# Patient Record
Sex: Female | Born: 1940
Health system: Southern US, Community
[De-identification: ages and names within clinical notes are randomized; demographics above are authoritative.]

## PROBLEM LIST (undated history)

## (undated) DIAGNOSIS — G20A1 Parkinson's disease without dyskinesia, without mention of fluctuations: Secondary | ICD-10-CM

## (undated) DIAGNOSIS — M159 Polyosteoarthritis, unspecified: Secondary | ICD-10-CM

## (undated) DIAGNOSIS — F0390 Unspecified dementia without behavioral disturbance: Secondary | ICD-10-CM

## (undated) DIAGNOSIS — I10 Essential (primary) hypertension: Secondary | ICD-10-CM

## (undated) DIAGNOSIS — K219 Gastro-esophageal reflux disease without esophagitis: Secondary | ICD-10-CM

## (undated) DIAGNOSIS — G20 Parkinson's disease: Secondary | ICD-10-CM

## (undated) DIAGNOSIS — H409 Unspecified glaucoma: Secondary | ICD-10-CM

## (undated) HISTORY — DX: Essential (primary) hypertension: I10

## (undated) HISTORY — DX: Unspecified glaucoma: H40.9

## (undated) HISTORY — DX: Unspecified dementia, unspecified severity, without behavioral disturbance, psychotic disturbance, mood disturbance, and anxiety: F03.90

## (undated) HISTORY — DX: Gastro-esophageal reflux disease without esophagitis: K21.9

## (undated) HISTORY — DX: Polyosteoarthritis, unspecified: M15.9

---

## 2011-03-13 ENCOUNTER — Ambulatory Visit: Payer: Self-pay | Admitting: Otolaryngology

## 2011-10-09 ENCOUNTER — Ambulatory Visit: Payer: Self-pay | Admitting: Otolaryngology

## 2011-10-11 ENCOUNTER — Ambulatory Visit: Payer: Self-pay | Admitting: Otolaryngology

## 2011-10-11 LAB — TSH: Thyroid Stimulating Horm: 0.368 u[IU]/mL — ABNORMAL LOW

## 2011-10-11 LAB — T4, FREE: Free Thyroxine: 0.98 ng/dL (ref 0.76–1.46)

## 2011-12-09 ENCOUNTER — Ambulatory Visit: Payer: Self-pay | Admitting: Family Medicine

## 2012-05-14 ENCOUNTER — Ambulatory Visit: Payer: Self-pay | Admitting: Otolaryngology

## 2012-05-14 LAB — TSH: Thyroid Stimulating Horm: 0.723 u[IU]/mL

## 2012-11-03 ENCOUNTER — Ambulatory Visit: Payer: Self-pay

## 2012-11-03 LAB — COMPREHENSIVE METABOLIC PANEL
Albumin: 4 g/dL (ref 3.4–5.0)
Alkaline Phosphatase: 71 U/L (ref 50–136)
Anion Gap: 7 (ref 7–16)
BUN: 16 mg/dL (ref 7–18)
Bilirubin,Total: 0.6 mg/dL (ref 0.2–1.0)
Calcium, Total: 9 mg/dL (ref 8.5–10.1)
Chloride: 103 mmol/L (ref 98–107)
Co2: 32 mmol/L (ref 21–32)
Creatinine: 0.87 mg/dL (ref 0.60–1.30)
EGFR (African American): >60
EGFR (Non-African Amer.): >60
Potassium: 3.9 mmol/L (ref 3.5–5.1)
Sodium: 142 mmol/L (ref 136–145)

## 2012-11-03 LAB — CBC WITH DIFFERENTIAL/PLATELET
Basophil #: 0 10*3/uL (ref 0.0–0.1)
Basophil %: 0.4 %
Eosinophil %: 0.3 %
Lymphocyte %: 8.8 %
MCH: 31.4 pg (ref 26.0–34.0)
MCHC: 34.2 g/dL (ref 32.0–36.0)
Monocyte %: 2.3 %
Neutrophil #: 9.2 10*3/uL — ABNORMAL HIGH (ref 1.4–6.5)
Platelet: 176 10*3/uL (ref 150–440)
RBC: 4.68 10*6/uL (ref 3.80–5.20)
RDW: 13.5 % (ref 11.5–14.5)
WBC: 10.5 10*3/uL (ref 3.6–11.0)

## 2012-11-03 LAB — URINALYSIS, COMPLETE
Bilirubin,UR: NEGATIVE
Blood: NEGATIVE
Glucose,UR: NEGATIVE mg/dL (ref 0–75)
Ketone: NEGATIVE
Nitrite: NEGATIVE
Ph: 8 (ref 4.5–8.0)
Protein: 30
Specific Gravity: 1.01 (ref 1.003–1.030)

## 2012-11-03 LAB — AMYLASE: Amylase: 90 U/L (ref 25–115)

## 2012-11-03 LAB — OCCULT BLOOD X 1 CARD TO LAB, STOOL: Occult Blood, Feces: NEGATIVE

## 2012-11-03 LAB — LIPASE, BLOOD: Lipase: 226 U/L (ref 73–393)

## 2013-11-01 ENCOUNTER — Ambulatory Visit: Payer: Self-pay | Admitting: Emergency Medicine

## 2013-11-01 LAB — CBC WITH DIFFERENTIAL/PLATELET
Basophil #: 0 10*3/uL (ref 0.0–0.1)
Basophil %: 0.4 %
Eosinophil #: 0 10*3/uL (ref 0.0–0.7)
Eosinophil %: 0.4 %
HCT: 42.6 % (ref 35.0–47.0)
HGB: 14.6 g/dL (ref 12.0–16.0)
Lymphocyte #: 1.3 10*3/uL (ref 1.0–3.6)
Lymphocyte %: 14.4 %
MCH: 32 pg (ref 26.0–34.0)
MCHC: 34.2 g/dL (ref 32.0–36.0)
MCV: 94 fL (ref 80–100)
Monocyte #: 0.4 x10 3/mm (ref 0.2–0.9)
Monocyte %: 4.8 %
Neutrophil #: 7.4 10*3/uL — ABNORMAL HIGH (ref 1.4–6.5)
Neutrophil %: 80 %
Platelet: 185 10*3/uL (ref 150–440)
RBC: 4.54 10*6/uL (ref 3.80–5.20)
RDW: 13.5 % (ref 11.5–14.5)
WBC: 9.3 10*3/uL (ref 3.6–11.0)

## 2013-11-01 LAB — URINALYSIS, COMPLETE
Bilirubin,UR: NEGATIVE
GLUCOSE, UR: NEGATIVE mg/dL (ref 0–75)
Ketone: NEGATIVE
Nitrite: NEGATIVE
PH: 6 (ref 4.5–8.0)
Protein: 300
SQUAMOUS EPITHELIAL: NONE SEEN
Specific Gravity: >=1.03 (ref 1.003–1.030)
WBC UR: >30 /HPF (ref 0–5)

## 2013-11-01 LAB — BASIC METABOLIC PANEL
Anion Gap: 7 (ref 7–16)
BUN: 18 mg/dL (ref 7–18)
Calcium, Total: 9.3 mg/dL (ref 8.5–10.1)
Chloride: 103 mmol/L (ref 98–107)
Co2: 32 mmol/L (ref 21–32)
Creatinine: 0.88 mg/dL (ref 0.60–1.30)
EGFR (African American): >60
EGFR (Non-African Amer.): >60
Glucose: 106 mg/dL — ABNORMAL HIGH (ref 65–99)
Osmolality: 285 (ref 275–301)
Potassium: 3.7 mmol/L (ref 3.5–5.1)
Sodium: 142 mmol/L (ref 136–145)

## 2013-11-03 LAB — URINE CULTURE

## 2014-03-12 ENCOUNTER — Ambulatory Visit: Payer: Self-pay | Admitting: Medical

## 2014-03-12 LAB — URINALYSIS, COMPLETE
Bilirubin,UR: NEGATIVE
GLUCOSE, UR: NEGATIVE
KETONE: NEGATIVE
Nitrite: NEGATIVE
Ph: 7 (ref 5.0–8.0)
Protein: 30
Specific Gravity: 1.02 (ref 1.000–1.030)

## 2014-03-13 LAB — URINE CULTURE

## 2015-08-10 DIAGNOSIS — H401121 Primary open-angle glaucoma, left eye, mild stage: Secondary | ICD-10-CM | POA: Diagnosis not present

## 2015-08-10 DIAGNOSIS — H401112 Primary open-angle glaucoma, right eye, moderate stage: Secondary | ICD-10-CM | POA: Diagnosis not present

## 2015-09-14 DIAGNOSIS — R634 Abnormal weight loss: Secondary | ICD-10-CM | POA: Diagnosis not present

## 2015-09-14 DIAGNOSIS — K219 Gastro-esophageal reflux disease without esophagitis: Secondary | ICD-10-CM | POA: Insufficient documentation

## 2015-11-30 DIAGNOSIS — H401112 Primary open-angle glaucoma, right eye, moderate stage: Secondary | ICD-10-CM | POA: Diagnosis not present

## 2015-11-30 DIAGNOSIS — H401121 Primary open-angle glaucoma, left eye, mild stage: Secondary | ICD-10-CM | POA: Diagnosis not present

## 2016-02-29 ENCOUNTER — Observation Stay
Admission: EM | Admit: 2016-02-29 | Discharge: 2016-03-02 | Disposition: A | Discharge status: Home / Self Care | Payer: Medicare Other | Attending: Internal Medicine | Admitting: Internal Medicine

## 2016-02-29 ENCOUNTER — Emergency Department: Payer: Medicare Other

## 2016-02-29 ENCOUNTER — Encounter: Payer: Self-pay | Admitting: Emergency Medicine

## 2016-02-29 DIAGNOSIS — Z818 Family history of other mental and behavioral disorders: Secondary | ICD-10-CM | POA: Insufficient documentation

## 2016-02-29 DIAGNOSIS — Z9102 Food additives allergy status: Secondary | ICD-10-CM | POA: Diagnosis not present

## 2016-02-29 DIAGNOSIS — I951 Orthostatic hypotension: Principal | ICD-10-CM | POA: Insufficient documentation

## 2016-02-29 DIAGNOSIS — R55 Syncope and collapse: Secondary | ICD-10-CM | POA: Diagnosis present

## 2016-02-29 DIAGNOSIS — I10 Essential (primary) hypertension: Secondary | ICD-10-CM | POA: Diagnosis not present

## 2016-02-29 DIAGNOSIS — Z886 Allergy status to analgesic agent status: Secondary | ICD-10-CM | POA: Insufficient documentation

## 2016-02-29 DIAGNOSIS — Z91013 Allergy to seafood: Secondary | ICD-10-CM | POA: Diagnosis not present

## 2016-02-29 DIAGNOSIS — Z825 Family history of asthma and other chronic lower respiratory diseases: Secondary | ICD-10-CM | POA: Diagnosis not present

## 2016-02-29 DIAGNOSIS — Z888 Allergy status to other drugs, medicaments and biological substances status: Secondary | ICD-10-CM | POA: Insufficient documentation

## 2016-02-29 DIAGNOSIS — K219 Gastro-esophageal reflux disease without esophagitis: Secondary | ICD-10-CM | POA: Diagnosis not present

## 2016-02-29 DIAGNOSIS — N39498 Other specified urinary incontinence: Secondary | ICD-10-CM | POA: Insufficient documentation

## 2016-02-29 DIAGNOSIS — R42 Dizziness and giddiness: Secondary | ICD-10-CM | POA: Diagnosis present

## 2016-02-29 DIAGNOSIS — Z8349 Family history of other endocrine, nutritional and metabolic diseases: Secondary | ICD-10-CM | POA: Diagnosis not present

## 2016-02-29 DIAGNOSIS — H409 Unspecified glaucoma: Secondary | ICD-10-CM | POA: Diagnosis not present

## 2016-02-29 DIAGNOSIS — E860 Dehydration: Secondary | ICD-10-CM | POA: Diagnosis not present

## 2016-02-29 LAB — CBC WITH DIFFERENTIAL/PLATELET
BASOS ABS: 0 10*3/uL (ref 0–0.1)
Basophils Relative: 0 %
Eosinophils Absolute: 0 10*3/uL (ref 0–0.7)
Eosinophils Relative: 0 %
HEMATOCRIT: 43.3 % (ref 35.0–47.0)
Hemoglobin: 15.1 g/dL (ref 12.0–16.0)
LYMPHS ABS: 1.8 10*3/uL (ref 1.0–3.6)
LYMPHS PCT: 19 %
MCH: 32.4 pg (ref 26.0–34.0)
MCHC: 34.8 g/dL (ref 32.0–36.0)
MCV: 93.1 fL (ref 80.0–100.0)
MONO ABS: 0.3 10*3/uL (ref 0.2–0.9)
Monocytes Relative: 3 %
NEUTROS ABS: 7.4 10*3/uL — AB (ref 1.4–6.5)
Neutrophils Relative %: 78 %
Platelets: 211 10*3/uL (ref 150–440)
RBC: 4.65 MIL/uL (ref 3.80–5.20)
RDW: 13.1 % (ref 11.5–14.5)
WBC: 9.6 10*3/uL (ref 3.6–11.0)

## 2016-02-29 LAB — BASIC METABOLIC PANEL
Anion gap: 9 (ref 5–15)
BUN: 19 mg/dL (ref 6–20)
CHLORIDE: 105 mmol/L (ref 101–111)
CO2: 27 mmol/L (ref 22–32)
CREATININE: 0.77 mg/dL (ref 0.44–1.00)
Calcium: 10.4 mg/dL — ABNORMAL HIGH (ref 8.9–10.3)
GFR calc Af Amer: >60 mL/min (ref >60)
GFR calc non Af Amer: >60 mL/min (ref >60)
GLUCOSE: 124 mg/dL — AB (ref 65–99)
POTASSIUM: 4.1 mmol/L (ref 3.5–5.1)
Sodium: 141 mmol/L (ref 135–145)

## 2016-02-29 LAB — URINALYSIS COMPLETE WITH MICROSCOPIC (ARMC ONLY)
BACTERIA UA: NONE SEEN
BILIRUBIN URINE: NEGATIVE
Glucose, UA: NEGATIVE mg/dL
Hgb urine dipstick: NEGATIVE
Ketones, ur: NEGATIVE mg/dL
LEUKOCYTES UA: NEGATIVE
Nitrite: NEGATIVE
PH: 9 — AB (ref 5.0–8.0)
PROTEIN: NEGATIVE mg/dL
Specific Gravity, Urine: 1.008 (ref 1.005–1.030)

## 2016-02-29 LAB — TROPONIN I: Troponin I: <0.03 ng/mL (ref <0.03)

## 2016-02-29 LAB — GLUCOSE, CAPILLARY: GLUCOSE-CAPILLARY: 168 mg/dL — AB (ref 65–99)

## 2016-02-29 LAB — ETHANOL

## 2016-02-29 MED ORDER — ONDANSETRON HCL 4 MG/2ML IJ SOLN
4.0000 mg | Freq: Four times a day (QID) | INTRAMUSCULAR | Status: DC | PRN
Start: 2016-02-29 — End: 2016-03-02

## 2016-02-29 MED ORDER — ONDANSETRON HCL 4 MG/2ML IJ SOLN
4.0000 mg | Freq: Once | INTRAMUSCULAR | Status: AC
  Administered 2016-02-29: 4 mg via INTRAVENOUS

## 2016-02-29 MED ORDER — HEPARIN SODIUM (PORCINE) 5000 UNIT/ML IJ SOLN
5000.0000 [IU] | Freq: Three times a day (TID) | INTRAMUSCULAR | Status: DC
  Administered 2016-02-29 – 2016-03-02 (×5): 5000 [IU] via SUBCUTANEOUS
  Filled 2016-02-29 (×5): qty 1

## 2016-02-29 MED ORDER — ONDANSETRON HCL 4 MG PO TABS: 4.0000 mg | ORAL_TABLET | Freq: Four times a day (QID) | ORAL | Status: DC | PRN

## 2016-02-29 MED ORDER — SODIUM CHLORIDE 0.9 % IV BOLUS (SEPSIS)
1000.0000 mL | Freq: Once | INTRAVENOUS | Status: AC
  Administered 2016-02-29: 1000 mL via INTRAVENOUS

## 2016-02-29 MED ORDER — ACETAMINOPHEN 650 MG RE SUPP: 650.0000 mg | Freq: Four times a day (QID) | RECTAL | Status: DC | PRN

## 2016-02-29 MED ORDER — LATANOPROST 0.005 % OP SOLN: 1.0000 [drp] | Freq: Every day | OPHTHALMIC | Status: DC

## 2016-02-29 MED ORDER — TRAZODONE HCL 50 MG PO TABS
25.0000 mg | ORAL_TABLET | Freq: Every evening | ORAL | Status: DC | PRN
  Filled 2016-02-29: qty 1

## 2016-02-29 MED ORDER — DORZOLAMIDE HCL-TIMOLOL MAL 2-0.5 % OP SOLN
1.0000 [drp] | Freq: Two times a day (BID) | OPHTHALMIC | Status: DC
  Administered 2016-03-02: 1 [drp] via OPHTHALMIC
  Filled 2016-02-29: qty 10

## 2016-02-29 MED ORDER — ONDANSETRON HCL 4 MG/2ML IJ SOLN
INTRAMUSCULAR | Status: AC
  Administered 2016-02-29: 4 mg via INTRAVENOUS
  Filled 2016-02-29: qty 2

## 2016-02-29 MED ORDER — ACETAMINOPHEN 325 MG PO TABS
650.0000 mg | ORAL_TABLET | Freq: Four times a day (QID) | ORAL | Status: DC | PRN
  Administered 2016-02-29 – 2016-03-01 (×2): 650 mg via ORAL
  Filled 2016-02-29 (×2): qty 2

## 2016-02-29 MED ORDER — DOCUSATE SODIUM 100 MG PO CAPS
100.0000 mg | ORAL_CAPSULE | Freq: Two times a day (BID) | ORAL | Status: DC
  Filled 2016-02-29 (×4): qty 1

## 2016-02-29 MED ORDER — BISACODYL 5 MG PO TBEC: 5.0000 mg | DELAYED_RELEASE_TABLET | Freq: Every day | ORAL | Status: DC | PRN

## 2016-02-29 MED ORDER — SODIUM CHLORIDE 0.9 % IV SOLN
INTRAVENOUS | Status: DC
  Administered 2016-02-29: 23:00:00 via INTRAVENOUS

## 2016-02-29 MED ORDER — HYDROCODONE-ACETAMINOPHEN 5-325 MG PO TABS: 1.0000 | ORAL_TABLET | ORAL | Status: DC | PRN

## 2016-02-29 NOTE — ED Provider Notes (Addendum)
[facility] Emergency Department Provider Note  ____________________________________________  Time seen: Approximately 3:21 PM  I have reviewed the triage vital signs and the nursing notes.   HISTORY  Chief Complaint Loss of Consciousness    HPI Cheryl Cummings is a 75 y.o. female comes to the ED due to syncope at home. She reports that she remembers she was seated on the floor brushing her cat, And then she woke up lying on her face on her glasses.She denies any preceding symptoms such as headache chest pain back pain abdominal pain or vomiting. She denies headache chest pain abdominal pain or shortness of breath now. She only reports some nausea and feeling very fatigued. She was incontinent of urine during the syncope at home. Here in the ED she reported that she felt like she needed to urinate again, and she had a syncopal episode on the toilet while nurses were assisting her. She did not fall or hit her head.     History reviewed. No pertinent past medical history. None  There are no active problems to display for this patient.    History reviewed. No pertinent surgical history. None  Prior to Admission medications   Medication Sig Start Date End Date Taking? Authorizing Provider  dorzolamide-timolol (COSOPT) 22.3-6.8 MG/ML ophthalmic solution Place 1 drop into both eyes 2 (two) times daily.   Yes Historical Provider, MD  TRAVATAN Z 0.004 % SOLN ophthalmic solution Place 1 drop into both eyes at bedtime.   Yes Historical Provider, MD  None Allergies Ibuprofen; Mushroom extract complex; Valium [diazepam]; and Shellfish allergy   History reviewed. No pertinent family history.  Social History Social History  Substance Use Topics  . Smoking status: Never Smoker  . Smokeless tobacco: Never Used  . Alcohol use No  No tobacco alcohol or drug use  Review of Systems  Constitutional:   No fever or chills.  ENT:   No sore throat. No  rhinorrhea. Cardiovascular:   No chest pain. Respiratory:   No dyspnea or cough. Gastrointestinal:   Negative for abdominal pain, vomiting and diarrhea.  Genitourinary:   Negative for dysuria or difficulty urinating. Musculoskeletal:   Negative for focal pain or swelling Neurological:   Negative for headaches 10-point ROS otherwise negative.  ____________________________________________   PHYSICAL EXAM:  VITAL SIGNS: ED Triage Vitals  Enc Vitals Group     BP      Pulse      Resp      Temp      Temp src      SpO2      Weight      Height      Head Circumference      Peak Flow      Pain Score      Pain Loc      Pain Edu?      Excl. in GC?   Blood pressure 215/110 Heart rate 60 Oxygen saturation 100% Respiratory rate 18  Vital signs reviewed, nursing assessments reviewed.   Constitutional:   Alert and oriented. Well appearing and in no distress. Eyes:   No scleral icterus. No conjunctival pallor. PERRL. EOMI.  No nystagmus. ENT   Head:   Normocephalic and atraumatic.   Nose:   No congestion/rhinnorhea. No septal hematoma   Mouth/Throat:   Dry mucous membranes, no pharyngeal erythema. No peritonsillar mass.    Neck:   No stridor. No SubQ emphysema. No meningismus. Hematological/Lymphatic/Immunilogical:   No cervical lymphadenopathy. Cardiovascular:   RRR.  Symmetric bilateral radial and DP pulses.  No murmurs.  Respiratory:   Normal respiratory effort without tachypnea nor retractions. Breath sounds are clear and equal bilaterally. No wheezes/rales/rhonchi. Gastrointestinal:   Soft and nontender. Non distended. There is no CVA tenderness.  No rebound, rigidity, or guarding. Genitourinary:   deferred Musculoskeletal:   Nontender with normal range of motion in all extremities. No joint effusions.  No lower extremity tenderness.  No edema. Neurologic:   Normal speech and language.  CN 2-10 normal. Motor grossly intact. No gross focal neurologic deficits are  appreciated.  Skin:    Skin is warm, dry and intact. No rash noted.  No petechiae, purpura, or bullae.  ____________________________________________    LABS (pertinent positives/negatives) (all labs ordered are listed, but only abnormal results are displayed) Labs Reviewed  BASIC METABOLIC PANEL - Abnormal; Notable for the following:       Result Value   Glucose, Bld 124 (*)    Calcium 10.4 (*)    All other components within normal limits  CBC WITH DIFFERENTIAL/PLATELET - Abnormal; Notable for the following:    Neutro Abs 7.4 (*)    All other components within normal limits  URINALYSIS COMPLETEWITH MICROSCOPIC ([facility] ONLY) - Abnormal; Notable for the following:    Color, Urine YELLOW (*)    APPearance CLOUDY (*)    pH 9.0 (*)    Squamous Epithelial / LPF 0-5 (*)    All other components within normal limits  ETHANOL   ____________________________________________   EKG  Interpreted by me Normal sinus rhythm rate of 66. Normal axis intervals QRS ST segments and T waves.  ____________________________________________    RADIOLOGY  CT head unremarkable  ____________________________________________   PROCEDURES Procedures  ____________________________________________   INITIAL IMPRESSION / ASSESSMENT AND PLAN / ED COURSE  Pertinent labs & imaging results that were available during my care of the patient were reviewed by me and considered in my medical decision making (see chart for details).  Syncope, possible new onset seizure. Severe hypertension and nausea are the only significant findings on exam and by history. We'll get CT head to evaluate for intracranial hemorrhage versus structural lesion, check labs EKG, give IV fluids and Zofran.   ----------------------------------------- 6:47 PM on 02/29/2016 -----------------------------------------  Patient not feeling better and in fact feeling worse. When attempting to ambulate the patient feels very dizzy and  becomes near syncopal again. She also is very low energy.  We'll discuss with hospitalist for further evaluation. Continue IV fluids    Clinical Course   ____________________________________________   FINAL CLINICAL IMPRESSION(S) / ED DIAGNOSES  Final diagnoses:  Syncope, unspecified syncope type  Dehydration       Portions of this note were generated with dragon dictation software. Dictation errors may occur despite best attempts at proofreading.    Cheryl CheekPhillip Jkayla Spiewak, MD 02/29/16 1847    Cheryl CheekPhillip Karinna Beadles, MD 02/29/16 [phone]

## 2016-02-29 NOTE — ED Notes (Signed)
Report given to floor RN. Pt taken to floor via stretcher. Vital signs stable prior to transport.  

## 2016-02-29 NOTE — ED Triage Notes (Signed)
Pt arrived via EMS from home. Pt sitting on the floor brushing her cat and the next thing she knew she was laying on the floor partially laying on her glasses. Pt unsure how long she was unconscious or what happened, pt did have loss of bladder control. Pt was able to get herself off the floor, changed and called 911. Upon arrival to ER pt has no complaints, alert and oriented. Just after to ER, pt needed to use bathroom, helped to in room toilet by two RNs. After using bathroom, pt stood up, stated that she felt dizzy, sat back down on toilet, then became unresponsive but was still breathing. With assistance of three RNs, pt moved onto stretcher and MD notified. After several minutes, pt came back around and stated that she felt like she was going to pass out. MD to bedside to assess pt, pt alert and oriented, denies any pain, just states, "I feel drained."

## 2016-02-29 NOTE — H&P (Signed)
[facility] [place] [facility]   PATIENT NAME: Cheryl Cummings    MR#:  [id number]  DATE OF BIRTH:  08-22-1940  DATE OF ADMISSION:  02/29/2016  PRIMARY CARE PHYSICIAN: [facility]   REQUESTING/REFERRING PHYSICIAN;Dr.Philip Scotty CourtStafford  CHIEF COMPLAINT;syncope   Chief Complaint  Patient presents with  . Loss of Consciousness    HISTORY OF PRESENT ILLNESS:  Cheryl Cummings  is a 75 y.o. female with no ST medical problems comes in because of syncope. Patient passed out at home while she was brushing the cat, she called her daughter and she called ambulance. In the emergency room she passed out again. No chest pain or dizziness. Patient has fatigue, nausea. Patient had episodes of syncope and  incontinence at home. No recent illnesses. No appetite problems. But the daughter mentioned that she is losing a lot of weight recently. Patient lives alone.   PAST MEDICAL HISTORY:  History reviewed. No pertinent past medical history.  PAST SURGICAL HISTOIRY:  History reviewed. No pertinent surgical history.  SOCIAL HISTORY:   Social History  Substance Use Topics  . Smoking status: Never Smoker  . Smokeless tobacco: Never Used  . Alcohol use No    FAMILY HISTORY:  History reviewed. No pertinent family history.  DRUG ALLERGIES:   Allergies  Allergen Reactions  . Ibuprofen Swelling  . Mushroom Extract Complex Nausea And Vomiting  . Valium [Diazepam] Other (See Comments)    Unknown   . Shellfish Allergy Rash    REVIEW OF SYSTEMS:  CONSTITUTIONAL: No fever, fatigue or weakness. appears malnourished. EYES: No blurred or double vision.  EARS, NOSE, AND THROAT: No tinnitus or ear pain.  RESPIRATORY: No cough, shortness of breath, wheezing or hemoptysis.  CARDIOVASCULAR: No chest pain, orthopnea, edema.  GASTROINTESTINAL: No nausea, vomiting, diarrhea or abdominal pain.  GENITOURINARY: No dysuria, hematuria.  ENDOCRINE: No polyuria,  nocturia,  HEMATOLOGY: No anemia, easy bruising or bleeding SKIN: No rash or lesion. MUSCULOSKELETAL: No joint pain or arthritis.   NEUROLOGIC: No tingling, numbness, weakness.  PSYCHIATRY: No anxiety or depression.   MEDICATIONS AT HOME:   Prior to Admission medications   Medication Sig Start Date End Date Taking? Authorizing Provider  dorzolamide-timolol (COSOPT) 22.3-6.8 MG/ML ophthalmic solution Place 1 drop into both eyes 2 (two) times daily.   Yes Historical Provider, MD  TRAVATAN Z 0.004 % SOLN ophthalmic solution Place 1 drop into both eyes at bedtime.   Yes Historical Provider, MD      VITAL SIGNS:  Blood pressure 136/75, pulse 92, temperature 98.1 F (36.7 C), temperature source Oral, resp. rate 13, height 5\' 4"  (1.626 m), weight 48.1 kg (106 lb), SpO2 100 %.  PHYSICAL EXAMINATION:  GENERAL:  75 y.o.-year-old patient lying in the bed with no acute distress.  EYES: Pupils equal, round, reactive to light and accommodation. No scleral icterus. Extraocular muscles intact.  HEENT: Head atraumatic, normocephalic. Oropharynx and nasopharynx clear.  NECK:  Supple, no jugular venous distention. No thyroid enlargement, no tenderness.  LUNGS: Normal breath sounds bilaterally, no wheezing, rales,rhonchi or crepitation. No use of accessory muscles of respiration.  CARDIOVASCULAR: S1, S2 normal. No murmurs, rubs, or gallops.  ABDOMEN: Soft, nontender, nondistended. Bowel sounds present. No organomegaly or mass.  EXTREMITIES: No pedal edema, cyanosis, or clubbing.  NEUROLOGIC: Cranial nerves II through XII are intact. Muscle strength 5/5 in all extremities. Sensation intact. Gait not checked.  PSYCHIATRIC: The patient is alert and oriented x 3.  SKIN: No obvious rash,  lesion, or ulcer.   LABORATORY PANEL:   CBC  Recent Labs Lab 02/29/16 1526  WBC 9.6  HGB 15.1  HCT 43.3  PLT 211    ------------------------------------------------------------------------------------------------------------------  Chemistries   Recent Labs Lab 02/29/16 1526  NA 141  K 4.1  CL 105  CO2 27  GLUCOSE 124*  BUN 19  CREATININE 0.77  CALCIUM 10.4*   ------------------------------------------------------------------------------------------------------------------  Cardiac Enzymes No results for input(s): TROPONINI in the last 168 hours. ------------------------------------------------------------------------------------------------------------------  RADIOLOGY:  Ct Head Wo Contrast  Result Date: 02/29/2016 CLINICAL DATA:  Syncopal episode. EXAM: CT HEAD WITHOUT CONTRAST TECHNIQUE: Contiguous axial images were obtained from the base of the skull through the vertex without intravenous contrast. COMPARISON:  None. FINDINGS: Brain: Moderate diffuse periventricular and subcortical white matter hypoattenuation is present bilaterally. Basal ganglia are intact. Insular ribbon is normal. No focal cortical lesions are present. No acute infarct, hemorrhage, or mass lesion is present. The ventricles are of normal size. No significant extraaxial fluid collection is present. Vascular: Atherosclerotic calcifications are noted within the precavernous and cavernous internal carotid arteries bilaterally. No hyperdense Skull: Calvarium is within normal limits. Skullbase is unremarkable. Sinuses/Orbits: The paranasal sinuses are clear. The mastoid air cells are clear. The globes and orbits are intact. Other: No significant extracranial soft tissue lesions are present. IMPRESSION: 1. Moderate diffuse periventricular and subcortical white matter hypoattenuation bilaterally likely reflects the sequela of chronic microvascular ischemia. 2. No acute intracranial abnormality. Electronically Signed   By: Marin Robertshristopher  Mattern M.D.   On: 02/29/2016 16:44    EKG:   Orders placed or performed during the hospital  encounter of 02/29/16  . ED EKG  . ED EKG  . EKG 12-Lead  . EKG 12-Lead    Sinus tachycardia with some PVCs 90 bpm    IMPRESSION AND PLAN:  Recurrent syncope UNCLEAR REASON: EVALUATE FOR CARDIAC ARRHYTHMIA,vs seizure vs orthostatic hypotension?  Admit, start  IV hydration, check echo, carotid  ultrasound, check orthostatic vitals. Cardiology consult requested because of recurrent syncope, neurology consult also requested because of recurrent syncope. Discussed this with patient's family.    Weight loss,possible malnutrition;consult nutritionist PT consult due to syncope    All the records are reviewed and case discussed with ED provider. Management plans discussed with the patient, family and they are in agreement.  CODE STATUS:full  TOTAL TIME TAKING CARE OF THIS PATIENT: 55minutes.    Katha HammingKONIDENA,Clariece Roesler M.D on 02/29/2016 at 8:36 PM  Between 7am to 6pm - Pager - [phone]  After 6pm go to [email or web address] - password [email or web address]  [facility]  Office  [phone]  CC: Primary care physician; [facility]  Note: This dictation was prepared with Dragon dictation along with smaller phrase technology. Any transcriptional errors that result from this process are unintentional.

## 2016-03-01 ENCOUNTER — Observation Stay: Payer: Medicare Other

## 2016-03-01 ENCOUNTER — Observation Stay (HOSPITAL_COMMUNITY): Payer: Medicare Other

## 2016-03-01 ENCOUNTER — Observation Stay
Admit: 2016-03-01 | Discharge: 2016-03-01 | Disposition: A | Discharge status: Home / Self Care | Payer: Medicare Other | Attending: Internal Medicine | Admitting: Internal Medicine

## 2016-03-01 DIAGNOSIS — R55 Syncope and collapse: Secondary | ICD-10-CM

## 2016-03-01 DIAGNOSIS — I951 Orthostatic hypotension: Secondary | ICD-10-CM | POA: Diagnosis not present

## 2016-03-01 LAB — BASIC METABOLIC PANEL
ANION GAP: 6 (ref 5–15)
BUN: 17 mg/dL (ref 6–20)
CALCIUM: 9.1 mg/dL (ref 8.9–10.3)
CO2: 29 mmol/L (ref 22–32)
Chloride: 106 mmol/L (ref 101–111)
Creatinine, Ser: 0.64 mg/dL (ref 0.44–1.00)
GLUCOSE: 93 mg/dL (ref 65–99)
POTASSIUM: 3.5 mmol/L (ref 3.5–5.1)
Sodium: 141 mmol/L (ref 135–145)

## 2016-03-01 LAB — CBC
HEMATOCRIT: 38.3 % (ref 35.0–47.0)
HEMOGLOBIN: 13.5 g/dL (ref 12.0–16.0)
MCH: 32.9 pg (ref 26.0–34.0)
MCHC: 35.3 g/dL (ref 32.0–36.0)
MCV: 93.1 fL (ref 80.0–100.0)
Platelets: 173 10*3/uL (ref 150–440)
RBC: 4.11 MIL/uL (ref 3.80–5.20)
RDW: 13.1 % (ref 11.5–14.5)
WBC: 6.5 10*3/uL (ref 3.6–11.0)

## 2016-03-01 LAB — TROPONIN I

## 2016-03-01 LAB — ECHOCARDIOGRAM COMPLETE
HEIGHTINCHES: 64 in
Weight: 1646.4 oz

## 2016-03-01 MED ORDER — GADOBENATE DIMEGLUMINE 529 MG/ML IV SOLN
10.0000 mL | Freq: Once | INTRAVENOUS | Status: AC | PRN
  Administered 2016-03-01: 9 mL via INTRAVENOUS

## 2016-03-01 NOTE — Evaluation (Signed)
Physical Therapy Evaluation Patient Details Name: Cheryl Cummings MRN: [id number] DOB: [birth year] Today's Date: 03/01/2016   History of Present Illness  Pt is a 75 yr old female who was sitting on the floor brushing her cat when she passed out; remembers nothing just woke up laying on her glassess and having urinated on herself. She passed out again once in the ED. CT head and MRI brain with no acute abnormality. PMH unremarkable  Clinical Impression  Prior to admission, pt was independent with all functional mobility and ADLs.  Pt lives alone in a split-level home with 7 steps inside and 3 STE.  Currently, pt is min guard-min A for bed mobility, min A for transfers, and min A for ambulation 2x713ft with R handheld assist.  Pt presents with asymptomatic orthostatic hypotension noted by blood pressures as follows: supine 151/75, [facility], back in supine 154/79.  RN notified of orthostatics.  Pt presents with generalized weakness and a strong posterior lean in standing resulting in decreased independence with all functional mobility.  Pt would benefit from skilled PT to address noted impairments and functional limitations.  Pending progress with acute PT, recommend pt discharge to SNF when medically appropriate.     Follow Up Recommendations SNF (pending progress)    Equipment Recommendations  To be determined   Recommendations for Other Services       Precautions / Restrictions Precautions Precautions: Fall Restrictions Weight Bearing Restrictions: No      Mobility  Bed Mobility Overal bed mobility: Needs Assistance Bed Mobility: Supine to Sit;Sit to Supine Supine to sit: Min guard Sit to supine: Min assist;+2 for physical assistance General bed mobility comments: Pt min guard for supine > sit with increased time and use of bed rails. Min A x2 for sit > supine d/t urgency to go to procedure.  Transfers Overall transfer level: Needs  assistance Equipment used: 1 person hand held assist Transfers: Sit to/from Stand (x 2 trials) Sit to Stand: Min assist General transfer comment: Pt stands with weight distrubted posteriorly through heels, requires max cueing and min A to recover and prevent posterior LOB. Decreased extension throughout. BUE assist and increased time required. Pt denies using AD at home, but reaches for PTs hand to assist up to stand.   Ambulation/Gait Ambulation/Gait assistance: Min assist Ambulation Distance (Feet):  (2 x 763ft) Assistive device: 1 person hand held assist Gait Pattern/deviations: Step-to pattern;Decreased stride length;Leaning posteriorly;Narrow base of support Gait velocity: Decreased General Gait Details: Pt performs ambulatory transfers 2x693ft from bed <> BSC with R handheld assist and min A. Pt with posterior lean with minimal correction with heavy verbal and tactile cueing; min A required to prevent posterior LOB. Pt's gait is very segmented and unstable. Unable to further progress gait as pt needing to go down for procedure.   Stairs      Balance Overall balance assessment: Needs assistance Sitting-balance support: Bilateral upper extremity supported;Feet supported Sitting balance-Leahy Scale: Good   Postural control: Posterior lean Standing balance support: Single extremity supported Standing balance-Leahy Scale: Poor     Pertinent Vitals/Pain Pain Assessment: No/denies pain  HR and O2 monitored throughout session and maintained WFL.    Home Living Family/patient expects to be discharged to:: Private residence Living Arrangements: Alone Type of Home: House Home Access: Stairs to enter Entrance Stairs-Rails: None Entrance Stairs-Number of Steps: 2 Home Layout: Multi-level Alternate Level Stairs: 7 Alternate Level Rails: can reach both   Prior Function Level of Independence:  Independent         Extremity/Trunk Assessment   Upper Extremity Assessment: Generalized  weakness  Lower Extremity Assessment: Generalized weakness Strength at least 3/5 throughout, light touch sensation throughout.  Cervical / Trunk Assessment: Normal    Communication   Communication: No difficulties  Cognition Arousal/Alertness: Awake/alert Behavior During Therapy: WFL for tasks assessed/performed Overall Cognitive Status: Within Functional Limits for tasks assessed    General Comments Nursing cleared pt for participation in physical therapy.  Pt agreeable to PT session. Session limited by transport arriving to take pt off unit for procedure.            Assessment/Plan    PT Assessment Patient needs continued PT services  PT Diagnosis Difficulty walking;Generalized weakness   PT Problem List Decreased balance;Decreased mobility;Decreased knowledge of use of DME;Decreased strength  PT Treatment Interventions DME instruction;Gait training;Stair training;Therapeutic activities;Functional mobility training;Therapeutic exercise;Balance training;Patient/family education   PT Goals (Current goals can be found in the Care Plan section) Acute Rehab PT Goals Patient Stated Goal: To get feeling better PT Goal Formulation: With patient Time For Goal Achievement: 03/15/16 Potential to Achieve Goals: Good    Frequency Min 2X/week   Barriers to discharge Decreased caregiver support (pt lives alone)         End of Session Equipment Utilized During Treatment: Gait belt Activity Tolerance: Patient tolerated treatment well Patient left: in bed;with bed alarm set (transport in room taking pt off unit) Nurse Communication: Mobility status;Precautions         Time: 1410-1433 PT Time Calculation (min) (ACUTE ONLY): 23 min   Charges:         PT G Codes:        Caroleann Casler, SPT 03/01/2016, 4:09 PM

## 2016-03-01 NOTE — Progress Notes (Signed)
PT Cancellation Note  Patient Details Name: Cheryl Cummings MRN: [id number] DOB: [birth year]   Cancelled Treatment:    Reason Eval/Treat Not Completed: Patient at procedure or test/unavailable. Order received, chart reviewed. Upon arrival, neurologist in room and another medical professional outside waiting to see pt. Will re-attempt PT eval at later date/time.   Earnie Bechard, SPT  03/01/2016, 10:24 AM

## 2016-03-01 NOTE — Progress Notes (Addendum)
[facility] - [place] [facility]   PATIENT NAME: Cheryl Cummings    MR#:  [id number]  DATE OF BIRTH:  May 17, 1941  SUBJECTIVE:   Feels better today. Not sure what happended y'day but had passed out x2 REVIEW OF SYSTEMS:   Review of Systems  Constitutional: Negative for chills, fever and weight loss.  HENT: Negative for ear discharge, ear pain and nosebleeds.   Eyes: Negative for blurred vision, pain and discharge.  Respiratory: Negative for sputum production, shortness of breath, wheezing and stridor.   Cardiovascular: Negative for chest pain, palpitations, orthopnea and PND.  Gastrointestinal: Negative for abdominal pain, diarrhea, nausea and vomiting.  Genitourinary: Negative for frequency and urgency.  Musculoskeletal: Negative for back pain and joint pain.  Neurological: Positive for weakness. Negative for sensory change, speech change and focal weakness.  Psychiatric/Behavioral: Negative for depression and hallucinations. The patient is not nervous/anxious.   All other systems reviewed and are negative.  Tolerating Diet:yes Tolerating PT: pending  DRUG ALLERGIES:   Allergies  Allergen Reactions  . Ibuprofen Swelling  . Mushroom Extract Complex Nausea And Vomiting  . Valium [Diazepam] Other (See Comments)    Unknown   . Shellfish Allergy Rash    VITALS:  Blood pressure 131/65, pulse 82, temperature 98.5 F (36.9 C), temperature source Oral, resp. rate 18, height 5\' 4"  (1.626 m), weight 102 lb 14.4 oz (46.7 kg), SpO2 98 %.  PHYSICAL EXAMINATION:   Physical Exam  GENERAL:  75 y.o.-year-old patient lying in the bed with no acute distress.  EYES: Pupils equal, round, reactive to light and accommodation. No scleral icterus. Extraocular muscles intact.  HEENT: Head atraumatic, normocephalic. Oropharynx and nasopharynx clear.  NECK:  Supple, no jugular venous distention. No thyroid enlargement, no tenderness.  LUNGS: Normal breath sounds  bilaterally, no wheezing, rales, rhonchi. No use of accessory muscles of respiration.  CARDIOVASCULAR: S1, S2 normal. No murmurs, rubs, or gallops.  ABDOMEN: Soft, nontender, nondistended. Bowel sounds present. No organomegaly or mass.  EXTREMITIES: No cyanosis, clubbing or edema b/l.    NEUROLOGIC: Cranial nerves II through XII are intact. No focal Motor or sensory deficits b/l.   PSYCHIATRIC:  patient is alert and oriented x 3.  SKIN: No obvious rash, lesion, or ulcer.   LABORATORY PANEL:  CBC  Recent Labs Lab 03/01/16 0503  WBC 6.5  HGB 13.5  HCT 38.3  PLT 173    Chemistries   Recent Labs Lab 03/01/16 0503  NA 141  K 3.5  CL 106  CO2 29  GLUCOSE 93  BUN 17  CREATININE 0.64  CALCIUM 9.1   Cardiac Enzymes  Recent Labs Lab 03/01/16 0951  TROPONINI <0.03   RADIOLOGY:  Ct Head Wo Contrast  Result Date: 02/29/2016 CLINICAL DATA:  Syncopal episode. EXAM: CT HEAD WITHOUT CONTRAST TECHNIQUE: Contiguous axial images were obtained from the base of the skull through the vertex without intravenous contrast. COMPARISON:  None. FINDINGS: Brain: Moderate diffuse periventricular and subcortical white matter hypoattenuation is present bilaterally. Basal ganglia are intact. Insular ribbon is normal. No focal cortical lesions are present. No acute infarct, hemorrhage, or mass lesion is present. The ventricles are of normal size. No significant extraaxial fluid collection is present. Vascular: Atherosclerotic calcifications are noted within the precavernous and cavernous internal carotid arteries bilaterally. No hyperdense Skull: Calvarium is within normal limits. Skullbase is unremarkable. Sinuses/Orbits: The paranasal sinuses are clear. The mastoid air cells are clear. The globes and orbits are intact. Other: No significant extracranial  soft tissue lesions are present. IMPRESSION: 1. Moderate diffuse periventricular and subcortical white matter hypoattenuation bilaterally likely reflects  the sequela of chronic microvascular ischemia. 2. No acute intracranial abnormality. Electronically Signed   By: Marin Robertshristopher  Mattern M.D.   On: 02/29/2016 16:44   Mr Laqueta JeanBrain W ZOWo Contrast  Result Date: 03/01/2016 CLINICAL DATA:  Syncopal episode. EXAM: MRI HEAD WITHOUT AND WITH CONTRAST TECHNIQUE: Multiplanar, multiecho pulse sequences of the brain and surrounding structures were obtained without and with intravenous contrast. CONTRAST:  9mL MULTIHANCE GADOBENATE DIMEGLUMINE 529 MG/ML IV SOLN COMPARISON:  Head CT 02/29/2016 FINDINGS: Diffusion imaging does not show any acute or subacute infarction. There chronic small-vessel ischemic changes affecting the pons. No focal cerebellar insult. Cerebral hemispheres show moderate to marked chronic small-vessel ischemic changes affecting the deep and subcortical white matter. No cortical or large vessel territory infarction. No mass lesion, hemorrhage, hydrocephalus or extra-axial collection. No pituitary mass. No inflammatory sinus disease. No skull or skullbase lesion. IMPRESSION: No acute finding. Extensive chronic small-vessel ischemic changes affecting the cerebral hemispheric white matter. Electronically Signed   By: Paulina FusiMark  Shogry M.D.   On: 03/01/2016 15:38   [place] Carotid Bilateral  Result Date: 03/01/2016 CLINICAL DATA:  Syncope. EXAM: BILATERAL CAROTID DUPLEX ULTRASOUND TECHNIQUE: Wallace CullensGray scale imaging, color Doppler and duplex ultrasound were performed of bilateral carotid and vertebral arteries in the neck. COMPARISON:  None. FINDINGS: Criteria: Quantification of carotid stenosis is based on velocity parameters that correlate the residual internal carotid diameter with NASCET-based stenosis levels, using the diameter of the distal internal carotid lumen as the denominator for stenosis measurement. The following velocity measurements were obtained: RIGHT ICA:  33/9 cm/sec CCA:  84/14 cm/sec SYSTOLIC ICA/CCA RATIO:  0.4 DIASTOLIC ICA/CCA RATIO:  0.6 ECA:  62  cm/sec LEFT ICA:  33/10 cm/sec CCA:  62/13 cm/sec SYSTOLIC ICA/CCA RATIO:  0.5 DIASTOLIC ICA/CCA RATIO:  0.8 ECA:  46 cm/sec RIGHT CAROTID ARTERY: The common carotid artery demonstrates mild intimal thickening. No focal plaque is identified. Velocities and waveforms are normal and there is no evidence of carotid stenosis. RIGHT VERTEBRAL ARTERY: Antegrade flow with normal waveform and velocity. LEFT CAROTID ARTERY: Minimal intimal thickening at the level of the carotid bulb. No focal plaque identified. Velocities and waveforms are unremarkable. There is no evidence of carotid stenosis. LEFT VERTEBRAL ARTERY: Antegrade flow with normal waveform and velocity. IMPRESSION: Unremarkable carotid duplex ultrasound demonstrating no evidence of focal plaque or carotid stenosis bilaterally. Electronically Signed   By: Irish LackGlenn  Yamagata M.D.   On: 03/01/2016 17:26   ASSESSMENT AND PLAN:  Evonnie Patwila Whetsel  is a 75 y.o. female with no ST medical problems comes in because of syncope. Patient passed out at home while she was brushing the cat, she called her daughter and she called ambulance. In the emergency room she passed out again. No chest pain or dizziness. Patient has fatigue, nausea. Patient had episodes of syncope and  incontinence at home  1. Syncope x2 with urinary incontinence -w/u neg sofar -SR on Tele, MRI brain neg, Carotid doppler neg, ECHO OK -EEG pending -seen by neurology -PT to see  2. Gerd  3.Glaucoma cont eyedrops    Case discussed with Care Management/Social Worker. Management plans discussed with the patient, family and they are in agreement.  CODE STATUS: full  DVT Prophylaxis: lovenox  TOTAL TIME TAKING CARE OF THIS PATIENT: 30 minutes.  >50% time spent on counselling and coordination of care  POSSIBLE D/C IN 1-2 DAYS, DEPENDING ON CLINICAL CONDITION.  Note: This dictation was prepared with Dragon dictation along with smaller phrase technology. Any transcriptional errors that  result from this process are unintentional.  Aly Seidenberg M.D on 03/01/2016 at 5:40 PM  Between 7am to 6pm - Pager - [phone]  After 6pm go to [email or web address] - password [email or web address] [facility]  [facility]  Office  [phone]  CC: Primary care physician; [facility]

## 2016-03-01 NOTE — Progress Notes (Signed)
*  PRELIMINARY RESULTS* Echocardiogram 2D Echocardiogram has been performed.  Cheryl Cummings, Cheryl Cummings 03/01/2016, 2:12 PM

## 2016-03-01 NOTE — Care Management Obs Status (Signed)
MEDICARE OBSERVATION STATUS NOTIFICATION   Patient Details  Name: Cheryl Cummings J Edell MRN: [id number] Date of Birth: [birth year]   Medicare Observation Status Notification Given:  Yes    Marily MemosLisa M Ayame Rena, RN 03/01/2016, 9:45 AM

## 2016-03-01 NOTE — Consult Note (Signed)
Reason for Consult:Syncope Referring Physician: Allena Katz  CC: Syncope  HPI: Cheryl Cummings is an 75 y.o. female who was at home on yesterday sitting on the floor brushing the cat when she passed out.  She got no warning but found herself waking up from the floor with her glasses off her face and having urinated on herself. She called her daughter and she called ambulance. In the emergency room she passed out again. No chest pain or dizziness. Patient has fatigue, nausea. No recent illnesses. No appetite problems. But the daughter mentioned that she is losing a lot of weight recently. Patient lives alone.  Past medical history: GERD, glaucoma  History reviewed. No pertinent surgical history.  Family history: Parents deceased.  Mother with dementia, father with thyroid disease.  Son with asthma.   Social History:  reports that she has never smoked. She has never used smokeless tobacco. She reports that she does not drink alcohol or use drugs.  Allergies  Allergen Reactions  . Ibuprofen Swelling  . Mushroom Extract Complex Nausea And Vomiting  . Valium [Diazepam] Other (See Comments)    Unknown   . Shellfish Allergy Rash    Medications:  I have reviewed the patient's current medications. Prior to Admission:  Prescriptions Prior to Admission  Medication Sig Dispense Refill Last Dose  . dorzolamide-timolol (COSOPT) 22.3-6.8 MG/ML ophthalmic solution Place 1 drop into both eyes 2 (two) times daily.   02/29/2016 at Unknown time  . TRAVATAN Z 0.004 % SOLN ophthalmic solution Place 1 drop into both eyes at bedtime.   02/28/2016 at Unknown time    ROS: History obtained from the patient  General ROS: negative for - chills, fatigue, fever, night sweats, weight gain or weight loss Psychological ROS: negative for - behavioral disorder, hallucinations, memory difficulties, mood swings or suicidal ideation Ophthalmic ROS: negative for - blurry vision, double vision, eye pain or loss of  vision ENT ROS: negative for - epistaxis, nasal discharge, oral lesions, sore throat, tinnitus or vertigo Allergy and Immunology ROS: negative for - hives or itchy/watery eyes Hematological and Lymphatic ROS: negative for - bleeding problems, bruising or swollen lymph nodes Endocrine ROS: negative for - galactorrhea, hair pattern changes, polydipsia/polyuria or temperature intolerance Respiratory ROS: negative for - cough, hemoptysis, shortness of breath or wheezing Cardiovascular ROS: palpitations Gastrointestinal ROS: negative for - abdominal pain, diarrhea, hematemesis, nausea/vomiting or stool incontinence Genito-Urinary ROS: negative for - dysuria, hematuria, incontinence or urinary frequency/urgency Musculoskeletal ROS: negative for - joint swelling or muscular weakness Neurological ROS: as noted in HPI, tremor Dermatological ROS: negative for rash and skin lesion changes  Physical Examination: Blood pressure 131/65, pulse 82, temperature 98.5 F (36.9 C), temperature source Oral, resp. rate 18, height 5\' 4"  (1.626 m), weight 46.7 kg (102 lb 14.4 oz), SpO2 98 %.  HEENT-  Normocephalic, no lesions, without obvious abnormality.  Normal external eye and conjunctiva.  Normal TM's bilaterally.  Normal auditory canals and external ears. Normal external nose, mucus membranes and septum.  Normal pharynx. Cardiovascular- S1, S2 normal, pulses palpable throughout   Lungs- chest clear, no wheezing, rales, normal symmetric air entry Abdomen- soft, non-tender; bowel sounds normal; no masses,  no organomegaly Extremities- no edema Lymph-no adenopathy palpable Musculoskeletal-no joint tenderness, deformity or swelling Skin-warm and dry, no hyperpigmentation, vitiligo, or suspicious lesions  Neurological Examination Mental Status: Alert, oriented, thought content appropriate.  Speech fluent without evidence of aphasia.  Able to follow 3 step commands without difficulty. Cranial Nerves: II: Discs  flat  bilaterally; Visual fields grossly normal, pupils equal, round, reactive to light and accommodation III,IV, VI: mild left ptosis, extra-ocular motions intact bilaterally V,VII: smile symmetric, facial light touch sensation normal bilaterally VIII: hearing normal bilaterally IX,X: gag reflex present XI: bilateral shoulder shrug XII: midline tongue extension Motor: Right : Upper extremity   5/5    Left:     Upper extremity   5/5  Lower extremity   5/5     Lower extremity   5/5 Tone and bulk:normal tone throughout; no atrophy noted.  Intermittent RUE tremor.  Bradykinetic Sensory: Pinprick and light touch intact throughout, bilaterally Deep Tendon Reflexes: 2+ and symmetric throughout Plantars: Right: downgoing   Left: downgoing Cerebellar: Normal finger-to-nose and normal heel-to-shin testing bilaterally Gait: not tested due to safety concerns    Laboratory Studies:   Basic Metabolic Panel:  Recent Labs Lab 02/29/16 1526 03/01/16 0503  NA 141 141  K 4.1 3.5  CL 105 106  CO2 27 29  GLUCOSE 124* 93  BUN 19 17  CREATININE 0.77 0.64  CALCIUM 10.4* 9.1    Liver Function Tests: No results for input(s): AST, ALT, ALKPHOS, BILITOT, PROT, ALBUMIN in the last 168 hours. No results for input(s): LIPASE, AMYLASE in the last 168 hours. No results for input(s): AMMONIA in the last 168 hours.  CBC:  Recent Labs Lab 02/29/16 1526 03/01/16 0503  WBC 9.6 6.5  NEUTROABS 7.4*  --   HGB 15.1 13.5  HCT 43.3 38.3  MCV 93.1 93.1  PLT 211 173    Cardiac Enzymes:  Recent Labs Lab 02/29/16 2227 03/01/16 0503 03/01/16 0951  TROPONINI <0.03 <0.03 <0.03    BNP: Invalid input(s): POCBNP  CBG:  Recent Labs Lab 02/29/16 2206  GLUCAP 168*    Microbiology: Results for orders placed or performed in visit on 03/12/14  Urine culture     Status: None   Collection Time: 03/12/14 11:15 AM  Result Value Ref Range Status   Micro Text Report   Final       SOURCE: CLEAN  CATCH    COMMENT                   MIXED BACTERIAL ORGANISMS   COMMENT                   RESULTS SUGGESTIVE OF CONTAMINATION   ANTIBIOTIC                                                        Coagulation Studies: No results for input(s): LABPROT, INR in the last 72 hours.  Urinalysis:  Recent Labs Lab 02/29/16 1526  COLORURINE YELLOW*  LABSPEC 1.008  PHURINE 9.0*  GLUCOSEU NEGATIVE  HGBUR NEGATIVE  BILIRUBINUR NEGATIVE  KETONESUR NEGATIVE  PROTEINUR NEGATIVE  NITRITE NEGATIVE  LEUKOCYTESUR NEGATIVE    Lipid Panel:  No results found for: CHOL, TRIG, HDL, CHOLHDL, VLDL, LDLCALC  HgbA1C: No results found for: HGBA1C  Urine Drug Screen:  No results found for: LABOPIA, COCAINSCRNUR, LABBENZ, AMPHETMU, THCU, LABBARB  Alcohol Level:  Recent Labs Lab 02/29/16 1526  ETH <5    Other results: EKG:  sinus rhythm at 66 bpm.  Imaging: Ct Head Wo Contrast  Result Date: 02/29/2016 CLINICAL DATA:  Syncopal episode. EXAM: CT HEAD WITHOUT CONTRAST TECHNIQUE: Contiguous axial images were  obtained from the base of the skull through the vertex without intravenous contrast. COMPARISON:  None. FINDINGS: Brain: Moderate diffuse periventricular and subcortical white matter hypoattenuation is present bilaterally. Basal ganglia are intact. Insular ribbon is normal. No focal cortical lesions are present. No acute infarct, hemorrhage, or mass lesion is present. The ventricles are of normal size. No significant extraaxial fluid collection is present. Vascular: Atherosclerotic calcifications are noted within the precavernous and cavernous internal carotid arteries bilaterally. No hyperdense Skull: Calvarium is within normal limits. Skullbase is unremarkable. Sinuses/Orbits: The paranasal sinuses are clear. The mastoid air cells are clear. The globes and orbits are intact. Other: No significant extracranial soft tissue lesions are present. IMPRESSION: 1. Moderate diffuse periventricular and  subcortical white matter hypoattenuation bilaterally likely reflects the sequela of chronic microvascular ischemia. 2. No acute intracranial abnormality. Electronically Signed   By: Marin Robertshristopher  Mattern M.D.   On: 02/29/2016 16:44     Assessment/Plan: 75 year old female with syncope of unknown etiology.  No previous history.  Neurological examination is nonfocal.  Head CT shows no acute changes.  Syncope and seizure remain on the differential as well as cardiac issues.  Further work up recommended.  Recommendations: 1.  MRI of the brain with and without contrast 2.  EEG 3.  Echocardiogram 4.  Telemetry monitoring 5.  TSH  Thana FarrLeslie Patsye Sullivant, MD Neurology [phone] 03/01/2016, 11:25 AM

## 2016-03-02 DIAGNOSIS — R55 Syncope and collapse: Secondary | ICD-10-CM | POA: Diagnosis not present

## 2016-03-02 DIAGNOSIS — I951 Orthostatic hypotension: Secondary | ICD-10-CM | POA: Diagnosis not present

## 2016-03-02 LAB — GLUCOSE, CAPILLARY: Glucose-Capillary: 95 mg/dL (ref 65–99)

## 2016-03-02 LAB — TSH: TSH: 1.103 u[IU]/mL (ref 0.350–4.500)

## 2016-03-02 NOTE — Progress Notes (Signed)
Physical Therapy Treatment Patient Details Name: Gaylene Brookswila J Kamaka MRN: [id number] DOB: [birth year] Today's Date: 03/02/2016    History of Present Illness Pt is a 75 yr old female who was sitting on the floor brushing her cat when she passed out; remembers nothing just woke up laying on her glassess and having urinated on herself. She passed out again once in the ED. CT head and MRI brain with no acute abnormality. PMH unremarkable    PT Comments    Pt making progress towards mobility goals, remains limited by significant fear of falling and generalized weakness/deconditioning. Pt able to ambulate 6535ft with RW, min A, and close chair follow, though gait pattern is with significant deviation. Pt ambulates with extremely narrow BOS (feet touching one another), decreased step length/height bilaterally, and requires min A from PT to progress RW. Pt monitored for orthostatics and BP responded as follows: supine 148/90, sitting 151/79, standing 139/53, and following ambulation 148/84.  Pt asymptomatic throughout session.  Pt remains appropriate candidate for STR in order to return to PLOF, but per conversation with CM, pt and pt's family prefer for pt to go home. Should pt d/c home, she will require RW, HHPT, and 24/7 assist.   Follow Up Recommendations  SNF     Equipment Recommendations  Rolling walker with 5" wheels    Recommendations for Other Services       Precautions / Restrictions Precautions Precautions: Fall Restrictions Weight Bearing Restrictions: No    Mobility  Bed Mobility Overal bed mobility: Needs Assistance Bed Mobility: Supine to Sit Supine to sit: Supervision General bed mobility comments: Pt able to achieve sitting EOB with heavily reliance on bed rails. Increased time required.  Transfers Overall transfer level: Needs assistance Equipment used: Rolling walker (2 wheeled) Transfers: Sit to/from Stand Sit to Stand: Min guard General transfer comment: Pt able to  achieve standing at RW with min guard for safety. Very slow and deliberate with transfer. Vc's for hand/foot placement, DME use, and sequencing.  Ambulation/Gait Ambulation/Gait assistance: Min assist Ambulation Distance (Feet): 35 Feet Assistive device: Rolling walker (2 wheeled) Gait Pattern/deviations: Step-to pattern;Decreased stride length;Narrow base of support Gait velocity: Decreased General Gait Details: Pt walks with signfiicantly decreased step length bilaterally and incredibly narrow BOS (feet touching). No correction with max vc's, pt just takes quicker steps when asked to take longer steps. Pt requires min A for propulsion of RW, is unable to independently manage the forward progression. Pt endorses this is not her baseline for ambulation, but is very limited by fear and deconditioning from hospital stay.   Stairs    Wheelchair Mobility    Modified Rankin (Stroke Patients Only)      Balance Overall balance assessment: Needs assistance Sitting-balance support: Bilateral upper extremity supported;Feet supported Sitting balance-Leahy Scale: Good   Postural control: Posterior lean Standing balance support: Bilateral upper extremity supported (on RW) Standing balance-Leahy Scale: Fair Standing balance comment: Pt initially stands with poor balance d/t posterior lean, but with verbal cues and min A pt able to perform anterior weight shift and demonstrates fair standing balance at RW.    Cognition Arousal/Alertness: Awake/alert Behavior During Therapy: WFL for tasks assessed/performed Overall Cognitive Status: Within Functional Limits for tasks assessed    Exercises General Exercises - Lower Extremity Ankle Circles/Pumps: AROM;Both Short Arc Quad: AROM;Both Heel Slides: AROM;Both Hip ABduction/ADduction: AROM;Both Straight Leg Raises: AROM;Both All exercises performed bilaterally x 10 reps in supine.    General Comments Pt resting supine upon room entry, pleasant  and agreeable to therapy.      Pertinent Vitals/Pain Pain Assessment: No/denies pain  HR and O2 monitored throughout session and maintained WFL.            PT Goals (current goals can now be found in the care plan section) Acute Rehab PT Goals Patient Stated Goal: To get feeling better PT Goal Formulation: With patient Time For Goal Achievement: 03/15/16 Potential to Achieve Goals: Good Progress towards PT goals: Progressing toward goals    Frequency  Min 2X/week    PT Plan Current plan remains appropriate       End of Session Equipment Utilized During Treatment: Gait belt Activity Tolerance: Patient tolerated treatment well Patient left: in chair;with call bell/phone within reach;with chair alarm set     Time: [id number] PT Time Calculation (min) (ACUTE ONLY): 38 min  Charges:                       G Codes:      Adhira Jamil, SPT 03/02/2016, 1:38 PM

## 2016-03-02 NOTE — Progress Notes (Signed)
Pt discharged to home via wc.  Instructions and given to pt and daughter.  Questions answered.  No distress.

## 2016-03-02 NOTE — Care Management (Signed)
Daughter Cheryl Cummings's address is [address], [place] [id number].   Cheryl Cummings says that patient "always walks slow and takes short steps. Patient has been gradually losing weight over the last several years most likely due to her diet- chose to eat vegetables and fruit and probably did not eat enough protein.  Cheryl Cummings goes on to say that patient's house is very cluttered "patient needs to make some changes.'  Added a social worker to the home health referral in the event there is a need for community resources.  Again offer skilled nursing and discussed could obtain a bed today if wished but would be private pay. Declined.  Updated Cheryl with [facility] and patient will be seen 8/19.

## 2016-03-02 NOTE — Progress Notes (Signed)
[facility] - [place] [facility]   PATIENT NAME: Cheryl Cummings    MR#:  [id number]  DATE OF BIRTH:  01-24-1941  DATE OF ADMISSION:  02/29/2016 ADMITTING PHYSICIAN: Katha HammingSnehalatha Konidena, MD  DATE OF DISCHARGE: 03/01/16  PRIMARY CARE PHYSICIAN: [facility]    ADMISSION DIAGNOSIS:  Syncope [R55] Orthostatic dizziness [R42] Syncope, unspecified syncope type [R55]  DISCHARGE DIAGNOSIS:  Syncope due to Orthostatic hypotension  SECONDARY DIAGNOSIS:  History reviewed. No pertinent past medical history.  HOSPITAL COURSE:   TwilaBuffingtonis a 75 y.o.femalewith no ST medical problems comes in because of syncope. Patient passed out at home while she was brushing the cat. In the emergency room she passed out again. No chest pain or dizziness. Patient has fatigue, nausea.   1. Syncope x2 with urinary incontinence due to orthostatic hypotension -Orthstatic vital better than y'day -encourage pt to drink fluids espescially that she likes to work out doors. Son reports pt drinks  -w/u neg sofar with negative MRI,EEG and US carotid. ECHO normal -SR on Tele -seen by neurology -PT recomemends Rehab. Will have her eval again today. If pt's insurance does not approve than will get her home with HHPT (with daughter)  2. Gerd  3.Glaucoma cont eyedrops  D/w son at length in the room. CM for d/c planning CONSULTS OBTAINED:  Treatment Team:  Thana FarrLeslie Reynolds, MD  DRUG ALLERGIES:   Allergies  Allergen Reactions  . Ibuprofen Swelling  . Mushroom Extract Complex Nausea And Vomiting  . Valium [Diazepam] Other (See Comments)    Unknown   . Shellfish Allergy Rash    DISCHARGE MEDICATIONS:   Current Discharge Medication List    CONTINUE these medications which have NOT CHANGED   Details  dorzolamide-timolol (COSOPT) 22.3-6.8 MG/ML ophthalmic solution Place 1 drop into both eyes 2 (two) times daily.    TRAVATAN Z 0.004 % SOLN ophthalmic  solution Place 1 drop into both eyes at bedtime.        If you experience worsening of your admission symptoms, develop shortness of breath, life threatening emergency, suicidal or homicidal thoughts you must seek medical attention immediately by calling 911 or calling your MD immediately  if symptoms less severe.  You Must read complete instructions/literature along with all the possible adverse reactions/side effects for all the Medicines you take and that have been prescribed to you. Take any new Medicines after you have completely understood and accept all the possible adverse reactions/side effects.   Please note  You were cared for by a hospitalist during your hospital stay. If you have any questions about your discharge medications or the care you received while you were in the hospital after you are discharged, you can call the unit and asked to speak with the hospitalist on call if the hospitalist that took care of you is not available. Once you are discharged, your primary care physician will handle any further medical issues. Please note that NO REFILLS for any discharge medications will be authorized once you are discharged, as it is imperative that you return to your primary care physician (or establish a relationship with a primary care physician if you do not have one) for your aftercare needs so that they can reassess your need for medications and monitor your lab values. Today   SUBJECTIVE   Feels some better. Not dizzy today. Slept good  VITAL SIGNS:  Blood pressure (!) 143/84, pulse 70, temperature 98.4 F (36.9 C), temperature source Oral, resp. rate 16, height  5\' 4"  (1.626 m), weight 104 lb 3.2 oz (47.3 kg), SpO2 99 %.  I/O:   Intake/Output Summary (Last 24 hours) at 03/02/16 0950 Last data filed at 03/02/16 0730  Gross per 24 hour  Intake                0 ml  Output              500 ml  Net             -500 ml    PHYSICAL EXAMINATION:  GENERAL:  75 y.o.-year-old  patient lying in the bed with no acute distress.  EYES: Pupils equal, round, reactive to light and accommodation. No scleral icterus. Extraocular muscles intact.  HEENT: Head atraumatic, normocephalic. Oropharynx and nasopharynx clear.  NECK:  Supple, no jugular venous distention. No thyroid enlargement, no tenderness.  LUNGS: Normal breath sounds bilaterally, no wheezing, rales,rhonchi or crepitation. No use of accessory muscles of respiration.  CARDIOVASCULAR: S1, S2 normal. No murmurs, rubs, or gallops.  ABDOMEN: Soft, non-tender, non-distended. Bowel sounds present. No organomegaly or mass.  EXTREMITIES: No pedal edema, cyanosis, or clubbing.  NEUROLOGIC: Cranial nerves II through XII are intact. Muscle strength 5/5 in all extremities. Sensation intact. Gait not checked.  PSYCHIATRIC: The patient is alert and oriented x 3.  SKIN: No obvious rash, lesion, or ulcer.   DATA REVIEW:   CBC   Recent Labs Lab 03/01/16 0503  WBC 6.5  HGB 13.5  HCT 38.3  PLT 173    Chemistries   Recent Labs Lab 03/01/16 0503  NA 141  K 3.5  CL 106  CO2 29  GLUCOSE 93  BUN 17  CREATININE 0.64  CALCIUM 9.1    Microbiology Results   No results found for this or any previous visit (from the past 240 hour(s)).  RADIOLOGY:  Ct Head Wo Contrast  Result Date: 02/29/2016 CLINICAL DATA:  Syncopal episode. EXAM: CT HEAD WITHOUT CONTRAST TECHNIQUE: Contiguous axial images were obtained from the base of the skull through the vertex without intravenous contrast. COMPARISON:  None. FINDINGS: Brain: Moderate diffuse periventricular and subcortical white matter hypoattenuation is present bilaterally. Basal ganglia are intact. Insular ribbon is normal. No focal cortical lesions are present. No acute infarct, hemorrhage, or mass lesion is present. The ventricles are of normal size. No significant extraaxial fluid collection is present. Vascular: Atherosclerotic calcifications are noted within the precavernous  and cavernous internal carotid arteries bilaterally. No hyperdense Skull: Calvarium is within normal limits. Skullbase is unremarkable. Sinuses/Orbits: The paranasal sinuses are clear. The mastoid air cells are clear. The globes and orbits are intact. Other: No significant extracranial soft tissue lesions are present. IMPRESSION: 1. Moderate diffuse periventricular and subcortical white matter hypoattenuation bilaterally likely reflects the sequela of chronic microvascular ischemia. 2. No acute intracranial abnormality. Electronically Signed   By: Marin Roberts M.D.   On: 02/29/2016 16:44   Mr Laqueta Jean ZO Contrast  Result Date: 03/01/2016 CLINICAL DATA:  Syncopal episode. EXAM: MRI HEAD WITHOUT AND WITH CONTRAST TECHNIQUE: Multiplanar, multiecho pulse sequences of the brain and surrounding structures were obtained without and with intravenous contrast. CONTRAST:  9mL MULTIHANCE GADOBENATE DIMEGLUMINE 529 MG/ML IV SOLN COMPARISON:  Head CT 02/29/2016 FINDINGS: Diffusion imaging does not show any acute or subacute infarction. There chronic small-vessel ischemic changes affecting the pons. No focal cerebellar insult. Cerebral hemispheres show moderate to marked chronic small-vessel ischemic changes affecting the deep and subcortical white matter. No cortical or large vessel territory infarction.  No mass lesion, hemorrhage, hydrocephalus or extra-axial collection. No pituitary mass. No inflammatory sinus disease. No skull or skullbase lesion. IMPRESSION: No acute finding. Extensive chronic small-vessel ischemic changes affecting the cerebral hemispheric white matter. Electronically Signed   By: Paulina FusiMark  Shogry M.D.   On: 03/01/2016 15:38   [place] Carotid Bilateral  Result Date: 03/01/2016 CLINICAL DATA:  Syncope. EXAM: BILATERAL CAROTID DUPLEX ULTRASOUND TECHNIQUE: Wallace CullensGray scale imaging, color Doppler and duplex ultrasound were performed of bilateral carotid and vertebral arteries in the neck. COMPARISON:  None.  FINDINGS: Criteria: Quantification of carotid stenosis is based on velocity parameters that correlate the residual internal carotid diameter with NASCET-based stenosis levels, using the diameter of the distal internal carotid lumen as the denominator for stenosis measurement. The following velocity measurements were obtained: RIGHT ICA:  33/9 cm/sec CCA:  84/14 cm/sec SYSTOLIC ICA/CCA RATIO:  0.4 DIASTOLIC ICA/CCA RATIO:  0.6 ECA:  62 cm/sec LEFT ICA:  33/10 cm/sec CCA:  62/13 cm/sec SYSTOLIC ICA/CCA RATIO:  0.5 DIASTOLIC ICA/CCA RATIO:  0.8 ECA:  46 cm/sec RIGHT CAROTID ARTERY: The common carotid artery demonstrates mild intimal thickening. No focal plaque is identified. Velocities and waveforms are normal and there is no evidence of carotid stenosis. RIGHT VERTEBRAL ARTERY: Antegrade flow with normal waveform and velocity. LEFT CAROTID ARTERY: Minimal intimal thickening at the level of the carotid bulb. No focal plaque identified. Velocities and waveforms are unremarkable. There is no evidence of carotid stenosis. LEFT VERTEBRAL ARTERY: Antegrade flow with normal waveform and velocity. IMPRESSION: Unremarkable carotid duplex ultrasound demonstrating no evidence of focal plaque or carotid stenosis bilaterally. Electronically Signed   By: Irish LackGlenn  Yamagata M.D.   On: 03/01/2016 17:26     Management plans discussed with the patient, family and they are in agreement.  CODE STATUS:     Code Status Orders        Start     Ordered   02/29/16 2014  Full code  Continuous     02/29/16 2015    Code Status History    Date Active Date Inactive Code Status Order ID Comments User Context   02/29/2016  8:15 PM 03/01/2016  8:00 AM Full Code [id number]  Katha HammingSnehalatha Konidena, MD ED      TOTAL TIME TAKING CARE OF THIS PATIENT: 40 minutes.    Paulett Kaufhold M.D on 03/02/2016 at 9:50 AM  Between 7am to 6pm - Pager - [phone] After 6pm go to [email or web address] - password [email or web address]  [facility]  Office   [phone]  CC: Primary care physician; [facility]

## 2016-03-02 NOTE — Care Management (Signed)
Patient placed in observation after having syncope episode at home.  During the stay she has had some orthostatic hypotension.  Physical therapy evaluated 8/17 and recommended skilled nursing placement.  Explained to patient and son that medicare would not pay for a skilled nursing stay without a qualifying stay.  Family has planned for patient to go to [place]: Tamara Johnson: C [phone]  Home [phone].  Will obtain the address when she comes to take patient home.  Agency preference for home is is [facility].  Agency can see patient in [place] then in her own residence when she returns to her own home.  Agency will see patient 8/19.  Services ordered is SN and PT.  Anticipate need for a walker.  Discussed with primary nurse of the need for physical therapy to re evaluate as early as possible today.  [facility]

## 2016-03-04 DIAGNOSIS — Z9181 History of falling: Secondary | ICD-10-CM | POA: Diagnosis not present

## 2016-03-04 DIAGNOSIS — K219 Gastro-esophageal reflux disease without esophagitis: Secondary | ICD-10-CM | POA: Diagnosis not present

## 2016-03-04 DIAGNOSIS — R55 Syncope and collapse: Secondary | ICD-10-CM | POA: Diagnosis not present

## 2016-03-04 DIAGNOSIS — H409 Unspecified glaucoma: Secondary | ICD-10-CM | POA: Diagnosis not present

## 2016-03-04 DIAGNOSIS — R531 Weakness: Secondary | ICD-10-CM | POA: Diagnosis not present

## 2016-03-04 DIAGNOSIS — R42 Dizziness and giddiness: Secondary | ICD-10-CM | POA: Diagnosis not present

## 2016-03-08 DIAGNOSIS — Z9181 History of falling: Secondary | ICD-10-CM | POA: Diagnosis not present

## 2016-03-08 DIAGNOSIS — K219 Gastro-esophageal reflux disease without esophagitis: Secondary | ICD-10-CM | POA: Diagnosis not present

## 2016-03-08 DIAGNOSIS — R55 Syncope and collapse: Secondary | ICD-10-CM | POA: Diagnosis not present

## 2016-03-08 DIAGNOSIS — R42 Dizziness and giddiness: Secondary | ICD-10-CM | POA: Diagnosis not present

## 2016-03-08 DIAGNOSIS — R531 Weakness: Secondary | ICD-10-CM | POA: Diagnosis not present

## 2016-03-08 DIAGNOSIS — H409 Unspecified glaucoma: Secondary | ICD-10-CM | POA: Diagnosis not present

## 2016-03-08 NOTE — Discharge Summary (Signed)
[facility] [place] [facility]   PATIENT NAME: Cheryl Cummings    MR#:  [id number]  DATE OF BIRTH:  25-May-1941  DATE OF ADMISSION:  02/29/2016              ADMITTING PHYSICIAN: Katha HammingSnehalatha Konidena, MD  DATE OF DISCHARGE: 03/01/16  PRIMARY CARE PHYSICIAN: [facility]    ADMISSION DIAGNOSIS:  Syncope [R55] Orthostatic dizziness [R42] Syncope, unspecified syncope type [R55]  DISCHARGE DIAGNOSIS:  Syncope due to Orthostatic hypotension  SECONDARY DIAGNOSIS:  History reviewed. No pertinent past medical history.  HOSPITAL COURSE:   TwilaBuffingtonis a 75 y.o.femalewith no ST medical problems comes in because of syncope. Patient passed out at home while she was brushing the cat. In the emergency room she passed out again. No chest pain or dizziness. Patient has fatigue, nausea.   1. Syncope x2 with urinary incontinence due to orthostatic hypotension -Orthstatic vital better than y'day -encourage pt to drink fluids espescially that she likes to work out doors. Son reports pt drinks  -w/u neg sofar with negative MRI,EEG and US carotid. ECHO normal -SR on Tele -seen by neurology -PT recomemends Rehab. Will have her eval again today. If pt's insurance does not approve than will get her home with HHPT (with daughter)  2. Gerd  3.Glaucoma cont eyedrops  D/w son at length in the room. CM for d/c planning CONSULTS OBTAINED:  Treatment Team:  Thana FarrLeslie Reynolds, MD  DRUG ALLERGIES:        Allergies  Allergen Reactions  . Ibuprofen Swelling  . Mushroom Extract Complex Nausea And Vomiting  . Valium [Diazepam] Other (See Comments)    Unknown  . Shellfish Allergy Rash    DISCHARGE MEDICATIONS:      Current Discharge Medication List       CONTINUE these medications which have NOT CHANGED   Details  dorzolamide-timolol (COSOPT) 22.3-6.8 MG/ML ophthalmic solution Place 1 drop into both eyes 2 (two)  times daily.    TRAVATAN Z 0.004 % SOLN ophthalmic solution Place 1 drop into both eyes at bedtime.

## 2016-03-13 DIAGNOSIS — R55 Syncope and collapse: Secondary | ICD-10-CM | POA: Diagnosis not present

## 2016-03-13 DIAGNOSIS — R531 Weakness: Secondary | ICD-10-CM | POA: Diagnosis not present

## 2016-03-13 DIAGNOSIS — K219 Gastro-esophageal reflux disease without esophagitis: Secondary | ICD-10-CM | POA: Diagnosis not present

## 2016-03-13 DIAGNOSIS — Z9181 History of falling: Secondary | ICD-10-CM | POA: Diagnosis not present

## 2016-03-13 DIAGNOSIS — R42 Dizziness and giddiness: Secondary | ICD-10-CM | POA: Diagnosis not present

## 2016-03-13 DIAGNOSIS — H409 Unspecified glaucoma: Secondary | ICD-10-CM | POA: Diagnosis not present

## 2016-03-15 DIAGNOSIS — R55 Syncope and collapse: Secondary | ICD-10-CM | POA: Diagnosis not present

## 2016-03-15 DIAGNOSIS — Z9181 History of falling: Secondary | ICD-10-CM | POA: Diagnosis not present

## 2016-03-15 DIAGNOSIS — R42 Dizziness and giddiness: Secondary | ICD-10-CM | POA: Diagnosis not present

## 2016-03-15 DIAGNOSIS — K219 Gastro-esophageal reflux disease without esophagitis: Secondary | ICD-10-CM | POA: Diagnosis not present

## 2016-03-15 DIAGNOSIS — H409 Unspecified glaucoma: Secondary | ICD-10-CM | POA: Diagnosis not present

## 2016-03-15 DIAGNOSIS — R531 Weakness: Secondary | ICD-10-CM | POA: Diagnosis not present

## 2016-03-20 DIAGNOSIS — R42 Dizziness and giddiness: Secondary | ICD-10-CM | POA: Diagnosis not present

## 2016-03-20 DIAGNOSIS — K219 Gastro-esophageal reflux disease without esophagitis: Secondary | ICD-10-CM | POA: Diagnosis not present

## 2016-03-20 DIAGNOSIS — R531 Weakness: Secondary | ICD-10-CM | POA: Diagnosis not present

## 2016-03-20 DIAGNOSIS — R55 Syncope and collapse: Secondary | ICD-10-CM | POA: Diagnosis not present

## 2016-03-20 DIAGNOSIS — H409 Unspecified glaucoma: Secondary | ICD-10-CM | POA: Diagnosis not present

## 2016-03-20 DIAGNOSIS — Z9181 History of falling: Secondary | ICD-10-CM | POA: Diagnosis not present

## 2016-03-22 DIAGNOSIS — H409 Unspecified glaucoma: Secondary | ICD-10-CM | POA: Diagnosis not present

## 2016-03-22 DIAGNOSIS — R55 Syncope and collapse: Secondary | ICD-10-CM | POA: Diagnosis not present

## 2016-03-22 DIAGNOSIS — R531 Weakness: Secondary | ICD-10-CM | POA: Diagnosis not present

## 2016-03-22 DIAGNOSIS — R42 Dizziness and giddiness: Secondary | ICD-10-CM | POA: Diagnosis not present

## 2016-03-22 DIAGNOSIS — Z9181 History of falling: Secondary | ICD-10-CM | POA: Diagnosis not present

## 2016-03-22 DIAGNOSIS — K219 Gastro-esophageal reflux disease without esophagitis: Secondary | ICD-10-CM | POA: Diagnosis not present

## 2016-03-27 DIAGNOSIS — R55 Syncope and collapse: Secondary | ICD-10-CM | POA: Diagnosis not present

## 2016-03-27 DIAGNOSIS — Z9181 History of falling: Secondary | ICD-10-CM | POA: Diagnosis not present

## 2016-03-27 DIAGNOSIS — H409 Unspecified glaucoma: Secondary | ICD-10-CM | POA: Diagnosis not present

## 2016-03-27 DIAGNOSIS — R42 Dizziness and giddiness: Secondary | ICD-10-CM | POA: Diagnosis not present

## 2016-03-27 DIAGNOSIS — K219 Gastro-esophageal reflux disease without esophagitis: Secondary | ICD-10-CM | POA: Diagnosis not present

## 2016-03-27 DIAGNOSIS — R531 Weakness: Secondary | ICD-10-CM | POA: Diagnosis not present

## 2016-04-24 DIAGNOSIS — H04123 Dry eye syndrome of bilateral lacrimal glands: Secondary | ICD-10-CM | POA: Diagnosis not present

## 2016-04-24 DIAGNOSIS — H401121 Primary open-angle glaucoma, left eye, mild stage: Secondary | ICD-10-CM | POA: Diagnosis not present

## 2016-04-24 DIAGNOSIS — H401112 Primary open-angle glaucoma, right eye, moderate stage: Secondary | ICD-10-CM | POA: Diagnosis not present

## 2016-06-29 DIAGNOSIS — M25512 Pain in left shoulder: Secondary | ICD-10-CM | POA: Diagnosis not present

## 2016-06-29 DIAGNOSIS — E860 Dehydration: Secondary | ICD-10-CM | POA: Diagnosis not present

## 2016-06-29 DIAGNOSIS — G8929 Other chronic pain: Secondary | ICD-10-CM | POA: Diagnosis not present

## 2016-06-29 DIAGNOSIS — G20 Parkinson's disease: Secondary | ICD-10-CM | POA: Diagnosis not present

## 2016-06-29 DIAGNOSIS — S46002S Unspecified injury of muscle(s) and tendon(s) of the rotator cuff of left shoulder, sequela: Secondary | ICD-10-CM | POA: Diagnosis not present

## 2016-06-29 DIAGNOSIS — M19012 Primary osteoarthritis, left shoulder: Secondary | ICD-10-CM | POA: Diagnosis not present

## 2016-07-18 DIAGNOSIS — R251 Tremor, unspecified: Secondary | ICD-10-CM | POA: Diagnosis not present

## 2016-07-18 DIAGNOSIS — G20 Parkinson's disease: Secondary | ICD-10-CM | POA: Diagnosis not present

## 2016-07-18 DIAGNOSIS — M25512 Pain in left shoulder: Secondary | ICD-10-CM | POA: Diagnosis not present

## 2016-07-23 DIAGNOSIS — G20 Parkinson's disease: Secondary | ICD-10-CM | POA: Diagnosis not present

## 2016-07-23 DIAGNOSIS — M25512 Pain in left shoulder: Secondary | ICD-10-CM | POA: Diagnosis not present

## 2016-07-23 DIAGNOSIS — R251 Tremor, unspecified: Secondary | ICD-10-CM | POA: Diagnosis not present

## 2016-07-26 DIAGNOSIS — M25512 Pain in left shoulder: Secondary | ICD-10-CM | POA: Diagnosis not present

## 2016-07-26 DIAGNOSIS — G20 Parkinson's disease: Secondary | ICD-10-CM | POA: Diagnosis not present

## 2016-07-26 DIAGNOSIS — R251 Tremor, unspecified: Secondary | ICD-10-CM | POA: Diagnosis not present

## 2016-07-31 DIAGNOSIS — M25512 Pain in left shoulder: Secondary | ICD-10-CM | POA: Diagnosis not present

## 2016-07-31 DIAGNOSIS — R251 Tremor, unspecified: Secondary | ICD-10-CM | POA: Diagnosis not present

## 2016-07-31 DIAGNOSIS — G20 Parkinson's disease: Secondary | ICD-10-CM | POA: Diagnosis not present

## 2016-08-07 DIAGNOSIS — R251 Tremor, unspecified: Secondary | ICD-10-CM | POA: Diagnosis not present

## 2016-08-07 DIAGNOSIS — M25512 Pain in left shoulder: Secondary | ICD-10-CM | POA: Diagnosis not present

## 2016-08-07 DIAGNOSIS — G20 Parkinson's disease: Secondary | ICD-10-CM | POA: Diagnosis not present

## 2016-08-09 DIAGNOSIS — G20 Parkinson's disease: Secondary | ICD-10-CM | POA: Diagnosis not present

## 2016-08-09 DIAGNOSIS — M25512 Pain in left shoulder: Secondary | ICD-10-CM | POA: Diagnosis not present

## 2016-08-09 DIAGNOSIS — R251 Tremor, unspecified: Secondary | ICD-10-CM | POA: Diagnosis not present

## 2016-08-14 DIAGNOSIS — R251 Tremor, unspecified: Secondary | ICD-10-CM | POA: Insufficient documentation

## 2016-08-14 DIAGNOSIS — G20 Parkinson's disease: Secondary | ICD-10-CM | POA: Diagnosis not present

## 2016-08-16 DIAGNOSIS — R251 Tremor, unspecified: Secondary | ICD-10-CM | POA: Diagnosis not present

## 2016-08-16 DIAGNOSIS — G20 Parkinson's disease: Secondary | ICD-10-CM | POA: Diagnosis not present

## 2016-08-16 DIAGNOSIS — M25512 Pain in left shoulder: Secondary | ICD-10-CM | POA: Diagnosis not present

## 2016-08-21 DIAGNOSIS — R251 Tremor, unspecified: Secondary | ICD-10-CM | POA: Diagnosis not present

## 2016-08-21 DIAGNOSIS — G20 Parkinson's disease: Secondary | ICD-10-CM | POA: Diagnosis not present

## 2016-08-21 DIAGNOSIS — M25512 Pain in left shoulder: Secondary | ICD-10-CM | POA: Diagnosis not present

## 2016-08-23 DIAGNOSIS — G20A1 Parkinson's disease without dyskinesia, without mention of fluctuations: Secondary | ICD-10-CM | POA: Insufficient documentation

## 2016-08-23 DIAGNOSIS — F02C3 Dementia in other diseases classified elsewhere, severe, with mood disturbance: Secondary | ICD-10-CM | POA: Insufficient documentation

## 2016-08-28 DIAGNOSIS — R251 Tremor, unspecified: Secondary | ICD-10-CM | POA: Diagnosis not present

## 2016-08-28 DIAGNOSIS — M25512 Pain in left shoulder: Secondary | ICD-10-CM | POA: Diagnosis not present

## 2016-08-28 DIAGNOSIS — G20 Parkinson's disease: Secondary | ICD-10-CM | POA: Diagnosis not present

## 2016-08-30 DIAGNOSIS — M25512 Pain in left shoulder: Secondary | ICD-10-CM | POA: Diagnosis not present

## 2016-08-30 DIAGNOSIS — R251 Tremor, unspecified: Secondary | ICD-10-CM | POA: Diagnosis not present

## 2016-08-30 DIAGNOSIS — G20 Parkinson's disease: Secondary | ICD-10-CM | POA: Diagnosis not present

## 2016-08-31 DIAGNOSIS — R251 Tremor, unspecified: Secondary | ICD-10-CM | POA: Diagnosis not present

## 2016-08-31 DIAGNOSIS — M25512 Pain in left shoulder: Secondary | ICD-10-CM | POA: Diagnosis not present

## 2016-08-31 DIAGNOSIS — G20 Parkinson's disease: Secondary | ICD-10-CM | POA: Diagnosis not present

## 2016-08-31 DIAGNOSIS — R42 Dizziness and giddiness: Secondary | ICD-10-CM | POA: Diagnosis not present

## 2016-08-31 DIAGNOSIS — H409 Unspecified glaucoma: Secondary | ICD-10-CM | POA: Diagnosis not present

## 2016-08-31 DIAGNOSIS — M25612 Stiffness of left shoulder, not elsewhere classified: Secondary | ICD-10-CM | POA: Diagnosis not present

## 2016-09-05 DIAGNOSIS — M25612 Stiffness of left shoulder, not elsewhere classified: Secondary | ICD-10-CM | POA: Diagnosis not present

## 2016-09-05 DIAGNOSIS — H409 Unspecified glaucoma: Secondary | ICD-10-CM | POA: Diagnosis not present

## 2016-09-05 DIAGNOSIS — G20 Parkinson's disease: Secondary | ICD-10-CM | POA: Diagnosis not present

## 2016-09-05 DIAGNOSIS — R42 Dizziness and giddiness: Secondary | ICD-10-CM | POA: Diagnosis not present

## 2016-09-05 DIAGNOSIS — M25512 Pain in left shoulder: Secondary | ICD-10-CM | POA: Diagnosis not present

## 2016-09-05 DIAGNOSIS — R251 Tremor, unspecified: Secondary | ICD-10-CM | POA: Diagnosis not present

## 2016-09-06 ENCOUNTER — Emergency Department
Admission: EM | Admit: 2016-09-06 | Discharge: 2016-09-07 | Disposition: A | Discharge status: Home / Self Care | Payer: Medicare Other | Attending: Emergency Medicine | Admitting: Emergency Medicine

## 2016-09-06 ENCOUNTER — Emergency Department: Payer: Medicare Other

## 2016-09-06 ENCOUNTER — Encounter: Payer: Self-pay | Admitting: Emergency Medicine

## 2016-09-06 DIAGNOSIS — M4854XA Collapsed vertebra, not elsewhere classified, thoracic region, initial encounter for fracture: Secondary | ICD-10-CM

## 2016-09-06 DIAGNOSIS — K219 Gastro-esophageal reflux disease without esophagitis: Secondary | ICD-10-CM | POA: Diagnosis not present

## 2016-09-06 DIAGNOSIS — I10 Essential (primary) hypertension: Secondary | ICD-10-CM

## 2016-09-06 DIAGNOSIS — R1084 Generalized abdominal pain: Secondary | ICD-10-CM | POA: Diagnosis present

## 2016-09-06 DIAGNOSIS — R1010 Upper abdominal pain, unspecified: Secondary | ICD-10-CM | POA: Diagnosis not present

## 2016-09-06 DIAGNOSIS — R109 Unspecified abdominal pain: Secondary | ICD-10-CM | POA: Diagnosis not present

## 2016-09-06 DIAGNOSIS — Z79899 Other long term (current) drug therapy: Secondary | ICD-10-CM | POA: Insufficient documentation

## 2016-09-06 DIAGNOSIS — R0789 Other chest pain: Secondary | ICD-10-CM | POA: Diagnosis not present

## 2016-09-06 DIAGNOSIS — S22000A Wedge compression fracture of unspecified thoracic vertebra, initial encounter for closed fracture: Secondary | ICD-10-CM | POA: Diagnosis not present

## 2016-09-06 HISTORY — DX: Parkinson's disease: G20

## 2016-09-06 HISTORY — DX: Parkinson's disease without dyskinesia, without mention of fluctuations: G20.A1

## 2016-09-06 LAB — COMPREHENSIVE METABOLIC PANEL
ALT: 28 U/L (ref 14–54)
AST: 27 U/L (ref 15–41)
Albumin: 4.1 g/dL (ref 3.5–5.0)
Alkaline Phosphatase: 111 U/L (ref 38–126)
Anion gap: 6 (ref 5–15)
BILIRUBIN TOTAL: 0.9 mg/dL (ref 0.3–1.2)
BUN: 21 mg/dL — ABNORMAL HIGH (ref 6–20)
CHLORIDE: 102 mmol/L (ref 101–111)
CO2: 31 mmol/L (ref 22–32)
CREATININE: 0.84 mg/dL (ref 0.44–1.00)
Calcium: 9.6 mg/dL (ref 8.9–10.3)
Glucose, Bld: 110 mg/dL — ABNORMAL HIGH (ref 65–99)
POTASSIUM: 3.6 mmol/L (ref 3.5–5.1)
Sodium: 139 mmol/L (ref 135–145)
Total Protein: 7 g/dL (ref 6.5–8.1)

## 2016-09-06 LAB — CBC WITH DIFFERENTIAL/PLATELET
Basophils Absolute: 0 10*3/uL (ref 0–0.1)
Basophils Relative: 0 %
EOS PCT: 0 %
Eosinophils Absolute: 0 10*3/uL (ref 0–0.7)
HCT: 38.1 % (ref 35.0–47.0)
Hemoglobin: 13.6 g/dL (ref 12.0–16.0)
LYMPHS PCT: 13 %
Lymphs Abs: 0.9 10*3/uL — ABNORMAL LOW (ref 1.0–3.6)
MCH: 33.8 pg (ref 26.0–34.0)
MCHC: 35.8 g/dL (ref 32.0–36.0)
MCV: 94.5 fL (ref 80.0–100.0)
MONOS PCT: 9 %
Monocytes Absolute: 0.6 10*3/uL (ref 0.2–0.9)
Neutro Abs: 5.8 10*3/uL (ref 1.4–6.5)
Neutrophils Relative %: 78 %
PLATELETS: 194 10*3/uL (ref 150–440)
RBC: 4.03 MIL/uL (ref 3.80–5.20)
RDW: 13.5 % (ref 11.5–14.5)
WBC: 7.4 10*3/uL (ref 3.6–11.0)

## 2016-09-06 LAB — LIPASE, BLOOD: LIPASE: 41 U/L (ref 11–51)

## 2016-09-06 MED ORDER — AMLODIPINE BESYLATE 5 MG PO TABS: 2.5000 mg | ORAL_TABLET | Freq: Every day | ORAL | 0 refills | Status: DC

## 2016-09-06 MED ORDER — TRAMADOL HCL 50 MG PO TABS: 50.0000 mg | ORAL_TABLET | Freq: Four times a day (QID) | ORAL | 0 refills | Status: DC | PRN

## 2016-09-06 MED ORDER — SODIUM CHLORIDE 0.9 % IV BOLUS (SEPSIS)
500.0000 mL | Freq: Once | INTRAVENOUS | Status: AC
  Administered 2016-09-06: 500 mL via INTRAVENOUS

## 2016-09-06 MED ORDER — NAPROXEN 250 MG PO TABS: 250.0000 mg | ORAL_TABLET | Freq: Two times a day (BID) | ORAL | 0 refills | Status: DC

## 2016-09-06 MED ORDER — FAMOTIDINE 20 MG PO TABS: 20.0000 mg | ORAL_TABLET | Freq: Two times a day (BID) | ORAL | 0 refills | Status: DC

## 2016-09-06 MED ORDER — IOPAMIDOL (ISOVUE-300) INJECTION 61%
75.0000 mL | Freq: Once | INTRAVENOUS | Status: AC | PRN
  Administered 2016-09-06: 75 mL via INTRAVENOUS

## 2016-09-06 MED ORDER — IOPAMIDOL (ISOVUE-300) INJECTION 61%
30.0000 mL | INTRAVENOUS | Status: AC
  Administered 2016-09-06: 30 mL via ORAL

## 2016-09-06 NOTE — ED Notes (Signed)
Pt placed into a hospital bed after ambulating to the bathroom. Ate all her dinner. Given water and showed how to use the bed adjustment.

## 2016-09-06 NOTE — NC FL2 (Deleted)
  [place] MEDICAID FL2 LEVEL OF CARE SCREENING TOOL     IDENTIFICATION  Patient Name: Cheryl Cummings Birthdate: 05/09/1941 Sex: female Admission Date (Current Location): 09/06/2016  [place]   Facility and Address:  [facility], [address], [place], [place] [address]      Provider Number: [id number]  Attending Physician Name and Address:  No att. providers found  Relative Name and Phone Number:  Lance MorinJOHNSON,TAMRA  [phone]     Current Level of Care: Hospital Recommended Level of Care: Skilled Nursing Facility Prior Approval Number:    Date Approved/Denied:   PASRR Number: [id number] [id number]  Discharge Plan: SNF    Current Diagnoses: Patient Active Problem List   Diagnosis Date Noted  . Syncope 02/29/2016    Orientation RESPIRATION BLADDER Height & Weight     Self, Situation, Place, Time  Normal Continent Weight: 100 lb 1.4 oz (45.4 kg) Height:     BEHAVIORAL SYMPTOMS/MOOD NEUROLOGICAL BOWEL NUTRITION STATUS      Continent Diet  AMBULATORY STATUS COMMUNICATION OF NEEDS Skin   Limited Assist Verbally Normal                       Personal Care Assistance Level of Assistance  Bathing, Feeding, Dressing Bathing Assistance: Limited assistance Feeding assistance: Independent Dressing Assistance: Limited assistance     Functional Limitations Info  Sight, Hearing, Speech Sight Info: Adequate Hearing Info: Adequate Speech Info: Adequate    SPECIAL CARE FACTORS FREQUENCY  PT (By licensed PT)     PT Frequency: 5X a week              Contractures Contractures Info: Not present    Additional Factors Info  Code Status, Allergies Code Status Info: Full Code Allergies Info: IBUPROFEN, MUSHROOM EXTRACT COMPLEX, VALIUM DIAZEPAM, SHELLFISH ALLERGY           Current Medications (09/06/2016):  This is the current hospital active medication list No current facility-administered  medications for this encounter.    Current Outpatient Prescriptions  Medication Sig Dispense Refill  . carbidopa-levodopa (SINEMET IR) 25-100 MG tablet Take 1 tablet by mouth 3 (three) times daily.    . dorzolamide-timolol (COSOPT) 22.3-6.8 MG/ML ophthalmic solution Place 1 drop into both eyes 2 (two) times daily.    . TRAVATAN Z 0.004 % SOLN ophthalmic solution Place 1 drop into both eyes at bedtime.    Marland Kitchen. amLODipine (NORVASC) 5 MG tablet Take 0.5 tablets (2.5 mg total) by mouth daily. 30 tablet 0  . famotidine (PEPCID) 20 MG tablet Take 1 tablet (20 mg total) by mouth 2 (two) times daily. 60 tablet 0  . naproxen (NAPROSYN) 250 MG tablet Take 1 tablet (250 mg total) by mouth 2 (two) times daily with a meal. 40 tablet 0  . traMADol (ULTRAM) 50 MG tablet Take 1 tablet (50 mg total) by mouth every 6 (six) hours as needed. 15 tablet 0     Discharge Medications: Please see discharge summary for a list of discharge medications.  Relevant Imaging Results:  Relevant Lab Results:   Additional Information SSN [id number]  Darleene Cleavernterhaus, Ayra Hodgdon R, [place]

## 2016-09-06 NOTE — ED Notes (Signed)
Patient has about left of contrast. Reminded patient she needed to finish drinking it

## 2016-09-06 NOTE — ED Notes (Signed)
Pt up to BR with one assist

## 2016-09-06 NOTE — ED Provider Notes (Signed)
I did speak with Social Worker who has arranged for patient to be accepted to nursing home, tomorrow.  I signed the FL-2 that was prepared by the Social worker in advance of transfer in the morning.   Governor Rooksebecca Xai Frerking, MD 09/06/16 [phone]

## 2016-09-06 NOTE — ED Notes (Signed)
Assisted patient with ambulation to the bathroom 

## 2016-09-06 NOTE — ED Notes (Signed)
Patient transported to X-ray 

## 2016-09-06 NOTE — ED Notes (Signed)
Daughter in with patient

## 2016-09-06 NOTE — Clinical Social Work Note (Signed)
Clinical Social Work Assessment  Patient Details  Name: Cheryl Cummings MRN: [id number] Date of Birth: [birth year]  Date of referral:  09/06/16               Reason for consult:  Facility Placement                Permission sought to share information with:  Facility Medical sales representativeContact Representative, Family Supports Permission granted to share information::  Yes, Verbal Permission Granted  Name::     Lance MorinJOHNSON,TAMRA  [phone] and Cleatis PolkaBuffington, [name] [phone]  Agency::  SNF admissions  Relationship::     Contact Information:     Housing/Transportation Living arrangements for the past 2 months:  Single Family Home Source of Information:  Patient, Adult Children Patient Interpreter Needed:  None Criminal Activity/Legal Involvement Pertinent to Current Situation/Hospitalization:  No - Comment as needed Significant Relationships:  Adult Children Lives with:  Self Do you feel safe going back to the place where you live?  No Need for family participation in patient care:  Yes (Comment)  Care giving concerns:  Patient and family would like to go to SNF for short term rehab.   Social Worker assessment / plan:  Patient is a 76 year old female who is alert and oriented x4 and able to express her feelings.  Patient states she has not been to rehab for CSW explained to patient and he family what to expect and what the process is for SNF placement.  CSW explained to patient and her family that since she has not had a qualifying stay in hospital of 3 inpatient days they will have to either go home with home health or go to SNF and pay privately.  CSW explained to patient and her family what the cost would be and how much payment the SNFs usually need in advance.  Patient and her family are in agreement to paying privately for SNF verses going home with home health.  Patient and family did not express any other issues or concerns.  Patient and her family were very appreciative of CSW help and  they gave CSW permission [place].  Employment status:  Retired Health and safety inspectornsurance information:  Medicare PT Recommendations:  Skilled Nursing Facility Information / Referral to community resources:  Skilled Nursing Facility  Patient/Family's Response to care:  Paitent and family agreeable to going to SNF for short term rehab.  Patient/Family's Understanding of and Emotional Response to Diagnosis, Current Treatment, and Prognosis:  Patient expressed she would rather not go to SNF, but she realizes that she will need some extra help before she is able to go home.  She is hopeful she will not have to be at SNF very long.  Emotional Assessment Appearance:  Appears stated age Attitude/Demeanor/Rapport:    Affect (typically observed):  Appropriate, Calm, Pleasant Orientation:  Oriented to Self, Oriented to Place, Oriented to  Time, Oriented to Situation Alcohol / Substance use:  Not Applicable Psych involvement (Current and /or in the community):  No (Comment)  Discharge Needs  Concerns to be addressed:  Lack of Support Readmission within the last 30 days:  No Current discharge risk:  Lack of support system, Lives alone Barriers to Discharge:  Continued Medical Work up   [place] Const[place], Valary Manahan R, LCSWA 09/06/2016, 5:42 PM

## 2016-09-06 NOTE — ED Provider Notes (Signed)
[facility] Emergency Department Provider Note   First MD Initiated Contact with Patient 09/06/16 [phone]     (approximate)  I have reviewed the triage vital signs and the nursing notes.   HISTORY  Chief Complaint Abdominal Pain   HPI Cheryl Cummings is a 76 y.o. female presents with 2 week history of generalized abdominal pain with acute worsening tonight. Patient states current pain score is 10 out of 10. Patient also notes abdominal distention. Patient denies any nausea vomiting or diarrhea. Patient denies any aspiration or fever.   Past Medical History:  Diagnosis Date  . Parkinson disease [facility](HCC)     Patient Active Problem List   Diagnosis Date Noted  . Syncope 02/29/2016    History reviewed. No pertinent surgical history.  Prior to Admission medications   Medication Sig Start Date End Date Taking? Authorizing Provider  carbidopa-levodopa (SINEMET IR) 25-100 MG tablet Take 1 tablet by mouth 3 (three) times daily. 08/14/16  Yes Historical Provider, MD  dorzolamide-timolol (COSOPT) 22.3-6.8 MG/ML ophthalmic solution Place 1 drop into both eyes 2 (two) times daily.   Yes Historical Provider, MD  TRAVATAN Z 0.004 % SOLN ophthalmic solution Place 1 drop into both eyes at bedtime.   Yes Historical Provider, MD  amLODipine (NORVASC) 5 MG tablet Take 0.5 tablets (2.5 mg total) by mouth daily. 09/06/16   Sharman CheekPhillip Stafford, MD  famotidine (PEPCID) 20 MG tablet Take 1 tablet (20 mg total) by mouth 2 (two) times daily. 09/06/16   Sharman CheekPhillip Stafford, MD  naproxen (NAPROSYN) 250 MG tablet Take 1 tablet (250 mg total) by mouth 2 (two) times daily with a meal. 09/06/16   Sharman CheekPhillip Stafford, MD  traMADol (ULTRAM) 50 MG tablet Take 1 tablet (50 mg total) by mouth every 6 (six) hours as needed. 09/06/16   Sharman CheekPhillip Stafford, MD    Allergies Ibuprofen; Mushroom extract complex; Valium [diazepam]; and Shellfish allergy  No family history on file.  Social History Social  History  Substance Use Topics  . Smoking status: Never Smoker  . Smokeless tobacco: Never Used  . Alcohol use No    Review of Systems Constitutional: No fever/chills Eyes: No visual changes. ENT: No sore throat. Cardiovascular: Denies chest pain. Respiratory: Denies shortness of breath. Gastrointestinal: Positive for abdominal pain.  No nausea, no vomiting.  No diarrhea.  No constipation. Genitourinary: Negative for dysuria. Musculoskeletal: Negative for back pain. Skin: Negative for rash. Neurological: Negative for headaches, focal weakness or numbness.  10-point ROS otherwise negative.  ____________________________________________   PHYSICAL EXAM:  VITAL SIGNS: ED Triage Vitals  Enc Vitals Group     BP 09/06/16 0329 (!) 194/108     Pulse Rate 09/06/16 0329 75     Resp 09/06/16 0329 17     Temp 09/06/16 0329 97.7 F (36.5 C)     Temp Source 09/06/16 0329 Oral     SpO2 09/06/16 0329 99 %     Weight 09/06/16 0329 100 lb 1.4 oz (45.4 kg)     Height --      Head Circumference --      Peak Flow --      Pain Score 09/06/16 0330 10     Pain Loc --      Pain Edu? --      Excl. in GC? --     Constitutional: Alert and oriented. Well appearing and in no acute distress. Eyes: Conjunctivae are normal. PERRL. EOMI. Head: Atraumatic. Mouth/Throat: Mucous membranes are moist.  Oropharynx  non-erythematous. Neck: No stridor.   Cardiovascular: Normal rate, regular rhythm. Good peripheral circulation. Grossly normal heart sounds. Respiratory: Normal respiratory effort.  No retractions. Lungs CTAB. Gastrointestinal: Generalized tenderness to palpation positive distention.  Musculoskeletal: No lower extremity tenderness nor edema. No gross deformities of extremities. Neurologic:  Normal speech and language. No gross focal neurologic deficits are appreciated.  Skin:  Skin is warm, dry and intact. No rash noted. Psychiatric: Mood and affect are normal. Speech and behavior are  normal.  ____________________________________________   LABS (all labs ordered are listed, but only abnormal results are displayed)  Labs Reviewed  CBC WITH DIFFERENTIAL/PLATELET - Abnormal; Notable for the following:       Result Value   Lymphs Abs 0.9 (*)    All other components within normal limits  COMPREHENSIVE METABOLIC PANEL - Abnormal; Notable for the following:    Glucose, Bld 110 (*)    BUN 21 (*)    All other components within normal limits  URINE CULTURE  LIPASE, BLOOD  URINALYSIS, COMPLETE (UACMP) WITH MICROSCOPIC    RADIOLOGY I, [place] N Lenzie Montesano, personally viewed and evaluated these images (plain radiographs) as part of my medical decision making, as well as reviewing the written report by the radiologist.  Dg Chest 2 View  Result Date: 09/06/2016 CLINICAL DATA:  76 year old female with persistent left chest wall pain after a fall 1 month ago. 2-3 months of back pain. Initial encounter. EXAM: CHEST  2 VIEW COMPARISON:  CT Abdomen and Pelvis today reported separately. FINDINGS: Osteopenia with at least 3 levels of mid thoracic vertebral body compression on the lateral view (arrows). Subsequent exaggerated thoracic kyphosis. Levoconvex thoracolumbar scoliosis. No acute displaced rib fracture identified. Upper limits of normal to mildly hyperinflated lung volumes. Cardiac size is at the upper limits of normal. Visualized tracheal air column is within normal limits. No pneumothorax, pulmonary edema, pleural effusion or confluent pulmonary opacity. IMPRESSION: 1. Osteopenia with at least 3 levels of age indeterminate thoracic spine compression fractures. If specific therapy such as vertebroplasty is desired, Thoracic MRI (noncontrast) or Nuclear Medicine Whole-body Bone Scan would best determine acuity. 2.  No acute cardiopulmonary abnormality. Electronically Signed   By: Odessa Fleming M.D.   On: 09/06/2016 09:59   Ct Abdomen Pelvis W Contrast  Result Date: 09/06/2016 CLINICAL DATA:   Abdominal pain for 2 weeks EXAM: CT ABDOMEN AND PELVIS WITH CONTRAST TECHNIQUE: Multidetector CT imaging of the abdomen and pelvis was performed using the standard protocol following bolus administration of intravenous contrast. Oral contrast was also administered. CONTRAST:  75mL ISOVUE-300 IOPAMIDOL (ISOVUE-300) INJECTION 61% COMPARISON:  November 01, 2013 FINDINGS: Lower chest: There is bibasilar atelectatic change. There is a small pleural effusion on each side. Hepatobiliary: No focal liver lesions are evident. Gallbladder wall appears borderline thickened. No pericholecystic fluid. No biliary duct dilatation. Pancreas: No pancreatic mass or inflammatory focus. Spleen: No splenic lesions are evident. Adrenals/Urinary Tract: Adrenals appear unremarkable. Kidneys bilaterally show no demonstrable mass or hydronephrosis on either side. There is no renal or ureteral calculus on either side. Urinary bladder is midline with wall thickness within normal limits. Stomach/Bowel: Rectum is mildly distended with air. There is no appreciable bowel wall or mesenteric thickening. No bowel obstruction. No free air or portal venous air. Vascular/Lymphatic: There is occasional foci of atherosclerotic calcification in the aorta. There is no abdominal aortic aneurysm. Major mesenteric vessels are patent. There is no evident adenopathy in the abdomen or pelvis. Reproductive: There is no pelvic mass or pelvic  fluid collection. Other: Appendix region appears normal. There is no abscess in the abdomen pelvis. There is a small amount of ascites in the rightward aspect of the pelvis posteriorly. Musculoskeletal: There is thoracolumbar levoscoliosis. There is degenerative change in the lumbar spine. There are no blastic or lytic bone lesions. There is no abdominal wall or intramuscular lesion. IMPRESSION: Mild ascites in the dependent portion of the pelvis posteriorly of uncertain etiology. No ascites elsewhere. No bowel obstruction. No  abscess. Gallbladder wall appears borderline thickened. Gallbladder is mildly contracted. Correlation with ultrasound of the gallbladder after minimum of 8 hours fasting advised. No renal or ureteral calculus. No hydronephrosis. Urinary bladder wall is not thickened. Small pleural effusions bilaterally with bibasilar atelectatic change. Electronically Signed   By: Bretta Bang III M.D.   On: 09/06/2016 07:09     Procedures    INITIAL IMPRESSION / ASSESSMENT AND PLAN / ED COURSE  Pertinent labs & imaging results that were available during my care of the patient were reviewed by me and considered in my medical decision making (see chart for details).  Patient presenting with 2 week history of generalized abdominal pain. CT scan of the abdomen and pelvis pending. Patient's care transferred to Dr. Scotty Court      ____________________________________________  FINAL CLINICAL IMPRESSION(S) / ED DIAGNOSES  Final diagnoses:  Compression fracture of thoracic spine, non-traumatic, initial encounter (HCC)  Gastroesophageal reflux disease, esophagitis presence not specified  Hypertension, unspecified type     MEDICATIONS GIVEN DURING THIS VISIT:  Medications  iopamidol (ISOVUE-300) 61 % injection 30 mL (30 mLs Oral Contrast Given 09/06/16 0407)  iopamidol (ISOVUE-300) 61 % injection 75 mL (75 mLs Intravenous Contrast Given 09/06/16 0650)  sodium chloride 0.9 % bolus 500 mL (0 mLs Intravenous Stopped 09/06/16 1905)     NEW OUTPATIENT MEDICATIONS STARTED DURING THIS VISIT:  New Prescriptions   AMLODIPINE (NORVASC) 5 MG TABLET    Take 0.5 tablets (2.5 mg total) by mouth daily.   FAMOTIDINE (PEPCID) 20 MG TABLET    Take 1 tablet (20 mg total) by mouth 2 (two) times daily.   NAPROXEN (NAPROSYN) 250 MG TABLET    Take 1 tablet (250 mg total) by mouth 2 (two) times daily with a meal.   TRAMADOL (ULTRAM) 50 MG TABLET    Take 1 tablet (50 mg total) by mouth every 6 (six) hours as needed.     Modified Medications   No medications on file    Discontinued Medications   No medications on file     Note:  This document was prepared using Dragon voice recognition software and may include unintentional dictation errors.    Darci Current, MD 09/07/16 [id number]

## 2016-09-06 NOTE — ED Triage Notes (Signed)
Patient comes from home via [facility] with abdominal pain for the last 2 weeks but worse tonight. Patient also having left shoulder pain not sure if that is from her Parkinson's disease. Per EMS patient was hypertensive.

## 2016-09-06 NOTE — ED Notes (Signed)
Pt will be going to a snif in the morning.

## 2016-09-06 NOTE — ED Notes (Signed)
PT in now to eval.

## 2016-09-06 NOTE — ED Notes (Signed)
Patient ambulated to bathroom with 1 person assist.  

## 2016-09-06 NOTE — ED Notes (Signed)
Pt 1 assist to toilet by this RN. Back in bed at this time. No complications.

## 2016-09-06 NOTE — Evaluation (Signed)
Physical Therapy Evaluation Patient Details Name: Cheryl Cummings MRN: [id number] DOB: 02/14/1941 Today's Date: 09/06/2016   History of Present Illness  76 y/o female in ED with abdominal pain for the last 2 weeks. Patient also having thoracic spine/rib pain and left shoulder pain.  Pt has Parkinson's disease, significant scoliosis with multiple T-spine compression fractures.   Clinical Impression  Pt with excessively slow mobility, transfers and ambulation.  She was able to do most activities w/o heavy assist but needed constant cuing, encouragement and generally did not display appropriate/safe function to be at home alone at this time.  Pt reports he is not too far from her baseline but was exceptionally slow with ambulation, needed total cuing for bed mobility and generally did not seem steady or confident with most acts.  She also c/o pain in mid back likely related to significant scoliotic curvature with thoracic compression fractures.  Pt not sure about going to rehab, but that option is really the only reasonable one from a rehab/safety stand-point.     Follow Up Recommendations SNF (Pt feeling like she wants to go home with HHPT regardless)    Equipment Recommendations       Recommendations for Other Services       Precautions / Restrictions Precautions Precautions: Fall Restrictions Weight Bearing Restrictions: No      Mobility  Bed Mobility Overal bed mobility: Needs Assistance Bed Mobility: Supine to Sit;Sit to Supine     Supine to sit: Min assist Sit to supine: Min assist   General bed mobility comments: Pt extremely slow and guarded with getting in/out of bed, she was able to do so w/o a lot of assist but needed constant cuing, encouragement and generally had little confidence   Transfers Overall transfer level: Needs assistance Equipment used: 1 person hand held assist Transfers: Sit to/from Stand Sit to Stand: Min assist         General transfer  comment: Pt did not need excessive assist to get to and maintain standing, but again was very slow, deliberate, hesistant and generally did not seem confident   Ambulation/Gait Ambulation/Gait assistance: Min assist Ambulation Distance (Feet): 25 Feet Assistive device: 1 person hand held assist;None       General Gait Details: Pt with excessively slow, cautious ambulation with 10 ft walk exceeding 3 minutes.  She did not have any LOBs but was generally slow, uinstady and did not have gait congruent with living independently at home  Stairs            Wheelchair Mobility    Modified Rankin (Stroke Patients Only)       Balance Overall balance assessment: Needs assistance Sitting-balance support: Single extremity supported Sitting balance-Leahy Scale: Fair Sitting balance - Comments: Pt with leaning back and to the L in sitting, scoliosis limited posture   Standing balance support: Single extremity supported;No upper extremity supported Standing balance-Leahy Scale: Fair Standing balance comment: Pt did not have any LOBs but again was very cautious, guarded and lacking confidence with standing/walking balnace                             Pertinent Vitals/Pain Pain Assessment: 0-10 Pain Score: 7  Pain Location: mid back    Home Living Family/patient expects to be discharged to:: Private residence Living Arrangements: Alone Available Help at Discharge: Family (daughter gets her groceries and helps with meals weekly) Type of Home: House Home Access: Stairs to enter  Entrance Stairs-Rails: None Entrance Stairs-Number of Steps: 2 Home Layout: Multi-level Home Equipment: Cane - quad (has life-alert)      Prior Function           Comments: Pt with very slow, deliberate mobility.  Does not drive and is reliant on family, etc for all errands. Pt has had numerous falls and/or black out episodes.     Hand Dominance        Extremity/Trunk Assessment    Upper Extremity Assessment Upper Extremity Assessment: LUE deficits/detail;Generalized weakness (R UE grossly 3+/5 with limited shoulder elevation) LUE Deficits / Details: L UE grossly 2/5    Lower Extremity Assessment Lower Extremity Assessment: Generalized weakness (grossly 3+ to 4-/5 t/o)       Communication   Communication: No difficulties  Cognition Arousal/Alertness: Awake/alert Behavior During Therapy: Anxious Overall Cognitive Status: Difficult to assess                 General Comments: Pt tearful when talking about not driving, seems anxious about going to rehab    General Comments      Exercises     Assessment/Plan    PT Assessment Patient needs continued PT services  PT Problem List Decreased strength;Decreased range of motion;Decreased activity tolerance;Decreased balance;Decreased mobility;Decreased coordination;Decreased knowledge of use of DME;Decreased safety awareness;Decreased knowledge of precautions;Pain       PT Treatment Interventions DME instruction;Gait training;Stair training;Functional mobility training;Therapeutic activities;Therapeutic exercise;Balance training;Neuromuscular re-education;Patient/family education;Cognitive remediation    PT Goals (Current goals can be found in the Care Plan section)  Acute Rehab PT Goals Patient Stated Goal: be able to go home safely  PT Goal Formulation: With patient Time For Goal Achievement: 09/20/16 Potential to Achieve Goals: Fair    Frequency Min 2X/week   Barriers to discharge        Co-evaluation               End of Session Equipment Utilized During Treatment: Gait belt Activity Tolerance: Patient limited by fatigue Patient left: with nursing/sitter in room;in bed Nurse Communication: Mobility status PT Visit Diagnosis: Unsteadiness on feet (R26.81);Muscle weakness (generalized) (M62.81);Difficulty in walking, not elsewhere classified (R26.2)    Functional Assessment Tool Used:  AM-PAC 6 Clicks Basic Mobility Functional Limitation: Mobility: Walking and moving around Mobility: Walking and Moving Around Current Status (Z6109): At least 40 percent but less than 60 percent impaired, limited or restricted Mobility: Walking and Moving Around Goal Status [phone]): At least 1 percent but less than 20 percent impaired, limited or restricted    Time: 1139-1214 PT Time Calculation (min) (ACUTE ONLY): 35 min   Charges:   PT Evaluation $PT Eval Moderate Complexity: 1 Procedure PT Treatments $Therapeutic Activity: 8-22 mins   PT G Codes:   PT G-Codes **NOT FOR INPATIENT CLASS** Functional Assessment Tool Used: AM-PAC 6 Clicks Basic Mobility Functional Limitation: Mobility: Walking and moving around Mobility: Walking and Moving Around Current Status (U9811): At least 40 percent but less than 60 percent impaired, limited or restricted Mobility: Walking and Moving Around Goal Status [phone]): At least 1 percent but less than 20 percent impaired, limited or restricted     Malachi Pro, DPT 09/06/2016, 3:09 PM

## 2016-09-06 NOTE — Clinical Social Work Placement (Signed)
   CLINICAL SOCIAL WORK PLACEMENT  NOTE  Date:  09/06/2016  Patient Details  Name: Cheryl Cummings MRN: [id number] Date of Birth: 13-Jul-1941  Clinical Social Work is seeking post-discharge placement for this patient at the Skilled  Nursing Facility level of care (*CSW will initial, date and re-position this form in  chart as items are completed):  Yes   Patient/family provided with [facility] Clinical Social Work Department's list of facilities offering this level of care within the geographic area requested by the patient (or if unable, by the patient's family).  Yes   Patient/family informed of their freedom to choose among providers that offer the needed level of care, that participate in Medicare, Medicaid or managed care program needed by the patient, have an available bed and are willing to accept the patient.  Yes   Patient/family informed of [facility]'s ownership interest in [facility] and [facility], as well as of the fact that they are under no obligation to receive care at these facilities.  PASRR submitted to EDS on 09/06/16     PASRR number received on 09/06/16     Existing PASRR number confirmed on       FL2 transmitted to all facilities in geographic area requested by pt/family on 09/06/16     FL2 transmitted to all facilities within larger geographic area on       Patient informed that his/her managed care company has contracts with or will negotiate with certain facilities, including the following:        Yes   Patient/family informed of bed offers received.  [facility]     Physician recommends and patient chooses bed at      [facility]  .  Patient to be transferred to facility [facility]     Patient family notified on   of transfer.  Name of family member notified:        PHYSICIAN Please sign FL2     Additional Comment:     _______________________________________________ Cheryl Cummings, Cheryl Cummings, LCSWA 09/06/2016, 5:48 PM

## 2016-09-06 NOTE — ED Notes (Signed)
This Clinical research associatewriter spoke with the daughter of whom of is in [place] at work and is concerned that we are releasing the pt without a dx. Will make the edp aware of daughters concern.

## 2016-09-06 NOTE — ED Notes (Signed)
Pt assisted to toilet in tx room for BM. No complications. Elimination WNL.

## 2016-09-06 NOTE — ED Notes (Signed)
Patient still working on contrast.

## 2016-09-06 NOTE — ED Notes (Signed)
Pt given a [place] sandwhich for lunch.

## 2016-09-06 NOTE — NC FL2 (Signed)
  [place] MEDICAID FL2 LEVEL OF CARE SCREENING TOOL     IDENTIFICATION  Patient Name: Cheryl Cummings Birthdate: Nov 07, 1940 Sex: female Admission Date (Current Location): 09/06/2016  [place]   Facility and Address:  [facility], [address], [place], [place] [address]      Provider Number: [id number]  Attending Physician Name and Address:  No att. providers found  Relative Name and Phone Number:  Lance MorinJOHNSON,TAMRA  [phone]   Cleatis PolkaDavid Seipp son [phone]  Current Level of Care: Hospital Recommended Level of Care: Skilled Nursing Facility Prior Approval Number:    Date Approved/Denied:   PASRR Number: [id number]  Discharge Plan: SNF    Current Diagnoses: Patient Active Problem List   Diagnosis Date Noted  . Syncope 02/29/2016    Orientation RESPIRATION BLADDER Height & Weight     Self, Situation, Place, Time  Normal Continent Weight: 100 lb 1.4 oz (45.4 kg) Height:     BEHAVIORAL SYMPTOMS/MOOD NEUROLOGICAL BOWEL NUTRITION STATUS      Continent Diet  AMBULATORY STATUS COMMUNICATION OF NEEDS Skin   Limited Assist Verbally Normal                       Personal Care Assistance Level of Assistance  Bathing, Feeding, Dressing Bathing Assistance: Limited assistance Feeding assistance: Independent Dressing Assistance: Limited assistance     Functional Limitations Info  Sight, Hearing, Speech Sight Info: Adequate Hearing Info: Adequate Speech Info: Adequate    SPECIAL CARE FACTORS FREQUENCY  PT (By licensed PT)     PT Frequency: 5X a week              Contractures Contractures Info: Not present    Additional Factors Info  Code Status, Allergies Code Status Info: Full Code Allergies Info: IBUPROFEN, MUSHROOM EXTRACT COMPLEX, VALIUM DIAZEPAM, SHELLFISH ALLERGY           Current Medications (09/06/2016):  This is the current hospital active medication list No  current facility-administered medications for this encounter.    Current Outpatient Prescriptions  Medication Sig Dispense Refill  . carbidopa-levodopa (SINEMET IR) 25-100 MG tablet Take 1 tablet by mouth 3 (three) times daily.    . dorzolamide-timolol (COSOPT) 22.3-6.8 MG/ML ophthalmic solution Place 1 drop into both eyes 2 (two) times daily.    . TRAVATAN Z 0.004 % SOLN ophthalmic solution Place 1 drop into both eyes at bedtime.    Marland Kitchen. amLODipine (NORVASC) 5 MG tablet Take 0.5 tablets (2.5 mg total) by mouth daily. 30 tablet 0  . famotidine (PEPCID) 20 MG tablet Take 1 tablet (20 mg total) by mouth 2 (two) times daily. 60 tablet 0  . naproxen (NAPROSYN) 250 MG tablet Take 1 tablet (250 mg total) by mouth 2 (two) times daily with a meal. 40 tablet 0  . traMADol (ULTRAM) 50 MG tablet Take 1 tablet (50 mg total) by mouth every 6 (six) hours as needed. 15 tablet 0     Discharge Medications: Please see discharge summary for a list of discharge medications.  Relevant Imaging Results:  Relevant Lab Results:   Additional Information SSN [id number]  Darleene Cleavernterhaus, Elliona Doddridge R, [place]

## 2016-09-06 NOTE — ED Notes (Signed)
Pt up to the toilet with medic assisting.

## 2016-09-06 NOTE — ED Provider Notes (Signed)
Assumed care from Dr. Manson PasseyBrown. CT negative except for mild ascites, unclear etiology. LFTs unremarkable.  Further discussion with patient and son, patient's abdominal pain is described as left upper quadrant, bleeding paying some sharp pain lasting less than a second, intermittent. Nonradiating. She has a history of GERD but reports that she never took an antacid medications. This is likely the cause of her pain given the negative workup. Recommend H2 blocker trial.  She also complains of left back and shoulder pain since a fall at home one month ago. She was never evaluated for this. She and her son note that she has worsening leaning to the right, and on exam she has significant rightward curvature of the thoracic spine. They note that this is new just in the last couple of months. She does have a history of osteoporosis.   At this point, I think that the back pain and shoulder pain appear to be a chronic musculoskeletal issue related to osteoporosis and developing severe curvature of her spine. I'll check a chest x-ray to evaluate for underlying bony injury.  She's not having any relief of her pain from Tylenol. With the finding of ascites on CT, I advised them to discontinue Tylenol. We'll plan to discharge the patient home after completing the workup with the following:  1 blood pressure medication for persistent stage III hypertension in the ED. 2 Aleve, Pepcid, tramadol for symptoms 3 follow up with primary care and gastroenterology referral. 4 continue home physical therapy as arranged.    ----------------------------------------- 10:52 AM on 09/06/2016 ----------------------------------------- Chest x-ray reveals 3 spinal compression fractures, as clinically expected. Not causing any significant neurologic involvement at this time but does explain the chronic pain. Follow-up with primary care. Continue home PT. Has follow-up with orthopedics next weeks which I encouraged her to continue.  Suitable for discharge home at this time.    Sharman CheekPhillip Chidinma Clites, MD 09/06/16 1053

## 2016-09-07 DIAGNOSIS — Z7401 Bed confinement status: Secondary | ICD-10-CM | POA: Diagnosis not present

## 2016-09-07 DIAGNOSIS — K219 Gastro-esophageal reflux disease without esophagitis: Secondary | ICD-10-CM | POA: Diagnosis not present

## 2016-09-07 DIAGNOSIS — R1010 Upper abdominal pain, unspecified: Secondary | ICD-10-CM | POA: Diagnosis not present

## 2016-09-07 MED ORDER — CLONIDINE HCL 0.1 MG PO TABS
0.2000 mg | ORAL_TABLET | Freq: Once | ORAL | Status: AC
  Administered 2016-09-07: 0.2 mg via ORAL
  Filled 2016-09-07: qty 2

## 2016-09-07 NOTE — Clinical Social Work Note (Signed)
CSW was informed by patient and her family that she would like to go to SNF.  CSW explained to patient and her family that patient has not had a qualifying stay so she would have to pay out of pocket.  Patient and family said they would [facility], CSW contacted [facility] worker [facility], and he informed CSW of what the cost would be for private room ($350 a day) and semi-private room ($200 a day).  CSW relayed this information to patient son Cleatis PolkaDavid Duncanson [phone] and daughter Barrie Dunkeramra who were both at bedside, patient's family have agreed to private pay.  CSW informed [facility] and also gave patient's son phone number to contact [place] in admissions to discuss billing.  [facility] said they can not take patient tonight because they will not be able to get meds for patient.  CSW updated bedside nurse and physician regarding patient needing to discharge on Friday to SNF, CSW updated Friday ED social worker via handoff.  Ervin KnackEric R. Camella Seim, MSW, Theresia MajorsLCSWA [phone]  09/07/2016 10:18 AM

## 2016-09-07 NOTE — ED Notes (Signed)
Patient needed to urinate. Placed on bedpan.

## 2016-09-07 NOTE — ED Notes (Signed)
Pt assisted up out of bed, walked to toilet.

## 2016-09-07 NOTE — Progress Notes (Signed)
LCS[facility] and [facility] are ready to accept patient. She is to transport by EMS  Room assigned E-14 Call report note [phone]  Consulted EDP Dr Huel [facility]igley and [facility]  No further needs   Delta Air LinesClaudine Mingo Siegert LCSW [phone]

## 2016-09-07 NOTE — ED Notes (Signed)
Pt repositioned in bed, given food tray and helped set up tray for pt to eat.

## 2016-09-07 NOTE — ED Notes (Signed)
Spoke to patients daughter. Updated on pts status and will [facility].

## 2016-09-07 NOTE — ED Notes (Signed)
Pt repositioned in bed. Given banana and water.

## 2016-09-07 NOTE — ED Notes (Signed)
Pt awaiting SW to further arrange placement [facility]. This RN reviewed SW note and did not see details pertaining to [facility], contacted facility-also denies awareness. Upon update, please call Daughter Cheryl Cummings at [phone]

## 2016-09-07 NOTE — ED Notes (Signed)
Patient needing to have a bm. Placed on bedpan.

## 2016-09-07 NOTE — ED Notes (Signed)
Pt assisted up put of bed to use bathroom. Pt changed into clothes. Patient waiting for EMS.

## 2016-09-07 NOTE — ED Notes (Signed)
Pt given meal tray. Repositioned in bed in order for pt to eat.

## 2016-09-08 DIAGNOSIS — K219 Gastro-esophageal reflux disease without esophagitis: Secondary | ICD-10-CM | POA: Diagnosis not present

## 2016-09-08 DIAGNOSIS — R41841 Cognitive communication deficit: Secondary | ICD-10-CM | POA: Diagnosis not present

## 2016-09-08 DIAGNOSIS — M4854XD Collapsed vertebra, not elsewhere classified, thoracic region, subsequent encounter for fracture with routine healing: Secondary | ICD-10-CM | POA: Diagnosis not present

## 2016-09-08 DIAGNOSIS — M6281 Muscle weakness (generalized): Secondary | ICD-10-CM | POA: Diagnosis not present

## 2016-09-08 DIAGNOSIS — R262 Difficulty in walking, not elsewhere classified: Secondary | ICD-10-CM | POA: Diagnosis not present

## 2016-09-08 DIAGNOSIS — G20 Parkinson's disease: Secondary | ICD-10-CM | POA: Diagnosis not present

## 2016-09-08 DIAGNOSIS — R1310 Dysphagia, unspecified: Secondary | ICD-10-CM | POA: Diagnosis not present

## 2016-09-10 DIAGNOSIS — G20 Parkinson's disease: Secondary | ICD-10-CM | POA: Diagnosis not present

## 2016-09-10 DIAGNOSIS — R262 Difficulty in walking, not elsewhere classified: Secondary | ICD-10-CM | POA: Diagnosis not present

## 2016-09-10 DIAGNOSIS — M6281 Muscle weakness (generalized): Secondary | ICD-10-CM | POA: Diagnosis not present

## 2016-09-10 DIAGNOSIS — M4854XD Collapsed vertebra, not elsewhere classified, thoracic region, subsequent encounter for fracture with routine healing: Secondary | ICD-10-CM | POA: Diagnosis not present

## 2016-09-10 DIAGNOSIS — K219 Gastro-esophageal reflux disease without esophagitis: Secondary | ICD-10-CM | POA: Diagnosis not present

## 2016-09-10 DIAGNOSIS — R1310 Dysphagia, unspecified: Secondary | ICD-10-CM | POA: Diagnosis not present

## 2016-09-11 DIAGNOSIS — I10 Essential (primary) hypertension: Secondary | ICD-10-CM | POA: Diagnosis not present

## 2016-09-11 DIAGNOSIS — M6281 Muscle weakness (generalized): Secondary | ICD-10-CM | POA: Diagnosis not present

## 2016-09-11 DIAGNOSIS — S46092A Other injury of muscle(s) and tendon(s) of the rotator cuff of left shoulder, initial encounter: Secondary | ICD-10-CM | POA: Diagnosis not present

## 2016-09-11 DIAGNOSIS — G20 Parkinson's disease: Secondary | ICD-10-CM | POA: Diagnosis not present

## 2016-09-11 DIAGNOSIS — M546 Pain in thoracic spine: Secondary | ICD-10-CM | POA: Diagnosis not present

## 2016-09-11 DIAGNOSIS — M4854XD Collapsed vertebra, not elsewhere classified, thoracic region, subsequent encounter for fracture with routine healing: Secondary | ICD-10-CM | POA: Diagnosis not present

## 2016-09-11 DIAGNOSIS — R1310 Dysphagia, unspecified: Secondary | ICD-10-CM | POA: Diagnosis not present

## 2016-09-11 DIAGNOSIS — R262 Difficulty in walking, not elsewhere classified: Secondary | ICD-10-CM | POA: Diagnosis not present

## 2016-09-11 DIAGNOSIS — K219 Gastro-esophageal reflux disease without esophagitis: Secondary | ICD-10-CM | POA: Diagnosis not present

## 2016-09-12 DIAGNOSIS — R1310 Dysphagia, unspecified: Secondary | ICD-10-CM | POA: Diagnosis not present

## 2016-09-12 DIAGNOSIS — R262 Difficulty in walking, not elsewhere classified: Secondary | ICD-10-CM | POA: Diagnosis not present

## 2016-09-12 DIAGNOSIS — M6281 Muscle weakness (generalized): Secondary | ICD-10-CM | POA: Diagnosis not present

## 2016-09-12 DIAGNOSIS — G20 Parkinson's disease: Secondary | ICD-10-CM | POA: Diagnosis not present

## 2016-09-12 DIAGNOSIS — K219 Gastro-esophageal reflux disease without esophagitis: Secondary | ICD-10-CM | POA: Diagnosis not present

## 2016-09-12 DIAGNOSIS — M4854XD Collapsed vertebra, not elsewhere classified, thoracic region, subsequent encounter for fracture with routine healing: Secondary | ICD-10-CM | POA: Diagnosis not present

## 2016-09-13 DIAGNOSIS — M6281 Muscle weakness (generalized): Secondary | ICD-10-CM | POA: Diagnosis not present

## 2016-09-13 DIAGNOSIS — R1310 Dysphagia, unspecified: Secondary | ICD-10-CM | POA: Diagnosis not present

## 2016-09-13 DIAGNOSIS — R262 Difficulty in walking, not elsewhere classified: Secondary | ICD-10-CM | POA: Diagnosis not present

## 2016-09-13 DIAGNOSIS — K219 Gastro-esophageal reflux disease without esophagitis: Secondary | ICD-10-CM | POA: Diagnosis not present

## 2016-09-13 DIAGNOSIS — M4854XD Collapsed vertebra, not elsewhere classified, thoracic region, subsequent encounter for fracture with routine healing: Secondary | ICD-10-CM | POA: Diagnosis not present

## 2016-09-13 DIAGNOSIS — G20 Parkinson's disease: Secondary | ICD-10-CM | POA: Diagnosis not present

## 2016-09-13 DIAGNOSIS — R41841 Cognitive communication deficit: Secondary | ICD-10-CM | POA: Diagnosis not present

## 2016-09-14 DIAGNOSIS — M4854XD Collapsed vertebra, not elsewhere classified, thoracic region, subsequent encounter for fracture with routine healing: Secondary | ICD-10-CM | POA: Diagnosis not present

## 2016-09-14 DIAGNOSIS — K219 Gastro-esophageal reflux disease without esophagitis: Secondary | ICD-10-CM | POA: Diagnosis not present

## 2016-09-14 DIAGNOSIS — M6281 Muscle weakness (generalized): Secondary | ICD-10-CM | POA: Diagnosis not present

## 2016-09-14 DIAGNOSIS — R1310 Dysphagia, unspecified: Secondary | ICD-10-CM | POA: Diagnosis not present

## 2016-09-14 DIAGNOSIS — R262 Difficulty in walking, not elsewhere classified: Secondary | ICD-10-CM | POA: Diagnosis not present

## 2016-09-14 DIAGNOSIS — G20 Parkinson's disease: Secondary | ICD-10-CM | POA: Diagnosis not present

## 2016-09-17 DIAGNOSIS — K219 Gastro-esophageal reflux disease without esophagitis: Secondary | ICD-10-CM | POA: Diagnosis not present

## 2016-09-17 DIAGNOSIS — R1310 Dysphagia, unspecified: Secondary | ICD-10-CM | POA: Diagnosis not present

## 2016-09-17 DIAGNOSIS — M4854XD Collapsed vertebra, not elsewhere classified, thoracic region, subsequent encounter for fracture with routine healing: Secondary | ICD-10-CM | POA: Diagnosis not present

## 2016-09-17 DIAGNOSIS — M6281 Muscle weakness (generalized): Secondary | ICD-10-CM | POA: Diagnosis not present

## 2016-09-17 DIAGNOSIS — G20 Parkinson's disease: Secondary | ICD-10-CM | POA: Diagnosis not present

## 2016-09-17 DIAGNOSIS — R262 Difficulty in walking, not elsewhere classified: Secondary | ICD-10-CM | POA: Diagnosis not present

## 2016-09-18 DIAGNOSIS — G20 Parkinson's disease: Secondary | ICD-10-CM | POA: Diagnosis not present

## 2016-09-18 DIAGNOSIS — K219 Gastro-esophageal reflux disease without esophagitis: Secondary | ICD-10-CM | POA: Diagnosis not present

## 2016-09-18 DIAGNOSIS — R262 Difficulty in walking, not elsewhere classified: Secondary | ICD-10-CM | POA: Diagnosis not present

## 2016-09-18 DIAGNOSIS — M6281 Muscle weakness (generalized): Secondary | ICD-10-CM | POA: Diagnosis not present

## 2016-09-18 DIAGNOSIS — M4854XD Collapsed vertebra, not elsewhere classified, thoracic region, subsequent encounter for fracture with routine healing: Secondary | ICD-10-CM | POA: Diagnosis not present

## 2016-09-18 DIAGNOSIS — R1310 Dysphagia, unspecified: Secondary | ICD-10-CM | POA: Diagnosis not present

## 2016-09-19 DIAGNOSIS — R262 Difficulty in walking, not elsewhere classified: Secondary | ICD-10-CM | POA: Diagnosis not present

## 2016-09-19 DIAGNOSIS — R1310 Dysphagia, unspecified: Secondary | ICD-10-CM | POA: Diagnosis not present

## 2016-09-19 DIAGNOSIS — M6281 Muscle weakness (generalized): Secondary | ICD-10-CM | POA: Diagnosis not present

## 2016-09-19 DIAGNOSIS — M4854XD Collapsed vertebra, not elsewhere classified, thoracic region, subsequent encounter for fracture with routine healing: Secondary | ICD-10-CM | POA: Diagnosis not present

## 2016-09-19 DIAGNOSIS — G20 Parkinson's disease: Secondary | ICD-10-CM | POA: Diagnosis not present

## 2016-09-19 DIAGNOSIS — K219 Gastro-esophageal reflux disease without esophagitis: Secondary | ICD-10-CM | POA: Diagnosis not present

## 2016-09-20 DIAGNOSIS — M6281 Muscle weakness (generalized): Secondary | ICD-10-CM | POA: Diagnosis not present

## 2016-09-20 DIAGNOSIS — M4854XD Collapsed vertebra, not elsewhere classified, thoracic region, subsequent encounter for fracture with routine healing: Secondary | ICD-10-CM | POA: Diagnosis not present

## 2016-09-20 DIAGNOSIS — R1310 Dysphagia, unspecified: Secondary | ICD-10-CM | POA: Diagnosis not present

## 2016-09-20 DIAGNOSIS — R262 Difficulty in walking, not elsewhere classified: Secondary | ICD-10-CM | POA: Diagnosis not present

## 2016-09-20 DIAGNOSIS — G20 Parkinson's disease: Secondary | ICD-10-CM | POA: Diagnosis not present

## 2016-09-20 DIAGNOSIS — K219 Gastro-esophageal reflux disease without esophagitis: Secondary | ICD-10-CM | POA: Diagnosis not present

## 2016-09-21 DIAGNOSIS — M4854XD Collapsed vertebra, not elsewhere classified, thoracic region, subsequent encounter for fracture with routine healing: Secondary | ICD-10-CM | POA: Diagnosis not present

## 2016-09-21 DIAGNOSIS — R262 Difficulty in walking, not elsewhere classified: Secondary | ICD-10-CM | POA: Diagnosis not present

## 2016-09-21 DIAGNOSIS — K219 Gastro-esophageal reflux disease without esophagitis: Secondary | ICD-10-CM | POA: Diagnosis not present

## 2016-09-21 DIAGNOSIS — G20 Parkinson's disease: Secondary | ICD-10-CM | POA: Diagnosis not present

## 2016-09-21 DIAGNOSIS — M6281 Muscle weakness (generalized): Secondary | ICD-10-CM | POA: Diagnosis not present

## 2016-09-21 DIAGNOSIS — R1310 Dysphagia, unspecified: Secondary | ICD-10-CM | POA: Diagnosis not present

## 2016-09-22 DIAGNOSIS — M6281 Muscle weakness (generalized): Secondary | ICD-10-CM | POA: Diagnosis not present

## 2016-09-22 DIAGNOSIS — R1310 Dysphagia, unspecified: Secondary | ICD-10-CM | POA: Diagnosis not present

## 2016-09-22 DIAGNOSIS — G20 Parkinson's disease: Secondary | ICD-10-CM | POA: Diagnosis not present

## 2016-09-22 DIAGNOSIS — R262 Difficulty in walking, not elsewhere classified: Secondary | ICD-10-CM | POA: Diagnosis not present

## 2016-09-22 DIAGNOSIS — M4854XD Collapsed vertebra, not elsewhere classified, thoracic region, subsequent encounter for fracture with routine healing: Secondary | ICD-10-CM | POA: Diagnosis not present

## 2016-09-22 DIAGNOSIS — K219 Gastro-esophageal reflux disease without esophagitis: Secondary | ICD-10-CM | POA: Diagnosis not present

## 2016-09-24 DIAGNOSIS — G20 Parkinson's disease: Secondary | ICD-10-CM | POA: Diagnosis not present

## 2016-09-24 DIAGNOSIS — M4854XD Collapsed vertebra, not elsewhere classified, thoracic region, subsequent encounter for fracture with routine healing: Secondary | ICD-10-CM | POA: Diagnosis not present

## 2016-09-24 DIAGNOSIS — M6281 Muscle weakness (generalized): Secondary | ICD-10-CM | POA: Diagnosis not present

## 2016-09-24 DIAGNOSIS — R262 Difficulty in walking, not elsewhere classified: Secondary | ICD-10-CM | POA: Diagnosis not present

## 2016-09-24 DIAGNOSIS — R1310 Dysphagia, unspecified: Secondary | ICD-10-CM | POA: Diagnosis not present

## 2016-09-24 DIAGNOSIS — K219 Gastro-esophageal reflux disease without esophagitis: Secondary | ICD-10-CM | POA: Diagnosis not present

## 2016-09-25 DIAGNOSIS — M6281 Muscle weakness (generalized): Secondary | ICD-10-CM | POA: Diagnosis not present

## 2016-09-25 DIAGNOSIS — G20 Parkinson's disease: Secondary | ICD-10-CM | POA: Diagnosis not present

## 2016-09-25 DIAGNOSIS — K219 Gastro-esophageal reflux disease without esophagitis: Secondary | ICD-10-CM | POA: Diagnosis not present

## 2016-09-25 DIAGNOSIS — R1310 Dysphagia, unspecified: Secondary | ICD-10-CM | POA: Diagnosis not present

## 2016-09-25 DIAGNOSIS — M4854XD Collapsed vertebra, not elsewhere classified, thoracic region, subsequent encounter for fracture with routine healing: Secondary | ICD-10-CM | POA: Diagnosis not present

## 2016-09-25 DIAGNOSIS — R262 Difficulty in walking, not elsewhere classified: Secondary | ICD-10-CM | POA: Diagnosis not present

## 2016-09-26 DIAGNOSIS — M6281 Muscle weakness (generalized): Secondary | ICD-10-CM | POA: Diagnosis not present

## 2016-09-26 DIAGNOSIS — G20 Parkinson's disease: Secondary | ICD-10-CM | POA: Diagnosis not present

## 2016-09-26 DIAGNOSIS — R262 Difficulty in walking, not elsewhere classified: Secondary | ICD-10-CM | POA: Diagnosis not present

## 2016-09-26 DIAGNOSIS — R1310 Dysphagia, unspecified: Secondary | ICD-10-CM | POA: Diagnosis not present

## 2016-09-26 DIAGNOSIS — K219 Gastro-esophageal reflux disease without esophagitis: Secondary | ICD-10-CM | POA: Diagnosis not present

## 2016-09-26 DIAGNOSIS — M4854XD Collapsed vertebra, not elsewhere classified, thoracic region, subsequent encounter for fracture with routine healing: Secondary | ICD-10-CM | POA: Diagnosis not present

## 2016-09-27 DIAGNOSIS — K219 Gastro-esophageal reflux disease without esophagitis: Secondary | ICD-10-CM | POA: Diagnosis not present

## 2016-09-27 DIAGNOSIS — R1310 Dysphagia, unspecified: Secondary | ICD-10-CM | POA: Diagnosis not present

## 2016-09-27 DIAGNOSIS — R262 Difficulty in walking, not elsewhere classified: Secondary | ICD-10-CM | POA: Diagnosis not present

## 2016-09-27 DIAGNOSIS — G20 Parkinson's disease: Secondary | ICD-10-CM | POA: Diagnosis not present

## 2016-09-27 DIAGNOSIS — M4854XD Collapsed vertebra, not elsewhere classified, thoracic region, subsequent encounter for fracture with routine healing: Secondary | ICD-10-CM | POA: Diagnosis not present

## 2016-09-27 DIAGNOSIS — M6281 Muscle weakness (generalized): Secondary | ICD-10-CM | POA: Diagnosis not present

## 2016-09-28 DIAGNOSIS — K219 Gastro-esophageal reflux disease without esophagitis: Secondary | ICD-10-CM | POA: Diagnosis not present

## 2016-09-28 DIAGNOSIS — M4854XD Collapsed vertebra, not elsewhere classified, thoracic region, subsequent encounter for fracture with routine healing: Secondary | ICD-10-CM | POA: Diagnosis not present

## 2016-09-28 DIAGNOSIS — R262 Difficulty in walking, not elsewhere classified: Secondary | ICD-10-CM | POA: Diagnosis not present

## 2016-09-28 DIAGNOSIS — M6281 Muscle weakness (generalized): Secondary | ICD-10-CM | POA: Diagnosis not present

## 2016-09-28 DIAGNOSIS — G20 Parkinson's disease: Secondary | ICD-10-CM | POA: Diagnosis not present

## 2016-09-28 DIAGNOSIS — R1310 Dysphagia, unspecified: Secondary | ICD-10-CM | POA: Diagnosis not present

## 2016-10-01 DIAGNOSIS — K219 Gastro-esophageal reflux disease without esophagitis: Secondary | ICD-10-CM | POA: Diagnosis not present

## 2016-10-01 DIAGNOSIS — R262 Difficulty in walking, not elsewhere classified: Secondary | ICD-10-CM | POA: Diagnosis not present

## 2016-10-01 DIAGNOSIS — M6281 Muscle weakness (generalized): Secondary | ICD-10-CM | POA: Diagnosis not present

## 2016-10-01 DIAGNOSIS — R1310 Dysphagia, unspecified: Secondary | ICD-10-CM | POA: Diagnosis not present

## 2016-10-01 DIAGNOSIS — M4854XD Collapsed vertebra, not elsewhere classified, thoracic region, subsequent encounter for fracture with routine healing: Secondary | ICD-10-CM | POA: Diagnosis not present

## 2016-10-01 DIAGNOSIS — G20 Parkinson's disease: Secondary | ICD-10-CM | POA: Diagnosis not present

## 2016-10-02 DIAGNOSIS — K219 Gastro-esophageal reflux disease without esophagitis: Secondary | ICD-10-CM | POA: Diagnosis not present

## 2016-10-02 DIAGNOSIS — M6281 Muscle weakness (generalized): Secondary | ICD-10-CM | POA: Diagnosis not present

## 2016-10-02 DIAGNOSIS — R262 Difficulty in walking, not elsewhere classified: Secondary | ICD-10-CM | POA: Diagnosis not present

## 2016-10-02 DIAGNOSIS — R1310 Dysphagia, unspecified: Secondary | ICD-10-CM | POA: Diagnosis not present

## 2016-10-02 DIAGNOSIS — M4854XD Collapsed vertebra, not elsewhere classified, thoracic region, subsequent encounter for fracture with routine healing: Secondary | ICD-10-CM | POA: Diagnosis not present

## 2016-10-02 DIAGNOSIS — G20 Parkinson's disease: Secondary | ICD-10-CM | POA: Diagnosis not present

## 2016-10-03 DIAGNOSIS — K219 Gastro-esophageal reflux disease without esophagitis: Secondary | ICD-10-CM | POA: Diagnosis not present

## 2016-10-03 DIAGNOSIS — M4854XD Collapsed vertebra, not elsewhere classified, thoracic region, subsequent encounter for fracture with routine healing: Secondary | ICD-10-CM | POA: Diagnosis not present

## 2016-10-03 DIAGNOSIS — R262 Difficulty in walking, not elsewhere classified: Secondary | ICD-10-CM | POA: Diagnosis not present

## 2016-10-03 DIAGNOSIS — M6281 Muscle weakness (generalized): Secondary | ICD-10-CM | POA: Diagnosis not present

## 2016-10-03 DIAGNOSIS — R1310 Dysphagia, unspecified: Secondary | ICD-10-CM | POA: Diagnosis not present

## 2016-10-03 DIAGNOSIS — G20 Parkinson's disease: Secondary | ICD-10-CM | POA: Diagnosis not present

## 2016-10-04 DIAGNOSIS — K219 Gastro-esophageal reflux disease without esophagitis: Secondary | ICD-10-CM | POA: Diagnosis not present

## 2016-10-04 DIAGNOSIS — R1310 Dysphagia, unspecified: Secondary | ICD-10-CM | POA: Diagnosis not present

## 2016-10-04 DIAGNOSIS — M4854XD Collapsed vertebra, not elsewhere classified, thoracic region, subsequent encounter for fracture with routine healing: Secondary | ICD-10-CM | POA: Diagnosis not present

## 2016-10-04 DIAGNOSIS — M6281 Muscle weakness (generalized): Secondary | ICD-10-CM | POA: Diagnosis not present

## 2016-10-04 DIAGNOSIS — G20 Parkinson's disease: Secondary | ICD-10-CM | POA: Diagnosis not present

## 2016-10-04 DIAGNOSIS — R262 Difficulty in walking, not elsewhere classified: Secondary | ICD-10-CM | POA: Diagnosis not present

## 2016-10-05 DIAGNOSIS — K219 Gastro-esophageal reflux disease without esophagitis: Secondary | ICD-10-CM | POA: Diagnosis not present

## 2016-10-05 DIAGNOSIS — G20 Parkinson's disease: Secondary | ICD-10-CM | POA: Diagnosis not present

## 2016-10-05 DIAGNOSIS — R1310 Dysphagia, unspecified: Secondary | ICD-10-CM | POA: Diagnosis not present

## 2016-10-05 DIAGNOSIS — M6281 Muscle weakness (generalized): Secondary | ICD-10-CM | POA: Diagnosis not present

## 2016-10-05 DIAGNOSIS — R262 Difficulty in walking, not elsewhere classified: Secondary | ICD-10-CM | POA: Diagnosis not present

## 2016-10-05 DIAGNOSIS — M4854XD Collapsed vertebra, not elsewhere classified, thoracic region, subsequent encounter for fracture with routine healing: Secondary | ICD-10-CM | POA: Diagnosis not present

## 2016-10-08 DIAGNOSIS — R262 Difficulty in walking, not elsewhere classified: Secondary | ICD-10-CM | POA: Diagnosis not present

## 2016-10-08 DIAGNOSIS — R1310 Dysphagia, unspecified: Secondary | ICD-10-CM | POA: Diagnosis not present

## 2016-10-08 DIAGNOSIS — G20 Parkinson's disease: Secondary | ICD-10-CM | POA: Diagnosis not present

## 2016-10-08 DIAGNOSIS — K219 Gastro-esophageal reflux disease without esophagitis: Secondary | ICD-10-CM | POA: Diagnosis not present

## 2016-10-08 DIAGNOSIS — M4854XD Collapsed vertebra, not elsewhere classified, thoracic region, subsequent encounter for fracture with routine healing: Secondary | ICD-10-CM | POA: Diagnosis not present

## 2016-10-08 DIAGNOSIS — M6281 Muscle weakness (generalized): Secondary | ICD-10-CM | POA: Diagnosis not present

## 2016-10-09 DIAGNOSIS — R1310 Dysphagia, unspecified: Secondary | ICD-10-CM | POA: Diagnosis not present

## 2016-10-09 DIAGNOSIS — M6281 Muscle weakness (generalized): Secondary | ICD-10-CM | POA: Diagnosis not present

## 2016-10-09 DIAGNOSIS — G20 Parkinson's disease: Secondary | ICD-10-CM | POA: Diagnosis not present

## 2016-10-09 DIAGNOSIS — R262 Difficulty in walking, not elsewhere classified: Secondary | ICD-10-CM | POA: Diagnosis not present

## 2016-10-09 DIAGNOSIS — I10 Essential (primary) hypertension: Secondary | ICD-10-CM | POA: Diagnosis not present

## 2016-10-09 DIAGNOSIS — M4854XD Collapsed vertebra, not elsewhere classified, thoracic region, subsequent encounter for fracture with routine healing: Secondary | ICD-10-CM | POA: Diagnosis not present

## 2016-10-09 DIAGNOSIS — K219 Gastro-esophageal reflux disease without esophagitis: Secondary | ICD-10-CM | POA: Diagnosis not present

## 2016-10-10 DIAGNOSIS — R262 Difficulty in walking, not elsewhere classified: Secondary | ICD-10-CM | POA: Diagnosis not present

## 2016-10-10 DIAGNOSIS — M6281 Muscle weakness (generalized): Secondary | ICD-10-CM | POA: Diagnosis not present

## 2016-10-10 DIAGNOSIS — G20 Parkinson's disease: Secondary | ICD-10-CM | POA: Diagnosis not present

## 2016-10-10 DIAGNOSIS — K219 Gastro-esophageal reflux disease without esophagitis: Secondary | ICD-10-CM | POA: Diagnosis not present

## 2016-10-10 DIAGNOSIS — R1310 Dysphagia, unspecified: Secondary | ICD-10-CM | POA: Diagnosis not present

## 2016-10-10 DIAGNOSIS — M4854XD Collapsed vertebra, not elsewhere classified, thoracic region, subsequent encounter for fracture with routine healing: Secondary | ICD-10-CM | POA: Diagnosis not present

## 2016-10-11 DIAGNOSIS — R262 Difficulty in walking, not elsewhere classified: Secondary | ICD-10-CM | POA: Diagnosis not present

## 2016-10-11 DIAGNOSIS — R1310 Dysphagia, unspecified: Secondary | ICD-10-CM | POA: Diagnosis not present

## 2016-10-11 DIAGNOSIS — R251 Tremor, unspecified: Secondary | ICD-10-CM | POA: Diagnosis not present

## 2016-10-11 DIAGNOSIS — M4854XD Collapsed vertebra, not elsewhere classified, thoracic region, subsequent encounter for fracture with routine healing: Secondary | ICD-10-CM | POA: Diagnosis not present

## 2016-10-11 DIAGNOSIS — K219 Gastro-esophageal reflux disease without esophagitis: Secondary | ICD-10-CM | POA: Diagnosis not present

## 2016-10-11 DIAGNOSIS — G20 Parkinson's disease: Secondary | ICD-10-CM | POA: Diagnosis not present

## 2016-10-11 DIAGNOSIS — M6281 Muscle weakness (generalized): Secondary | ICD-10-CM | POA: Diagnosis not present

## 2016-10-12 DIAGNOSIS — G20 Parkinson's disease: Secondary | ICD-10-CM | POA: Diagnosis not present

## 2016-10-12 DIAGNOSIS — K219 Gastro-esophageal reflux disease without esophagitis: Secondary | ICD-10-CM | POA: Diagnosis not present

## 2016-10-12 DIAGNOSIS — R1310 Dysphagia, unspecified: Secondary | ICD-10-CM | POA: Diagnosis not present

## 2016-10-12 DIAGNOSIS — M6281 Muscle weakness (generalized): Secondary | ICD-10-CM | POA: Diagnosis not present

## 2016-10-12 DIAGNOSIS — M4854XD Collapsed vertebra, not elsewhere classified, thoracic region, subsequent encounter for fracture with routine healing: Secondary | ICD-10-CM | POA: Diagnosis not present

## 2016-10-12 DIAGNOSIS — R262 Difficulty in walking, not elsewhere classified: Secondary | ICD-10-CM | POA: Diagnosis not present

## 2016-10-15 DIAGNOSIS — M6281 Muscle weakness (generalized): Secondary | ICD-10-CM | POA: Diagnosis not present

## 2016-10-15 DIAGNOSIS — G20 Parkinson's disease: Secondary | ICD-10-CM | POA: Diagnosis not present

## 2016-10-15 DIAGNOSIS — R262 Difficulty in walking, not elsewhere classified: Secondary | ICD-10-CM | POA: Diagnosis not present

## 2016-10-15 DIAGNOSIS — M4854XD Collapsed vertebra, not elsewhere classified, thoracic region, subsequent encounter for fracture with routine healing: Secondary | ICD-10-CM | POA: Diagnosis not present

## 2016-10-16 DIAGNOSIS — M4854XD Collapsed vertebra, not elsewhere classified, thoracic region, subsequent encounter for fracture with routine healing: Secondary | ICD-10-CM | POA: Diagnosis not present

## 2016-10-16 DIAGNOSIS — R262 Difficulty in walking, not elsewhere classified: Secondary | ICD-10-CM | POA: Diagnosis not present

## 2016-10-16 DIAGNOSIS — M6281 Muscle weakness (generalized): Secondary | ICD-10-CM | POA: Diagnosis not present

## 2016-10-16 DIAGNOSIS — G20 Parkinson's disease: Secondary | ICD-10-CM | POA: Diagnosis not present

## 2016-10-17 DIAGNOSIS — M6281 Muscle weakness (generalized): Secondary | ICD-10-CM | POA: Diagnosis not present

## 2016-10-17 DIAGNOSIS — G20 Parkinson's disease: Secondary | ICD-10-CM | POA: Diagnosis not present

## 2016-10-17 DIAGNOSIS — M4854XD Collapsed vertebra, not elsewhere classified, thoracic region, subsequent encounter for fracture with routine healing: Secondary | ICD-10-CM | POA: Diagnosis not present

## 2016-10-17 DIAGNOSIS — R262 Difficulty in walking, not elsewhere classified: Secondary | ICD-10-CM | POA: Diagnosis not present

## 2016-10-18 DIAGNOSIS — M6281 Muscle weakness (generalized): Secondary | ICD-10-CM | POA: Diagnosis not present

## 2016-10-18 DIAGNOSIS — R262 Difficulty in walking, not elsewhere classified: Secondary | ICD-10-CM | POA: Diagnosis not present

## 2016-10-18 DIAGNOSIS — M4854XD Collapsed vertebra, not elsewhere classified, thoracic region, subsequent encounter for fracture with routine healing: Secondary | ICD-10-CM | POA: Diagnosis not present

## 2016-10-18 DIAGNOSIS — G20 Parkinson's disease: Secondary | ICD-10-CM | POA: Diagnosis not present

## 2016-10-19 DIAGNOSIS — M6281 Muscle weakness (generalized): Secondary | ICD-10-CM | POA: Diagnosis not present

## 2016-10-19 DIAGNOSIS — R262 Difficulty in walking, not elsewhere classified: Secondary | ICD-10-CM | POA: Diagnosis not present

## 2016-10-19 DIAGNOSIS — M4854XD Collapsed vertebra, not elsewhere classified, thoracic region, subsequent encounter for fracture with routine healing: Secondary | ICD-10-CM | POA: Diagnosis not present

## 2016-10-19 DIAGNOSIS — G20 Parkinson's disease: Secondary | ICD-10-CM | POA: Diagnosis not present

## 2016-10-22 DIAGNOSIS — M4854XD Collapsed vertebra, not elsewhere classified, thoracic region, subsequent encounter for fracture with routine healing: Secondary | ICD-10-CM | POA: Diagnosis not present

## 2016-10-22 DIAGNOSIS — G20 Parkinson's disease: Secondary | ICD-10-CM | POA: Diagnosis not present

## 2016-10-22 DIAGNOSIS — R262 Difficulty in walking, not elsewhere classified: Secondary | ICD-10-CM | POA: Diagnosis not present

## 2016-10-22 DIAGNOSIS — M6281 Muscle weakness (generalized): Secondary | ICD-10-CM | POA: Diagnosis not present

## 2016-10-23 DIAGNOSIS — G20 Parkinson's disease: Secondary | ICD-10-CM | POA: Diagnosis not present

## 2016-10-23 DIAGNOSIS — M6281 Muscle weakness (generalized): Secondary | ICD-10-CM | POA: Diagnosis not present

## 2016-10-23 DIAGNOSIS — M4854XD Collapsed vertebra, not elsewhere classified, thoracic region, subsequent encounter for fracture with routine healing: Secondary | ICD-10-CM | POA: Diagnosis not present

## 2016-10-23 DIAGNOSIS — R262 Difficulty in walking, not elsewhere classified: Secondary | ICD-10-CM | POA: Diagnosis not present

## 2016-10-24 DIAGNOSIS — M4854XD Collapsed vertebra, not elsewhere classified, thoracic region, subsequent encounter for fracture with routine healing: Secondary | ICD-10-CM | POA: Diagnosis not present

## 2016-10-24 DIAGNOSIS — R262 Difficulty in walking, not elsewhere classified: Secondary | ICD-10-CM | POA: Diagnosis not present

## 2016-10-24 DIAGNOSIS — G20 Parkinson's disease: Secondary | ICD-10-CM | POA: Diagnosis not present

## 2016-10-24 DIAGNOSIS — M6281 Muscle weakness (generalized): Secondary | ICD-10-CM | POA: Diagnosis not present

## 2016-10-25 DIAGNOSIS — M6281 Muscle weakness (generalized): Secondary | ICD-10-CM | POA: Diagnosis not present

## 2016-10-25 DIAGNOSIS — R262 Difficulty in walking, not elsewhere classified: Secondary | ICD-10-CM | POA: Diagnosis not present

## 2016-10-25 DIAGNOSIS — M4854XD Collapsed vertebra, not elsewhere classified, thoracic region, subsequent encounter for fracture with routine healing: Secondary | ICD-10-CM | POA: Diagnosis not present

## 2016-10-25 DIAGNOSIS — G20 Parkinson's disease: Secondary | ICD-10-CM | POA: Diagnosis not present

## 2016-10-26 DIAGNOSIS — M4854XD Collapsed vertebra, not elsewhere classified, thoracic region, subsequent encounter for fracture with routine healing: Secondary | ICD-10-CM | POA: Diagnosis not present

## 2016-10-26 DIAGNOSIS — M6281 Muscle weakness (generalized): Secondary | ICD-10-CM | POA: Diagnosis not present

## 2016-10-26 DIAGNOSIS — G20 Parkinson's disease: Secondary | ICD-10-CM | POA: Diagnosis not present

## 2016-10-26 DIAGNOSIS — R262 Difficulty in walking, not elsewhere classified: Secondary | ICD-10-CM | POA: Diagnosis not present

## 2016-10-29 DIAGNOSIS — R262 Difficulty in walking, not elsewhere classified: Secondary | ICD-10-CM | POA: Diagnosis not present

## 2016-10-29 DIAGNOSIS — G20 Parkinson's disease: Secondary | ICD-10-CM | POA: Diagnosis not present

## 2016-10-29 DIAGNOSIS — M4854XD Collapsed vertebra, not elsewhere classified, thoracic region, subsequent encounter for fracture with routine healing: Secondary | ICD-10-CM | POA: Diagnosis not present

## 2016-10-29 DIAGNOSIS — M6281 Muscle weakness (generalized): Secondary | ICD-10-CM | POA: Diagnosis not present

## 2016-10-30 DIAGNOSIS — G20 Parkinson's disease: Secondary | ICD-10-CM | POA: Diagnosis not present

## 2016-10-30 DIAGNOSIS — M4854XD Collapsed vertebra, not elsewhere classified, thoracic region, subsequent encounter for fracture with routine healing: Secondary | ICD-10-CM | POA: Diagnosis not present

## 2016-10-30 DIAGNOSIS — R262 Difficulty in walking, not elsewhere classified: Secondary | ICD-10-CM | POA: Diagnosis not present

## 2016-10-30 DIAGNOSIS — M6281 Muscle weakness (generalized): Secondary | ICD-10-CM | POA: Diagnosis not present

## 2016-10-31 DIAGNOSIS — R262 Difficulty in walking, not elsewhere classified: Secondary | ICD-10-CM | POA: Diagnosis not present

## 2016-10-31 DIAGNOSIS — G20 Parkinson's disease: Secondary | ICD-10-CM | POA: Diagnosis not present

## 2016-10-31 DIAGNOSIS — M4854XD Collapsed vertebra, not elsewhere classified, thoracic region, subsequent encounter for fracture with routine healing: Secondary | ICD-10-CM | POA: Diagnosis not present

## 2016-10-31 DIAGNOSIS — M6281 Muscle weakness (generalized): Secondary | ICD-10-CM | POA: Diagnosis not present

## 2016-11-01 DIAGNOSIS — M6281 Muscle weakness (generalized): Secondary | ICD-10-CM | POA: Diagnosis not present

## 2016-11-01 DIAGNOSIS — M4854XD Collapsed vertebra, not elsewhere classified, thoracic region, subsequent encounter for fracture with routine healing: Secondary | ICD-10-CM | POA: Diagnosis not present

## 2016-11-01 DIAGNOSIS — G20 Parkinson's disease: Secondary | ICD-10-CM | POA: Diagnosis not present

## 2016-11-01 DIAGNOSIS — R262 Difficulty in walking, not elsewhere classified: Secondary | ICD-10-CM | POA: Diagnosis not present

## 2016-11-06 DIAGNOSIS — K219 Gastro-esophageal reflux disease without esophagitis: Secondary | ICD-10-CM | POA: Diagnosis not present

## 2016-11-06 DIAGNOSIS — I10 Essential (primary) hypertension: Secondary | ICD-10-CM | POA: Diagnosis not present

## 2016-11-06 DIAGNOSIS — M4854XD Collapsed vertebra, not elsewhere classified, thoracic region, subsequent encounter for fracture with routine healing: Secondary | ICD-10-CM | POA: Diagnosis not present

## 2016-11-06 DIAGNOSIS — G20 Parkinson's disease: Secondary | ICD-10-CM | POA: Diagnosis not present

## 2016-12-05 DIAGNOSIS — R5383 Other fatigue: Secondary | ICD-10-CM | POA: Diagnosis not present

## 2016-12-05 DIAGNOSIS — H6120 Impacted cerumen, unspecified ear: Secondary | ICD-10-CM | POA: Insufficient documentation

## 2016-12-05 DIAGNOSIS — M546 Pain in thoracic spine: Secondary | ICD-10-CM | POA: Diagnosis not present

## 2016-12-05 DIAGNOSIS — K921 Melena: Secondary | ICD-10-CM | POA: Diagnosis not present

## 2016-12-05 DIAGNOSIS — R399 Unspecified symptoms and signs involving the genitourinary system: Secondary | ICD-10-CM | POA: Insufficient documentation

## 2016-12-05 DIAGNOSIS — G8929 Other chronic pain: Secondary | ICD-10-CM | POA: Diagnosis not present

## 2016-12-07 DIAGNOSIS — R2689 Other abnormalities of gait and mobility: Secondary | ICD-10-CM | POA: Diagnosis not present

## 2016-12-10 DIAGNOSIS — R2689 Other abnormalities of gait and mobility: Secondary | ICD-10-CM | POA: Diagnosis not present

## 2016-12-13 DIAGNOSIS — R2689 Other abnormalities of gait and mobility: Secondary | ICD-10-CM | POA: Diagnosis not present

## 2016-12-17 DIAGNOSIS — R2689 Other abnormalities of gait and mobility: Secondary | ICD-10-CM | POA: Diagnosis not present

## 2016-12-18 DIAGNOSIS — R2689 Other abnormalities of gait and mobility: Secondary | ICD-10-CM | POA: Diagnosis not present

## 2016-12-21 DIAGNOSIS — R2689 Other abnormalities of gait and mobility: Secondary | ICD-10-CM | POA: Diagnosis not present

## 2016-12-24 DIAGNOSIS — R2689 Other abnormalities of gait and mobility: Secondary | ICD-10-CM | POA: Diagnosis not present

## 2016-12-25 DIAGNOSIS — R2689 Other abnormalities of gait and mobility: Secondary | ICD-10-CM | POA: Diagnosis not present

## 2016-12-27 DIAGNOSIS — R2689 Other abnormalities of gait and mobility: Secondary | ICD-10-CM | POA: Diagnosis not present

## 2017-01-01 DIAGNOSIS — R2689 Other abnormalities of gait and mobility: Secondary | ICD-10-CM | POA: Diagnosis not present

## 2017-01-02 DIAGNOSIS — R2689 Other abnormalities of gait and mobility: Secondary | ICD-10-CM | POA: Diagnosis not present

## 2017-01-04 DIAGNOSIS — R2689 Other abnormalities of gait and mobility: Secondary | ICD-10-CM | POA: Diagnosis not present

## 2017-01-08 DIAGNOSIS — R2689 Other abnormalities of gait and mobility: Secondary | ICD-10-CM | POA: Diagnosis not present

## 2017-01-10 DIAGNOSIS — R2689 Other abnormalities of gait and mobility: Secondary | ICD-10-CM | POA: Diagnosis not present

## 2017-01-14 DIAGNOSIS — G20 Parkinson's disease: Secondary | ICD-10-CM | POA: Diagnosis not present

## 2017-01-14 DIAGNOSIS — R2689 Other abnormalities of gait and mobility: Secondary | ICD-10-CM | POA: Diagnosis not present

## 2017-01-15 DIAGNOSIS — R2689 Other abnormalities of gait and mobility: Secondary | ICD-10-CM | POA: Diagnosis not present

## 2017-01-15 DIAGNOSIS — G20 Parkinson's disease: Secondary | ICD-10-CM | POA: Diagnosis not present

## 2017-01-17 DIAGNOSIS — G20 Parkinson's disease: Secondary | ICD-10-CM | POA: Diagnosis not present

## 2017-01-17 DIAGNOSIS — R2689 Other abnormalities of gait and mobility: Secondary | ICD-10-CM | POA: Diagnosis not present

## 2017-01-21 DIAGNOSIS — R2689 Other abnormalities of gait and mobility: Secondary | ICD-10-CM | POA: Diagnosis not present

## 2017-01-21 DIAGNOSIS — G20 Parkinson's disease: Secondary | ICD-10-CM | POA: Diagnosis not present

## 2017-01-22 DIAGNOSIS — R2689 Other abnormalities of gait and mobility: Secondary | ICD-10-CM | POA: Diagnosis not present

## 2017-01-22 DIAGNOSIS — G20 Parkinson's disease: Secondary | ICD-10-CM | POA: Diagnosis not present

## 2017-01-24 DIAGNOSIS — R2689 Other abnormalities of gait and mobility: Secondary | ICD-10-CM | POA: Diagnosis not present

## 2017-01-24 DIAGNOSIS — G20 Parkinson's disease: Secondary | ICD-10-CM | POA: Diagnosis not present

## 2017-01-28 DIAGNOSIS — M25512 Pain in left shoulder: Secondary | ICD-10-CM | POA: Diagnosis not present

## 2017-01-28 DIAGNOSIS — R600 Localized edema: Secondary | ICD-10-CM | POA: Diagnosis not present

## 2017-01-28 DIAGNOSIS — M25612 Stiffness of left shoulder, not elsewhere classified: Secondary | ICD-10-CM | POA: Diagnosis not present

## 2017-01-29 DIAGNOSIS — S42035A Nondisplaced fracture of lateral end of left clavicle, initial encounter for closed fracture: Secondary | ICD-10-CM | POA: Diagnosis not present

## 2017-02-05 DIAGNOSIS — R2689 Other abnormalities of gait and mobility: Secondary | ICD-10-CM | POA: Diagnosis not present

## 2017-02-05 DIAGNOSIS — G20 Parkinson's disease: Secondary | ICD-10-CM | POA: Diagnosis not present

## 2017-02-07 DIAGNOSIS — G20 Parkinson's disease: Secondary | ICD-10-CM | POA: Diagnosis not present

## 2017-02-07 DIAGNOSIS — R2689 Other abnormalities of gait and mobility: Secondary | ICD-10-CM | POA: Diagnosis not present

## 2017-02-20 DIAGNOSIS — S42035A Nondisplaced fracture of lateral end of left clavicle, initial encounter for closed fracture: Secondary | ICD-10-CM | POA: Diagnosis not present

## 2017-02-26 DIAGNOSIS — M6281 Muscle weakness (generalized): Secondary | ICD-10-CM | POA: Diagnosis not present

## 2017-02-28 DIAGNOSIS — M6281 Muscle weakness (generalized): Secondary | ICD-10-CM | POA: Diagnosis not present

## 2017-03-05 DIAGNOSIS — G20 Parkinson's disease: Secondary | ICD-10-CM | POA: Diagnosis not present

## 2017-03-05 DIAGNOSIS — R251 Tremor, unspecified: Secondary | ICD-10-CM | POA: Diagnosis not present

## 2017-03-05 DIAGNOSIS — R262 Difficulty in walking, not elsewhere classified: Secondary | ICD-10-CM | POA: Diagnosis not present

## 2017-03-05 DIAGNOSIS — M6281 Muscle weakness (generalized): Secondary | ICD-10-CM | POA: Diagnosis not present

## 2017-03-06 DIAGNOSIS — R262 Difficulty in walking, not elsewhere classified: Secondary | ICD-10-CM | POA: Insufficient documentation

## 2017-03-07 DIAGNOSIS — M6281 Muscle weakness (generalized): Secondary | ICD-10-CM | POA: Diagnosis not present

## 2017-03-11 DIAGNOSIS — M6281 Muscle weakness (generalized): Secondary | ICD-10-CM | POA: Diagnosis not present

## 2017-03-13 DIAGNOSIS — M6281 Muscle weakness (generalized): Secondary | ICD-10-CM | POA: Diagnosis not present

## 2017-03-14 ENCOUNTER — Non-Acute Institutional Stay: Payer: Medicare Other | Admitting: Internal Medicine

## 2017-03-14 ENCOUNTER — Encounter: Payer: Self-pay | Admitting: Internal Medicine

## 2017-03-14 VITALS — BP 122/82 | HR 84 | Temp 97.6°F | Resp 16 | Wt 110.6 lb

## 2017-03-14 DIAGNOSIS — K219 Gastro-esophageal reflux disease without esophagitis: Secondary | ICD-10-CM

## 2017-03-14 DIAGNOSIS — E441 Mild protein-calorie malnutrition: Secondary | ICD-10-CM

## 2017-03-14 DIAGNOSIS — Z7189 Other specified counseling: Secondary | ICD-10-CM | POA: Diagnosis not present

## 2017-03-14 DIAGNOSIS — H409 Unspecified glaucoma: Secondary | ICD-10-CM | POA: Insufficient documentation

## 2017-03-14 DIAGNOSIS — S42035A Nondisplaced fracture of lateral end of left clavicle, initial encounter for closed fracture: Secondary | ICD-10-CM | POA: Diagnosis not present

## 2017-03-14 DIAGNOSIS — G20 Parkinson's disease: Secondary | ICD-10-CM | POA: Diagnosis not present

## 2017-03-14 DIAGNOSIS — I10 Essential (primary) hypertension: Secondary | ICD-10-CM | POA: Insufficient documentation

## 2017-03-14 DIAGNOSIS — M159 Polyosteoarthritis, unspecified: Secondary | ICD-10-CM

## 2017-03-14 MED ORDER — VITAMIN D (ERGOCALCIFEROL) 1.25 MG (50000 UNIT) PO CAPS: 50000.0000 [IU] | ORAL_CAPSULE | ORAL | 11 refills | Status: DC

## 2017-03-14 NOTE — Assessment & Plan Note (Signed)
Functionally better so able to be in AL now On the sinemet Will follow with Dr Malvin JohnsPotter

## 2017-03-14 NOTE — Assessment & Plan Note (Signed)
See social history 

## 2017-03-14 NOTE — Assessment & Plan Note (Signed)
Mainly an issue for left shoulder Prefers not to take analgesics--will stop the tramadol Also has lumbar vertebral fractures--will start vitamin D

## 2017-03-14 NOTE — Assessment & Plan Note (Signed)
Lost 25# but has gained some back Eating better since being here

## 2017-03-14 NOTE — Assessment & Plan Note (Signed)
Hard to tell how much is allergies--?nasal drainage also with her throat symptoms Consider stopping AM pepcid

## 2017-03-14 NOTE — Assessment & Plan Note (Signed)
Will probably need local eye doctor

## 2017-03-14 NOTE — Progress Notes (Signed)
Subjective:    Patient ID: Cheryl Cummings, female    DOB: 1940-07-22, 76 y.o.   MRN: [id number]  HPI Assisted living visit to establish care Reviewed past records from [facility] Reviewed status here with Magda Paganini RN Came here from [facility]--- improved enough to come to AL  Parkinson's disease diagnosed earlier this year Tremor goes back a year of os On the sinemet--she finds it hard to tell how much it is helping Uses rollator in building---quad cane in room at night No help for ADLs Generally continent---but wears pad at night  Has glaucoma Rx for a number of years Vision is okay Will be considering her eye care [facility]  On BP med She is unsure about this She wonders if it is white coat syndrome  Has analgesics for apparent arthritis This is not bad and she tries to avoid the medications  Is on H2 blocker This has controlled her reflux  Current Outpatient Prescriptions on File Prior to Visit  Medication Sig Dispense Refill  . amLODipine (NORVASC) 5 MG tablet Take 0.5 tablets (2.5 mg total) by mouth daily. 30 tablet 0  . carbidopa-levodopa (SINEMET IR) 25-100 MG tablet Take 1 tablet by mouth 3 (three) times daily.    . dorzolamide-timolol (COSOPT) 22.3-6.8 MG/ML ophthalmic solution Place 1 drop into both eyes 2 (two) times daily.    . famotidine (PEPCID) 20 MG tablet Take 1 tablet (20 mg total) by mouth 2 (two) times daily. 60 tablet 0  . naproxen (NAPROSYN) 250 MG tablet Take 1 tablet (250 mg total) by mouth 2 (two) times daily with a meal. (Patient taking differently: Take 250 mg by mouth 2 (two) times daily as needed. ) 40 tablet 0  . traMADol (ULTRAM) 50 MG tablet Take 1 tablet (50 mg total) by mouth every 6 (six) hours as needed. 15 tablet 0  . TRAVATAN Z 0.004 % SOLN ophthalmic solution Place 1 drop into both eyes at bedtime.     No current facility-administered medications on file prior to visit.     Allergies  Allergen Reactions  .  Ibuprofen Swelling  . Mushroom Extract Complex Nausea And Vomiting  . Valium [Diazepam] Other (See Comments)    Unknown   . Shellfish Allergy Rash    Past Medical History:  Diagnosis Date  . Essential hypertension, benign   . Generalized osteoarthritis of multiple sites    mild  . GERD (gastroesophageal reflux disease)   . Glaucoma   . Parkinson disease [facility])     Past Surgical History:  Procedure Laterality Date  . VAGINAL DELIVERY     twice    No family history on file.  Social History   Social History  . Marital status: Widowed    Spouse name: N/A  . Number of children: 2  . Years of education: N/A   Occupational History  . Administrator     [employer]   Social History Main Topics  . Smoking status: Never Smoker  . Smokeless tobacco: Never Used  . Alcohol use No  . Drug use: No  . Sexual activity: Not Currently   Other Topics Concern  . Not on file   Social History Narrative   No living will   Would want daughter and son to make health care decisions for her   Would accept resuscitation--but no prolonged ventilation   Would not want extended tube feeds--would accept short term to determine prognosis   Review of Systems  Constitutional: Positive for unexpected weight change. Negative for fatigue.       Has lost over 15# recently with the issues. Starting to eat better  HENT: Positive for hearing loss. Negative for dental problem.        Overdue for dental cleaning  Eyes: Negative for redness and visual disturbance.  Respiratory: Negative for cough, chest tightness and shortness of breath.   Cardiovascular: Negative for chest pain and palpitations.       Some foot swelling of late--- not much better after being in bed (??nutritional)  Gastrointestinal: Negative for abdominal pain, blood in stool and constipation.  Endocrine: Negative for polydipsia and polyuria.  Genitourinary: Negative for dysuria and hematuria.  Musculoskeletal: Positive  for arthralgias. Negative for back pain.       Left shoulder injury in recent fall Working with therapist for this  Skin: Negative for rash.  Allergic/Immunologic: Negative for environmental allergies and immunocompromised state.  Neurological: Positive for tremors and headaches. Negative for dizziness and light-headedness.  Hematological: Negative for adenopathy. Does not bruise/bleed easily.  Psychiatric/Behavioral: Positive for dysphoric mood and suicidal ideas. The patient is not nervous/anxious.        Sad about leaving her home       Objective:   Physical Exam  Constitutional: No distress.  Neck: No thyromegaly present.  Cardiovascular: Normal rate, regular rhythm, normal heart sounds and intact distal pulses.  Exam reveals no gallop.   No murmur heard. Pulmonary/Chest: Effort normal and breath sounds normal. No respiratory distress. She has no wheezes. She has no rales.  Abdominal: Soft. She exhibits no distension. There is no tenderness. There is no rebound and no guarding.  Musculoskeletal:  1+ pitting edema in feet only Very limited abduction of left shoulder  Lymphadenopathy:    She has no cervical adenopathy.  Neurological:  Left finger tremor Slight bradykinesia Slight masked facies  Skin: No rash noted.  Psychiatric: She has a normal mood and affect. Her behavior is normal.          Assessment & Plan:

## 2017-03-14 NOTE — Assessment & Plan Note (Signed)
BP Readings from Last 3 Encounters:  03/14/17 122/82  09/07/16 130/80  03/02/16 (!) 174/86   May be white coat If stays down next time--will wean amlodipine

## 2017-03-21 DIAGNOSIS — M6281 Muscle weakness (generalized): Secondary | ICD-10-CM | POA: Diagnosis not present

## 2017-03-26 DIAGNOSIS — M6281 Muscle weakness (generalized): Secondary | ICD-10-CM | POA: Diagnosis not present

## 2017-03-28 DIAGNOSIS — M6281 Muscle weakness (generalized): Secondary | ICD-10-CM | POA: Diagnosis not present

## 2017-04-02 DIAGNOSIS — M6281 Muscle weakness (generalized): Secondary | ICD-10-CM | POA: Diagnosis not present

## 2017-04-04 DIAGNOSIS — M6281 Muscle weakness (generalized): Secondary | ICD-10-CM | POA: Diagnosis not present

## 2017-04-09 DIAGNOSIS — M6281 Muscle weakness (generalized): Secondary | ICD-10-CM | POA: Diagnosis not present

## 2017-04-11 DIAGNOSIS — M6281 Muscle weakness (generalized): Secondary | ICD-10-CM | POA: Diagnosis not present

## 2017-04-16 DIAGNOSIS — M6281 Muscle weakness (generalized): Secondary | ICD-10-CM | POA: Diagnosis not present

## 2017-04-18 DIAGNOSIS — M6281 Muscle weakness (generalized): Secondary | ICD-10-CM | POA: Diagnosis not present

## 2017-04-23 DIAGNOSIS — M6281 Muscle weakness (generalized): Secondary | ICD-10-CM | POA: Diagnosis not present

## 2017-04-25 DIAGNOSIS — M6281 Muscle weakness (generalized): Secondary | ICD-10-CM | POA: Diagnosis not present

## 2017-04-30 DIAGNOSIS — M6281 Muscle weakness (generalized): Secondary | ICD-10-CM | POA: Diagnosis not present

## 2017-05-02 DIAGNOSIS — M6281 Muscle weakness (generalized): Secondary | ICD-10-CM | POA: Diagnosis not present

## 2017-05-07 DIAGNOSIS — M6281 Muscle weakness (generalized): Secondary | ICD-10-CM | POA: Diagnosis not present

## 2017-05-09 DIAGNOSIS — M6281 Muscle weakness (generalized): Secondary | ICD-10-CM | POA: Diagnosis not present

## 2017-05-14 DIAGNOSIS — M6281 Muscle weakness (generalized): Secondary | ICD-10-CM | POA: Diagnosis not present

## 2017-05-16 DIAGNOSIS — M6281 Muscle weakness (generalized): Secondary | ICD-10-CM | POA: Diagnosis not present

## 2017-05-21 DIAGNOSIS — M6281 Muscle weakness (generalized): Secondary | ICD-10-CM | POA: Diagnosis not present

## 2017-05-23 DIAGNOSIS — M6281 Muscle weakness (generalized): Secondary | ICD-10-CM | POA: Diagnosis not present

## 2017-05-28 DIAGNOSIS — M6281 Muscle weakness (generalized): Secondary | ICD-10-CM | POA: Diagnosis not present

## 2017-05-30 DIAGNOSIS — M6281 Muscle weakness (generalized): Secondary | ICD-10-CM | POA: Diagnosis not present

## 2017-06-03 DIAGNOSIS — M6281 Muscle weakness (generalized): Secondary | ICD-10-CM | POA: Diagnosis not present

## 2017-06-04 DIAGNOSIS — M6281 Muscle weakness (generalized): Secondary | ICD-10-CM | POA: Diagnosis not present

## 2017-06-12 ENCOUNTER — Ambulatory Visit: Payer: Medicare Other | Admitting: Internal Medicine

## 2017-06-12 DIAGNOSIS — G20 Parkinson's disease: Secondary | ICD-10-CM | POA: Diagnosis not present

## 2017-06-12 DIAGNOSIS — K219 Gastro-esophageal reflux disease without esophagitis: Secondary | ICD-10-CM

## 2017-06-12 DIAGNOSIS — M159 Polyosteoarthritis, unspecified: Secondary | ICD-10-CM

## 2017-06-12 DIAGNOSIS — I10 Essential (primary) hypertension: Secondary | ICD-10-CM | POA: Diagnosis not present

## 2017-06-14 ENCOUNTER — Encounter: Payer: Self-pay | Admitting: Internal Medicine

## 2017-06-14 NOTE — Assessment & Plan Note (Signed)
Stable on Sinemet Still ALF appropriate

## 2017-06-14 NOTE — Patient Instructions (Signed)

## 2017-06-14 NOTE — Assessment & Plan Note (Signed)
Will decrease Pepcid to daily in pm per patient request Monitor for increased nausea, bloating and reflux

## 2017-06-14 NOTE — Assessment & Plan Note (Signed)
No c/o pain today Continue Tylenol prn

## 2017-06-14 NOTE — Assessment & Plan Note (Signed)
Controlled off meds  Will monitor 

## 2017-06-14 NOTE — Progress Notes (Signed)
Subjective:    Patient ID: Cheryl Cummings, female    DOB: 1940-08-04, 76 y.o.   MRN: [id number]  HPI  Routine follow up for resident in [address] Reviewed with Magda PaganiniAudrey, [place]. She reports resident c/o fatigue every morning after breakfast. Resident reports she sleeps well and does not feel fatigued when she wakes up, but every morning after breakfast, goes back up to her room for a nap. The resident is insistent that this is related to her meds. Amlodipine has already been stopped, with no improvement in fatigue.  Otherwise doing about the same. Sleeps well. Independent with ADL's. She goes down for all meals. She walks with a cane. She denies any issues with urinary or bowel incontinence.  HTN: Her BP today is 124/76. She is currently not taking any antihypertensive medications.  OA: No complaints of pain today. She reports she recently finished PT for her left shoulder pain. PT help, but she stills feels a little weak. She has been doing exercise in the gym a few days a week to help regain some strength. She has prn Tylenol to take if needed.  GERD: Controlled on Pepcid. She thinks the Pepcid is making her tired and would like to stop her morning dose.  Parkinson's: Stable functional status. She takes Sinemet as prescribed.  Review of Systems      Past Medical History:  Diagnosis Date  . Essential hypertension, benign   . Generalized osteoarthritis of multiple sites    mild  . GERD (gastroesophageal reflux disease)   . Glaucoma   . Parkinson disease [facility](HCC)     Current Outpatient Medications  Medication Sig Dispense Refill  . carbidopa-levodopa (SINEMET IR) 25-100 MG tablet Take 1 tablet by mouth 3 (three) times daily.    . dorzolamide-timolol (COSOPT) 22.3-6.8 MG/ML ophthalmic solution Place 1 drop into both eyes 2 (two) times daily.    . famotidine (PEPCID) 20 MG tablet Take 1 tablet (20 mg total) by mouth 2 (two) times daily. 60 tablet 0  . Multiple Vitamins-Minerals  (MULTIVITAMIN WITH MINERALS) tablet Take 1 tablet by mouth daily.    . naproxen (NAPROSYN) 250 MG tablet Take 1 tablet (250 mg total) by mouth 2 (two) times daily with a meal. (Patient taking differently: Take 250 mg by mouth 2 (two) times daily as needed. ) 40 tablet 0  . TRAVATAN Z 0.004 % SOLN ophthalmic solution Place 1 drop into both eyes at bedtime.    . Vitamin D, Ergocalciferol, (DRISDOL) 50000 units CAPS capsule Take 1 capsule (50,000 Units total) by mouth every 30 (thirty) days. 1 capsule 11  . amLODipine (NORVASC) 5 MG tablet Take 0.5 tablets (2.5 mg total) by mouth daily. (Patient not taking: Reported on 06/14/2017) 30 tablet 0   No current facility-administered medications for this visit.     Allergies  Allergen Reactions  . Ibuprofen Swelling  . Mushroom Extract Complex Nausea And Vomiting  . Valium [Diazepam] Other (See Comments)    Unknown   . Shellfish Allergy Rash    History reviewed. No pertinent family history.  Social History   Socioeconomic History  . Marital status: Widowed    Spouse name: Not on file  . Number of children: 2  . Years of education: Not on file  . Highest education level: Not on file  Social Needs  . Financial resource strain: Not on file  . Food insecurity - worry: Not on file  . Food insecurity - inability: Not on file  .  Transportation needs - medical: Not on file  . Transportation needs - non-medical: Not on file  Occupational History  . Occupation: Production designer, theatre/television/film    Comment: [employer]  Tobacco Use  . Smoking status: Never Smoker  . Smokeless tobacco: Never Used  Substance and Sexual Activity  . Alcohol use: No  . Drug use: No  . Sexual activity: Not Currently  Other Topics Concern  . Not on file  Social History Narrative   No living will   Would want daughter and son to make health care decisions for her   Would accept resuscitation--but no prolonged ventilation   Would not want extended tube feeds--would accept  short term to determine prognosis     Constitutional: Pt reports fatigue. Denies fever, malaise, headache or abrupt weight changes.  Respiratory: Denies difficulty breathing, shortness of breath, cough or sputum production.   Cardiovascular: Denies chest pain, chest tightness, palpitations or swelling in the hands or feet.  Gastrointestinal: Denies abdominal pain, bloating, constipation, diarrhea or blood in the stool.  GU: Denies urgency, frequency, pain with urination, burning sensation, blood in urine, odor or discharge. Musculoskeletal: Denies decrease in range of motion, difficulty with gait, muscle pain or joint pain and swelling.  Skin: Denies redness, rashes, lesions or ulcercations.  Neurological: Pt reports difficulty with balance. Denies dizziness, difficulty with memory, difficulty with speech or problems with coordination.  Psych: Denies anxiety, depression, SI/HI.  No other specific complaints in a complete review of systems (except as listed in HPI above).  Objective:   Physical Exam   BP 124/76   Pulse 76   Temp (!) 97.5 F (36.4 C)   Resp 20   Wt 113 lb (51.3 kg)   BMI 19.40 kg/m  Wt Readings from Last 3 Encounters:  06/14/17 113 lb (51.3 kg)  03/14/17 110 lb 9.6 oz (50.2 kg)  09/06/16 100 lb 1.4 oz (45.4 kg)    General: Appears her stated age, in NAD. Neck:  No adenopathy. Cardiovascular: Normal rate and rhythm. S1,S2 noted.  No murmur, rubs or gallops noted. No JVD or BLE edema.  Pulmonary/Chest: Normal effort and positive vesicular breath sounds. No respiratory distress. No wheezes, rales or ronchi noted.  Abdomen: Soft and nontender. Normal bowel sounds. Neurological: Alert and oriented.   Psychiatric: Mood and affect normal. Behavior is normal. Judgment and thought content normal.     BMET    Component Value Date/Time   NA 139 09/06/2016 0334   NA 142 11/01/2013 1403   K 3.6 09/06/2016 0334   K 3.7 11/01/2013 1403   CL 102 09/06/2016 0334   CL  103 11/01/2013 1403   CO2 31 09/06/2016 0334   CO2 32 11/01/2013 1403   GLUCOSE 110 (H) 09/06/2016 0334   GLUCOSE 106 (H) 11/01/2013 1403   BUN 21 (H) 09/06/2016 0334   BUN 18 11/01/2013 1403   CREATININE 0.84 09/06/2016 0334   CREATININE 0.88 11/01/2013 1403   CALCIUM 9.6 09/06/2016 0334   CALCIUM 9.3 11/01/2013 1403   GFRNONAA >60 09/06/2016 0334   GFRNONAA >60 11/01/2013 1403   GFRAA >60 09/06/2016 0334   GFRAA >60 11/01/2013 1403    Lipid Panel  No results found for: CHOL, TRIG, HDL, CHOLHDL, VLDL, LDLCALC  CBC    Component Value Date/Time   WBC 7.4 09/06/2016 0334   RBC 4.03 09/06/2016 0334   HGB 13.6 09/06/2016 0334   HGB 14.6 11/01/2013 1403   HCT 38.1 09/06/2016 0334   HCT 42.6 11/01/2013  1403   PLT 194 09/06/2016 0334   PLT 185 11/01/2013 1403   MCV 94.5 09/06/2016 0334   MCV 94 11/01/2013 1403   MCH 33.8 09/06/2016 0334   MCHC 35.8 09/06/2016 0334   RDW 13.5 09/06/2016 0334   RDW 13.5 11/01/2013 1403   LYMPHSABS 0.9 (L) 09/06/2016 0334   LYMPHSABS 1.3 11/01/2013 1403   MONOABS 0.6 09/06/2016 0334   MONOABS 0.4 11/01/2013 1403   EOSABS 0.0 09/06/2016 0334   EOSABS 0.0 11/01/2013 1403   BASOSABS 0.0 09/06/2016 0334   BASOSABS 0.0 11/01/2013 1403    Hgb A1C No results found for: HGBA1C         Assessment & Plan:

## 2017-08-05 DIAGNOSIS — R262 Difficulty in walking, not elsewhere classified: Secondary | ICD-10-CM | POA: Diagnosis not present

## 2017-08-05 DIAGNOSIS — R251 Tremor, unspecified: Secondary | ICD-10-CM | POA: Diagnosis not present

## 2017-08-05 DIAGNOSIS — G20 Parkinson's disease: Secondary | ICD-10-CM | POA: Diagnosis not present

## 2017-09-12 ENCOUNTER — Ambulatory Visit: Payer: Medicare Other | Admitting: Internal Medicine

## 2017-09-12 ENCOUNTER — Encounter: Payer: Self-pay | Admitting: Internal Medicine

## 2017-09-12 VITALS — BP 120/90 | HR 72 | Temp 98.2°F | Resp 18 | Wt 113.8 lb

## 2017-09-12 DIAGNOSIS — G20 Parkinson's disease: Secondary | ICD-10-CM

## 2017-09-12 DIAGNOSIS — I10 Essential (primary) hypertension: Secondary | ICD-10-CM | POA: Diagnosis not present

## 2017-09-12 DIAGNOSIS — M159 Polyosteoarthritis, unspecified: Secondary | ICD-10-CM

## 2017-09-12 DIAGNOSIS — E441 Mild protein-calorie malnutrition: Secondary | ICD-10-CM | POA: Diagnosis not present

## 2017-09-12 DIAGNOSIS — K219 Gastro-esophageal reflux disease without esophagitis: Secondary | ICD-10-CM | POA: Diagnosis not present

## 2017-09-12 MED ORDER — NAPROXEN 250 MG PO TABS: 250.0000 mg | ORAL_TABLET | Freq: Two times a day (BID) | ORAL | 0 refills | Status: DC | PRN

## 2017-09-12 NOTE — Assessment & Plan Note (Signed)
BP Readings from Last 3 Encounters:  09/12/17 120/90  06/14/17 124/76  03/14/17 122/82   Still fine without meds Would not push even if harder due to Parkinson's

## 2017-09-12 NOTE — Assessment & Plan Note (Signed)
Controlled with the famotidine Discussed that some of her symptoms are from the PD

## 2017-09-12 NOTE — Assessment & Plan Note (Signed)
Stable functional status on the sinemet Doing well in AL

## 2017-09-12 NOTE — Assessment & Plan Note (Signed)
Mostly left shoulder No major issues

## 2017-09-12 NOTE — Progress Notes (Signed)
Subjective:    Patient ID: Cheryl Cummings, female    DOB: 05-Aug-1940, 77 y.o.   MRN: [id number]  HPI Visit in her assisted living apartment to review chronic health conditions Reviewed status with Magda PaganiniAudrey RN No new concerns from her  Doing fairly well Feels the meds have "stabilized" the Parkinson's  Recent visit with Dr Elinor DodgePotter--no changes Independent with ADLs Uses the quad cane for stability--no recent falls rollator when out of her room  No heartburn on the famotidine She does note some drooling and drainage--discussed that this is from the PD No sig dysphagia   Left shoulder still bad Left hand still doesn't work well--not sure why that happened  No chest pain No SOB No dizziness or syncope Mild stable edema  Current Outpatient Medications on File Prior to Visit  Medication Sig Dispense Refill  . carbidopa-levodopa (SINEMET IR) 25-100 MG tablet Take 1 tablet by mouth 3 (three) times daily.    . dorzolamide-timolol (COSOPT) 22.3-6.8 MG/ML ophthalmic solution Place 1 drop into both eyes 2 (two) times daily.    . famotidine (PEPCID) 20 MG tablet Take 1 tablet (20 mg total) by mouth 2 (two) times daily. 60 tablet 0  . Multiple Vitamins-Minerals (MULTIVITAMIN WITH MINERALS) tablet Take 1 tablet by mouth daily.    . TRAVATAN Z 0.004 % SOLN ophthalmic solution Place 1 drop into both eyes at bedtime.    . Vitamin D, Ergocalciferol, (DRISDOL) 50000 units CAPS capsule Take 1 capsule (50,000 Units total) by mouth every 30 (thirty) days. 1 capsule 11   No current facility-administered medications on file prior to visit.     Allergies  Allergen Reactions  . Ibuprofen Swelling  . Mushroom Extract Complex Nausea And Vomiting  . Valium [Diazepam] Other (See Comments)    Unknown   . Shellfish Allergy Rash    Past Medical History:  Diagnosis Date  . Essential hypertension, benign   . Generalized osteoarthritis of multiple sites    mild  . GERD (gastroesophageal reflux  disease)   . Glaucoma   . Parkinson disease [facility](HCC)     Past Surgical History:  Procedure Laterality Date  . VAGINAL DELIVERY     twice    History reviewed. No pertinent family history.  Social History   Socioeconomic History  . Marital status: Widowed    Spouse name: Not on file  . Number of children: 2  . Years of education: Not on file  . Highest education level: Not on file  Social Needs  . Financial resource strain: Not on file  . Food insecurity - worry: Not on file  . Food insecurity - inability: Not on file  . Transportation needs - medical: Not on file  . Transportation needs - non-medical: Not on file  Occupational History  . Occupation: Production designer, theatre/television/filmAdministrator    Comment: [employer]  Tobacco Use  . Smoking status: Never Smoker  . Smokeless tobacco: Never Used  Substance and Sexual Activity  . Alcohol use: No  . Drug use: No  . Sexual activity: Not Currently  Other Topics Concern  . Not on file  Social History Narrative   No living will   Would want daughter and son to make health care decisions for her   Would accept resuscitation--but no prolonged ventilation   Would not want extended tube feeds--would accept short term to determine prognosis   Review of Systems Appetite is good--enjoys the food Weight has increased  Works on exercise---walks around the pool area  and doing some of the machines Sleeps okay--up for nocturia.  No abnormal dreams Bowels are fine---goes daily in general     Objective:   Physical Exam  Constitutional: No distress.  Neck: No thyromegaly present.  Cardiovascular: Normal rate, regular rhythm and normal heart sounds.  Faint pedal pulses  Pulmonary/Chest: Effort normal and breath sounds normal. No respiratory distress. She has no wheezes. She has no rales.  Abdominal: Soft. There is no tenderness.  Musculoskeletal:  1+ edema in feet  Lymphadenopathy:    She has no cervical adenopathy.  Neurological:  Mild resting  tremor in hands Mild bradykinesia  Skin: No rash noted.  Psychiatric: She has a normal mood and affect. Her behavior is normal.          Assessment & Plan:

## 2017-09-12 NOTE — Assessment & Plan Note (Signed)
Has regained some weight Still under her normal weight--but better

## 2017-10-14 DIAGNOSIS — R319 Hematuria, unspecified: Secondary | ICD-10-CM | POA: Diagnosis not present

## 2017-10-16 ENCOUNTER — Ambulatory Visit: Payer: Medicare Other | Admitting: Internal Medicine

## 2017-10-16 ENCOUNTER — Encounter: Payer: Self-pay | Admitting: Internal Medicine

## 2017-10-16 VITALS — BP 124/70 | HR 80 | Temp 97.9°F | Resp 20 | Wt 113.0 lb

## 2017-10-16 DIAGNOSIS — N814 Uterovaginal prolapse, unspecified: Secondary | ICD-10-CM | POA: Diagnosis not present

## 2017-10-16 NOTE — Patient Instructions (Signed)
Pelvic Organ Prolapse Pelvic organ prolapse is the stretching, bulging, or dropping of pelvic organs into an abnormal position. It happens when the muscles and tissues that surround and support pelvic structures are stretched or weak. Pelvic organ prolapse can involve:  Vagina (vaginal prolapse).  Uterus (uterine prolapse).  Bladder (cystocele).  Rectum (rectocele).  Intestines (enterocele).  When organs other than the vagina are involved, they often bulge into the vagina or protrude from the vagina, depending on how severe the prolapse is. What are the causes? Causes of this condition include:  Pregnancy, labor, and childbirth.  Long-lasting (chronic) cough.  Chronic constipation.  Obesity.  Past pelvic surgery.  Aging. During and after menopause, a decreased production of the hormone estrogen can weaken pelvic ligaments and muscles.  Consistently lifting more than 50 lb (23 kg).  Buildup of fluid in the abdomen due to certain diseases and other conditions.  What are the signs or symptoms? Symptoms of this condition include:  Loss of bladder control when you cough, sneeze, strain, and exercise (stress incontinence). This may be worse immediately following childbirth, and it may gradually improve over time.  Feeling pressure in your pelvis or vagina. This pressure may increase when you cough or when you are having a bowel movement.  A bulge that protrudes from the opening of your vagina or against your vaginal wall. If your uterus protrudes through the opening of your vagina and rubs against your clothing, you may also experience soreness, ulcers, infection, pain, and bleeding.  Increased effort to have a bowel movement or urinate.  Pain in your low back.  Pain, discomfort, or disinterest in sexual intercourse.  Repeated bladder infections (urinary tract infections).  Difficulty inserting or inability to insert a tampon or applicator.  In some people, this  condition does not cause any symptoms. How is this diagnosed? Your health care provider may perform an internal and external vaginal and rectal exam. During the exam, you may be asked to cough and strain while you are lying down, sitting, and standing up. Your health care provider will determine if other tests are required, such as bladder function tests. How is this treated? In most cases, this condition needs to be treated only if it produces symptoms. No treatment is guaranteed to correct the prolapse or relieve the symptoms completely. Treatment may include:  Lifestyle changes, such as: ? Avoiding drinking beverages that contain caffeine. ? Increasing your intake of high-fiber foods. This can help to decrease constipation and straining during bowel movements. ? Emptying your bladder at scheduled times (bladder training therapy). This can help to reduce or avoid urinary incontinence. ? Losing weight if you are overweight or obese.  Estrogen. Estrogen may help mild prolapse by increasing the strength and tone of pelvic floor muscles.  Kegel exercises. These may help mild cases of prolapse by strengthening and tightening the muscles of the pelvic floor.  Pessary insertion. A pessary is a soft, flexible device that is placed into your vagina by your health care provider to help support the vaginal walls and keep pelvic organs in place.  Surgery. This is often the only form of treatment for severe prolapse. Different types of surgeries are available.  Follow these instructions at home:  Wear a sanitary pad or absorbent product if you have urinary incontinence.  Avoid heavy lifting and straining with exercise and work. Do not hold your breath when you perform mild to moderate lifting and exercise activities. Limit your activities as directed by your health care   provider.  Take medicines only as directed by your health care provider.  Perform Kegel exercises as directed by your health care  provider.  If you have a pessary, take care of it as directed by your health care provider. Contact a health care provider if:  Your symptoms interfere with your daily activities or sex life.  You need medicine to help with the discomfort.  You notice bleeding from the vagina that is not related to your period.  You have a fever.  You have pain or bleeding when you urinate.  You have bleeding when you have a bowel movement.  You lose urine when you have sex.  You have chronic constipation.  You have a pessary that falls out.  You have vaginal discharge that has a bad smell.  You have low abdominal pain or cramping that is unusual for you. This information is not intended to replace advice given to you by your health care provider. Make sure you discuss any questions you have with your health care provider. Document Released: 01/27/2014 Document Revised: 12/08/2015 Document Reviewed: 09/14/2013 Elsevier Interactive Patient Education  2018 [employer]vier Inc[employer]  

## 2017-10-16 NOTE — Progress Notes (Signed)
Subjective:    Patient ID: Cheryl Cummings, female    DOB: 1941/06/20, 77 y.o.   MRN: [id number]  HPI  Asked to check resident in [address] Monday, had an episode of urinary/vaginal bleeding, when wiping. She reports the blood was bright red. It continued throughout the day Monday, but has not had any bleeding since. She denies urinary urgency, frequency or dysuria. She denies abdominal, pelvic or low back pain. Her bowels are moving normally. RN noticed something that appears to be her uterus prolapsing from the vagina. Urine culture did not show any bacteria. She has not taken anything OTC for her symptoms.   Review of Systems      Past Medical History:  Diagnosis Date  . Essential hypertension, benign   . Generalized osteoarthritis of multiple sites    mild  . GERD (gastroesophageal reflux disease)   . Glaucoma   . Parkinson disease [facility])     Current Outpatient Medications  Medication Sig Dispense Refill  . carbidopa-levodopa (SINEMET IR) 25-100 MG tablet Take 1 tablet by mouth 3 (three) times daily.    . dorzolamide-timolol (COSOPT) 22.3-6.8 MG/ML ophthalmic solution Place 1 drop into both eyes 2 (two) times daily.    . famotidine (PEPCID) 20 MG tablet Take 1 tablet (20 mg total) by mouth 2 (two) times daily. 60 tablet 0  . Multiple Vitamins-Minerals (MULTIVITAMIN WITH MINERALS) tablet Take 1 tablet by mouth daily.    . naproxen (NAPROSYN) 250 MG tablet Take 1 tablet (250 mg total) by mouth 2 (two) times daily as needed. 1 tablet 0  . TRAVATAN Z 0.004 % SOLN ophthalmic solution Place 1 drop into both eyes at bedtime.    . Vitamin D, Ergocalciferol, (DRISDOL) 50000 units CAPS capsule Take 1 capsule (50,000 Units total) by mouth every 30 (thirty) days. 1 capsule 11   No current facility-administered medications for this visit.     Allergies  Allergen Reactions  . Ibuprofen Swelling  . Mushroom Extract Complex Nausea And Vomiting  . Valium [Diazepam] Other (See Comments)      Unknown   . Shellfish Allergy Rash    History reviewed. No pertinent family history.  Social History   Socioeconomic History  . Marital status: Widowed    Spouse name: Not on file  . Number of children: 2  . Years of education: Not on file  . Highest education level: Not on file  Occupational History  . Occupation: Production designer, theatre/television/film    Comment: [employer]  Social Needs  . Financial resource strain: Not on file  . Food insecurity:    Worry: Not on file    Inability: Not on file  . Transportation needs:    Medical: Not on file    Non-medical: Not on file  Tobacco Use  . Smoking status: Never Smoker  . Smokeless tobacco: Never Used  Substance and Sexual Activity  . Alcohol use: No  . Drug use: No  . Sexual activity: Not Currently  Lifestyle  . Physical activity:    Days per week: Not on file    Minutes per session: Not on file  . Stress: Not on file  Relationships  . Social connections:    Talks on phone: Not on file    Gets together: Not on file    Attends religious service: Not on file    Active member of club or organization: Not on file    Attends meetings of clubs or organizations: Not on file  Relationship status: Not on file  . Intimate partner violence:    Fear of current or ex partner: Not on file    Emotionally abused: Not on file    Physically abused: Not on file    Forced sexual activity: Not on file  Other Topics Concern  . Not on file  Social History Narrative   No living will   Would want daughter and son to make health care decisions for her   Would accept resuscitation--but no prolonged ventilation   Would not want extended tube feeds--would accept short term to determine prognosis     Constitutional: Denies fever, malaise, fatigue, headache or abrupt weight changes.  Gastrointestinal: Denies abdominal pain, bloating, constipation, diarrhea or blood in the stool.  GU: Pt reports urethral/vaginal bleeding. Denies urgency,  frequency, pain with urination, burning sensation,  odor or discharge.  No other specific complaints in a complete review of systems (except as listed in HPI above).  Objective:   Physical Exam   There were no vitals taken for this visit. Wt Readings from Last 3 Encounters:  09/12/17 113 lb 12.8 oz (51.6 kg)  06/14/17 113 lb (51.3 kg)  03/14/17 110 lb 9.6 oz (50.2 kg)    General: Appears her stated age, in NAD. Skin: Warm, dry and intact. No rashes noted in the pelvic region. Abdomen: Soft and nontender. Normal bowel sounds. No distention or masses noted.  Pelvic: Normal female anatomy. Cervix noted at vaginal opening. No active bleeding noted.  BMET    Component Value Date/Time   NA 139 09/06/2016 0334   NA 142 11/01/2013 1403   K 3.6 09/06/2016 0334   K 3.7 11/01/2013 1403   CL 102 09/06/2016 0334   CL 103 11/01/2013 1403   CO2 31 09/06/2016 0334   CO2 32 11/01/2013 1403   GLUCOSE 110 (H) 09/06/2016 0334   GLUCOSE 106 (H) 11/01/2013 1403   BUN 21 (H) 09/06/2016 0334   BUN 18 11/01/2013 1403   CREATININE 0.84 09/06/2016 0334   CREATININE 0.88 11/01/2013 1403   CALCIUM 9.6 09/06/2016 0334   CALCIUM 9.3 11/01/2013 1403   GFRNONAA >60 09/06/2016 0334   GFRNONAA >60 11/01/2013 1403   GFRAA >60 09/06/2016 0334   GFRAA >60 11/01/2013 1403    Lipid Panel  No results found for: CHOL, TRIG, HDL, CHOLHDL, VLDL, LDLCALC  CBC    Component Value Date/Time   WBC 7.4 09/06/2016 0334   RBC 4.03 09/06/2016 0334   HGB 13.6 09/06/2016 0334   HGB 14.6 11/01/2013 1403   HCT 38.1 09/06/2016 0334   HCT 42.6 11/01/2013 1403   PLT 194 09/06/2016 0334   PLT 185 11/01/2013 1403   MCV 94.5 09/06/2016 0334   MCV 94 11/01/2013 1403   MCH 33.8 09/06/2016 0334   MCHC 35.8 09/06/2016 0334   RDW 13.5 09/06/2016 0334   RDW 13.5 11/01/2013 1403   LYMPHSABS 0.9 (L) 09/06/2016 0334   LYMPHSABS 1.3 11/01/2013 1403   MONOABS 0.6 09/06/2016 0334   MONOABS 0.4 11/01/2013 1403   EOSABS 0.0  09/06/2016 0334   EOSABS 0.0 11/01/2013 1403   BASOSABS 0.0 09/06/2016 0334   BASOSABS 0.0 11/01/2013 1403    Hgb A1C No results found for: HGBA1C        Assessment & Plan:   Uterine Prolapse:  Advised her that there is no intervention needed at this time Discussed kegel exercises and pelvic PT, but she wants to hold off on that for now. Discussed if recurrent  bleeding, pelvic pain, feeling the uterus protruding out of the vagina, can refer to GYN for further evaluation.  Will reassess as needed Nicki ReaperBAITY, REGINA, NP

## 2017-11-28 DIAGNOSIS — I10 Essential (primary) hypertension: Secondary | ICD-10-CM | POA: Diagnosis not present

## 2018-01-02 ENCOUNTER — Encounter: Payer: Self-pay | Admitting: Internal Medicine

## 2018-01-02 ENCOUNTER — Ambulatory Visit: Payer: Medicare Other | Admitting: Internal Medicine

## 2018-01-02 VITALS — BP 153/94 | HR 70 | Temp 98.2°F | Resp 16 | Wt 108.8 lb

## 2018-01-02 DIAGNOSIS — G20A1 Parkinson's disease without dyskinesia, without mention of fluctuations: Secondary | ICD-10-CM

## 2018-01-02 DIAGNOSIS — I10 Essential (primary) hypertension: Secondary | ICD-10-CM

## 2018-01-02 DIAGNOSIS — G20 Parkinson's disease: Secondary | ICD-10-CM

## 2018-01-02 DIAGNOSIS — E441 Mild protein-calorie malnutrition: Secondary | ICD-10-CM

## 2018-01-02 DIAGNOSIS — M419 Scoliosis, unspecified: Secondary | ICD-10-CM | POA: Diagnosis not present

## 2018-01-02 DIAGNOSIS — K219 Gastro-esophageal reflux disease without esophagitis: Secondary | ICD-10-CM | POA: Diagnosis not present

## 2018-01-02 NOTE — Assessment & Plan Note (Signed)
BP Readings from Last 3 Encounters:  01/02/18 (!) 153/94  10/16/17 124/70  09/12/17 120/90   Chronic white coat HTN Would not be aggressive due to risk for orthostasis also

## 2018-01-02 NOTE — Assessment & Plan Note (Signed)
Chronic left shoulder/arm problems not clearly related

## 2018-01-02 NOTE — Progress Notes (Signed)
Subjective:    Patient ID: Cheryl Cummings, female    DOB: 1941/06/12, 77 y.o.   MRN: [id number]  HPI Visit in AL room at [facility] for review of chronic medical conditions Reviewed status with facility RN--no new concerns  Continues on the Parkinson's meds Does feel sleepy after the first one of the day Will have breakfast--then needs to take a nap Remains independent with all ADLs Walks with rollator outside room---quad cane inside room (like at night to go to bathroom)  Eating okay Weight went up --then back down a little Still adjusting to [employer] food  No heartburn issues No dysphagia  No chest pain No SOB No headaches No sig edema--just some puffiness Slight dizziness if she moves too quickly No syncope  Current Outpatient Medications on File Prior to Visit  Medication Sig Dispense Refill  . carbidopa-levodopa (SINEMET IR) 25-100 MG tablet Take 1 tablet by mouth 3 (three) times daily.    . dorzolamide-timolol (COSOPT) 22.3-6.8 MG/ML ophthalmic solution Place 1 drop into both eyes 2 (two) times daily.    . famotidine (PEPCID) 20 MG tablet Take 1 tablet (20 mg total) by mouth 2 (two) times daily. 60 tablet 0  . Multiple Vitamins-Minerals (MULTIVITAMIN WITH MINERALS) tablet Take 1 tablet by mouth daily.    . naproxen (NAPROSYN) 250 MG tablet Take 1 tablet (250 mg total) by mouth 2 (two) times daily as needed. 1 tablet 0  . TRAVATAN Z 0.004 % SOLN ophthalmic solution Place 1 drop into both eyes at bedtime.    . Vitamin D, Ergocalciferol, (DRISDOL) 50000 units CAPS capsule Take 1 capsule (50,000 Units total) by mouth every 30 (thirty) days. 1 capsule 11   No current facility-administered medications on file prior to visit.     Allergies  Allergen Reactions  . Ibuprofen Swelling  . Mushroom Extract Complex Nausea And Vomiting  . Valium [Diazepam] Other (See Comments)    Unknown   . Shellfish Allergy Rash    Past Medical History:  Diagnosis Date  . Essential  hypertension, benign   . Generalized osteoarthritis of multiple sites    mild  . GERD (gastroesophageal reflux disease)   . Glaucoma   . Parkinson disease [facility])     Past Surgical History:  Procedure Laterality Date  . VAGINAL DELIVERY     twice    History reviewed. No pertinent family history.  Social History   Socioeconomic History  . Marital status: Widowed    Spouse name: Not on file  . Number of children: 2  . Years of education: Not on file  . Highest education level: Not on file  Occupational History  . Occupation: Production designer, theatre/television/film    Comment: [employer]  Social Needs  . Financial resource strain: Not on file  . Food insecurity:    Worry: Not on file    Inability: Not on file  . Transportation needs:    Medical: Not on file    Non-medical: Not on file  Tobacco Use  . Smoking status: Never Smoker  . Smokeless tobacco: Never Used  Substance and Sexual Activity  . Alcohol use: No  . Drug use: No  . Sexual activity: Not Currently  Lifestyle  . Physical activity:    Days per week: Not on file    Minutes per session: Not on file  . Stress: Not on file  Relationships  . Social connections:    Talks on phone: Not on file    Gets together:  Not on file    Attends religious service: Not on file    Active member of club or organization: Not on file    Attends meetings of clubs or organizations: Not on file    Relationship status: Not on file  . Intimate partner violence:    Fear of current or ex partner: Not on file    Emotionally abused: Not on file    Physically abused: Not on file    Forced sexual activity: Not on file  Other Topics Concern  . Not on file  Social History Narrative   No living will   Would want daughter and son to make health care decisions for her   Would accept resuscitation--but no prolonged ventilation   Would not want extended tube feeds--would accept short term to determine prognosis   Review of Systems  No regular  cough Sleeps fairly well--up once to void Bowels are fine---usually every day Generally happy--- deaing with some paperwork financially (more of a challenge and takes longer). Does get involved in some of the activities     Objective:   Physical Exam  Neck: No thyromegaly present.  Respiratory: Effort normal and breath sounds normal. No respiratory distress. She has no wheezes. She has no rales.  GI: Soft. There is no tenderness.  Musculoskeletal: She exhibits no edema.  Kyphoscoliosis with convex to left  Lymphadenopathy:    She has no cervical adenopathy.  Neurological:  No tremor Tone fairly normal Mild masked facies  Psychiatric: She has a normal mood and affect. Her behavior is normal.           Assessment & Plan:

## 2018-01-02 NOTE — Assessment & Plan Note (Signed)
Doing well Functionally independent with ADLs Low dose sinemet

## 2018-01-02 NOTE — Assessment & Plan Note (Signed)
Quiet on the famotidine 

## 2018-01-02 NOTE — Assessment & Plan Note (Signed)
Good appetite No Rx for now

## 2018-03-28 ENCOUNTER — Ambulatory Visit: Payer: Medicare Other | Admitting: Internal Medicine

## 2018-03-28 ENCOUNTER — Encounter: Payer: Self-pay | Admitting: Internal Medicine

## 2018-03-28 DIAGNOSIS — G20 Parkinson's disease: Secondary | ICD-10-CM

## 2018-03-28 DIAGNOSIS — I10 Essential (primary) hypertension: Secondary | ICD-10-CM

## 2018-03-28 DIAGNOSIS — M159 Polyosteoarthritis, unspecified: Secondary | ICD-10-CM

## 2018-03-28 DIAGNOSIS — K219 Gastro-esophageal reflux disease without esophagitis: Secondary | ICD-10-CM | POA: Diagnosis not present

## 2018-03-28 DIAGNOSIS — G20A1 Parkinson's disease without dyskinesia, without mention of fluctuations: Secondary | ICD-10-CM

## 2018-03-28 NOTE — Assessment & Plan Note (Signed)
Reasonable off meds Will monitor for now

## 2018-03-28 NOTE — Progress Notes (Signed)
Subjective:    Patient ID: Cheryl Cummings, female    DOB: 1940-08-13, 77 y.o.   MRN: [id number]  HPI  Asked to see resident in [address] Reviewed with RN, no new concerns Resident reports she sleeps well, occasionally interrupted by having to get up and use bathroom Independent with ADL's. Walks with 4 prong cane in room, uses rolling walker for longer distances. Goes to boxing weekly, walks 2 x day. Appetite good, weight up 5 lbs. Bowels okay. She denies pain, reflux or SOB.  HTN: BP reasonable off meds.  OA: She has no c/o pain at this time. She has Naproxen to take if needed.  GERD: She denies breakthrough symptoms on Famotidine. There is no UGI on file.  Parkinson's: Stable bradykinesia on Sinemet. She denies tremor.  Review of Systems  Past Medical History:  Diagnosis Date  . Essential hypertension, benign   . Generalized osteoarthritis of multiple sites    mild  . GERD (gastroesophageal reflux disease)   . Glaucoma   . Parkinson disease [facility])     Current Outpatient Medications  Medication Sig Dispense Refill  . carbidopa-levodopa (SINEMET IR) 25-100 MG tablet Take 1 tablet by mouth 3 (three) times daily.    . dorzolamide-timolol (COSOPT) 22.3-6.8 MG/ML ophthalmic solution Place 1 drop into both eyes 2 (two) times daily.    . famotidine (PEPCID) 20 MG tablet Take 1 tablet (20 mg total) by mouth 2 (two) times daily. 60 tablet 0  . Multiple Vitamins-Minerals (MULTIVITAMIN WITH MINERALS) tablet Take 1 tablet by mouth daily.    . TRAVATAN Z 0.004 % SOLN ophthalmic solution Place 1 drop into both eyes at bedtime.    . Vitamin D, Ergocalciferol, (DRISDOL) 50000 units CAPS capsule Take 1 capsule (50,000 Units total) by mouth every 30 (thirty) days. 1 capsule 11  . naproxen (NAPROSYN) 250 MG tablet Take 1 tablet (250 mg total) by mouth 2 (two) times daily as needed. (Patient not taking: Reported on 03/28/2018) 1 tablet 0   No current facility-administered medications for this  visit.     Allergies  Allergen Reactions  . Ibuprofen Swelling  . Mushroom Extract Complex Nausea And Vomiting  . Valium [Diazepam] Other (See Comments)    Unknown   . Shellfish Allergy Rash    History reviewed. No pertinent family history.  Social History   Socioeconomic History  . Marital status: Widowed    Spouse name: Not on file  . Number of children: 2  . Years of education: Not on file  . Highest education level: Not on file  Occupational History  . Occupation: Production designer, theatre/television/film    Comment: [employer]  Social Needs  . Financial resource strain: Not on file  . Food insecurity:    Worry: Not on file    Inability: Not on file  . Transportation needs:    Medical: Not on file    Non-medical: Not on file  Tobacco Use  . Smoking status: Never Smoker  . Smokeless tobacco: Never Used  Substance and Sexual Activity  . Alcohol use: No  . Drug use: No  . Sexual activity: Not Currently  Lifestyle  . Physical activity:    Days per week: Not on file    Minutes per session: Not on file  . Stress: Not on file  Relationships  . Social connections:    Talks on phone: Not on file    Gets together: Not on file    Attends religious service: Not on  file    Active member of club or organization: Not on file    Attends meetings of clubs or organizations: Not on file    Relationship status: Not on file  . Intimate partner violence:    Fear of current or ex partner: Not on file    Emotionally abused: Not on file    Physically abused: Not on file    Forced sexual activity: Not on file  Other Topics Concern  . Not on file  Social History Narrative   No living will   Would want daughter and son to make health care decisions for her   Would accept resuscitation--but no prolonged ventilation   Would not want extended tube feeds--would accept short term to determine prognosis     Constitutional: Denies fever, malaise, fatigue, headache or abrupt weight changes.    HEENT: Denies eye pain, eye redness, ear pain, ringing in the ears, wax buildup, runny nose, nasal congestion, bloody nose, or sore throat. Respiratory: Denies difficulty breathing, shortness of breath, cough or sputum production.   Cardiovascular: Denies chest pain, chest tightness, palpitations or swelling in the hands or feet.  Gastrointestinal: Denies abdominal pain, bloating, constipation, diarrhea or blood in the stool.  GU: Denies urgency, frequency, pain with urination, burning sensation, blood in urine, odor or discharge. Musculoskeletal: Pt reports intermittent joint pain. Denies decrease in range of motion, difficulty with gait, muscle pain or joint swelling.  Skin: Denies redness, rashes, lesions or ulcercations.  Neurological: Denies dizziness, difficulty with memory, difficulty with speech or problems with balance and coordination.  Psych: Denies anxiety, depression, SI/HI.  No other specific complaints in a complete review of systems (except as listed in HPI above).     Objective:   Physical Exam  BP (!) 159/98   Pulse 80   Temp 97.8 F (36.6 C)   Resp 16   Wt 112 lb 12.8 oz (51.2 kg)   BMI 19.36 kg/m  Wt Readings from Last 3 Encounters:  03/28/18 112 lb 12.8 oz (51.2 kg)  01/02/18 108 lb 12.8 oz (49.4 kg)  10/16/17 113 lb (51.3 kg)    General: Appears her stated age, in NAD. Neck:  Neck supple, trachea midline. No masses, lumps or thyromegaly present.  Cardiovascular: Normal rate and rhythm. S1,S2 noted.  No murmur, rubs or gallops noted. No BLE edema. Pulmonary/Chest: Normal effort and positive vesicular breath sounds. No respiratory distress. No wheezes, rales or ronchi noted.  Abdomen: Soft and nontender. Normal bowel sounds. No distention or masses noted. Neurological: Alert and oriented. Repeats herself quite a bit.    BMET    Component Value Date/Time   NA 139 09/06/2016 0334   NA 142 11/01/2013 1403   K 3.6 09/06/2016 0334   K 3.7 11/01/2013 1403    CL 102 09/06/2016 0334   CL 103 11/01/2013 1403   CO2 31 09/06/2016 0334   CO2 32 11/01/2013 1403   GLUCOSE 110 (H) 09/06/2016 0334   GLUCOSE 106 (H) 11/01/2013 1403   BUN 21 (H) 09/06/2016 0334   BUN 18 11/01/2013 1403   CREATININE 0.84 09/06/2016 0334   CREATININE 0.88 11/01/2013 1403   CALCIUM 9.6 09/06/2016 0334   CALCIUM 9.3 11/01/2013 1403   GFRNONAA >60 09/06/2016 0334   GFRNONAA >60 11/01/2013 1403   GFRAA >60 09/06/2016 0334   GFRAA >60 11/01/2013 1403    Lipid Panel  No results found for: CHOL, TRIG, HDL, CHOLHDL, VLDL, LDLCALC  CBC    Component Value Date/Time  WBC 7.4 09/06/2016 0334   RBC 4.03 09/06/2016 0334   HGB 13.6 09/06/2016 0334   HGB 14.6 11/01/2013 1403   HCT 38.1 09/06/2016 0334   HCT 42.6 11/01/2013 1403   PLT 194 09/06/2016 0334   PLT 185 11/01/2013 1403   MCV 94.5 09/06/2016 0334   MCV 94 11/01/2013 1403   MCH 33.8 09/06/2016 0334   MCHC 35.8 09/06/2016 0334   RDW 13.5 09/06/2016 0334   RDW 13.5 11/01/2013 1403   LYMPHSABS 0.9 (L) 09/06/2016 0334   LYMPHSABS 1.3 11/01/2013 1403   MONOABS 0.6 09/06/2016 0334   MONOABS 0.4 11/01/2013 1403   EOSABS 0.0 09/06/2016 0334   EOSABS 0.0 11/01/2013 1403   BASOSABS 0.0 09/06/2016 0334   BASOSABS 0.0 11/01/2013 1403    Hgb A1C No results found for: HGBA1C          Assessment & Plan:

## 2018-03-28 NOTE — Assessment & Plan Note (Signed)
Encouraged regular activity Has Naproxen prn

## 2018-03-28 NOTE — Assessment & Plan Note (Signed)
Stable functional status ALF appropriate, appreciate care Continue Sinemet

## 2018-03-28 NOTE — Assessment & Plan Note (Signed)
Stable on Famotidine Will monitor

## 2018-07-02 DIAGNOSIS — H401131 Primary open-angle glaucoma, bilateral, mild stage: Secondary | ICD-10-CM | POA: Diagnosis not present

## 2018-07-10 ENCOUNTER — Ambulatory Visit: Payer: Medicare Other | Admitting: Internal Medicine

## 2018-07-10 ENCOUNTER — Encounter: Payer: Self-pay | Admitting: Internal Medicine

## 2018-07-10 VITALS — BP 122/66 | HR 78 | Temp 98.1°F | Resp 16 | Wt 115.2 lb

## 2018-07-10 DIAGNOSIS — G20 Parkinson's disease: Secondary | ICD-10-CM

## 2018-07-10 DIAGNOSIS — K219 Gastro-esophageal reflux disease without esophagitis: Secondary | ICD-10-CM | POA: Diagnosis not present

## 2018-07-10 DIAGNOSIS — I10 Essential (primary) hypertension: Secondary | ICD-10-CM | POA: Diagnosis not present

## 2018-07-10 DIAGNOSIS — E441 Mild protein-calorie malnutrition: Secondary | ICD-10-CM | POA: Diagnosis not present

## 2018-07-10 DIAGNOSIS — G8929 Other chronic pain: Secondary | ICD-10-CM | POA: Diagnosis not present

## 2018-07-10 DIAGNOSIS — M25512 Pain in left shoulder: Secondary | ICD-10-CM | POA: Diagnosis not present

## 2018-07-10 DIAGNOSIS — G20A1 Parkinson's disease without dyskinesia, without mention of fluctuations: Secondary | ICD-10-CM

## 2018-07-10 NOTE — Assessment & Plan Note (Signed)
Weight has stabilized now.   

## 2018-07-10 NOTE — Assessment & Plan Note (Signed)
Wonders about therapy reevaluation ----especially to help with her own exercise program

## 2018-07-10 NOTE — Assessment & Plan Note (Signed)
Symptoms still controlled --now on just bedtime famotidine

## 2018-07-10 NOTE — Assessment & Plan Note (Signed)
Mild and function is stable on sinemet

## 2018-07-10 NOTE — Progress Notes (Signed)
Subjective:    Patient ID: Cheryl Cummings, female    DOB: Apr 08, 1941, 77 y.o.   MRN: [id number]  HPI Visit in her assisted living apartment for follow up of chronic health conditions Reviewed status with Magda PaganiniAudrey RN---no new concerns  Ongoing Rx for Parkinsons Uses rollator to go down to the dining room Quad cane at night or if tired in room (otherwise walks independently) Does all ADLs independently She doesn't report any "off" phenomenon Still does [employer]  Appetite seems "fine" Prefers more fruit and vegetables---she has noted this to the staff Weight is holding   No chest pain Notes some DOE--if walks fast around the swimming pool Tries to use machine for exercise (arms and legs) daily No dizziness  Notices her heart "beating" at times---but no true palpitations  Mild hip pain Some in  Ongoing left arm/shoulder problems--wonders about reevaluation by PT  Current Outpatient Medications on File Prior to Visit  Medication Sig Dispense Refill  . carbidopa-levodopa (SINEMET IR) 25-100 MG tablet Take 1 tablet by mouth 3 (three) times daily.    . dorzolamide-timolol (COSOPT) 22.3-6.8 MG/ML ophthalmic solution Place 1 drop into both eyes 2 (two) times daily.    . famotidine (PEPCID) 20 MG tablet Take 1 tablet (20 mg total) by mouth 2 (two) times daily. (Patient taking differently: Take 20 mg by mouth at bedtime. ) 60 tablet 0  . Multiple Vitamins-Minerals (MULTIVITAMIN WITH MINERALS) tablet Take 1 tablet by mouth daily.    . TRAVATAN Z 0.004 % SOLN ophthalmic solution Place 1 drop into both eyes at bedtime.    . Vitamin D, Ergocalciferol, (DRISDOL) 50000 units CAPS capsule Take 1 capsule (50,000 Units total) by mouth every 30 (thirty) days. 1 capsule 11   No current facility-administered medications on file prior to visit.     Allergies  Allergen Reactions  . Ibuprofen Swelling  . Mushroom Extract Complex Nausea And Vomiting  . Valium [Diazepam] Other (See  Comments)    Unknown   . Shellfish Allergy Rash    Past Medical History:  Diagnosis Date  . Essential hypertension, benign   . Generalized osteoarthritis of multiple sites    mild  . GERD (gastroesophageal reflux disease)   . Glaucoma   . Parkinson disease [facility](HCC)     Past Surgical History:  Procedure Laterality Date  . VAGINAL DELIVERY     twice    History reviewed. No pertinent family history.  Social History   Socioeconomic History  . Marital status: Widowed    Spouse name: Not on file  . Number of children: 2  . Years of education: Not on file  . Highest education level: Not on file  Occupational History  . Occupation: Production designer, theatre/television/filmAdministrator    Comment: [employer]  Social Needs  . Financial resource strain: Not on file  . Food insecurity:    Worry: Not on file    Inability: Not on file  . Transportation needs:    Medical: Not on file    Non-medical: Not on file  Tobacco Use  . Smoking status: Never Smoker  . Smokeless tobacco: Never Used  Substance and Sexual Activity  . Alcohol use: No  . Drug use: No  . Sexual activity: Not Currently  Lifestyle  . Physical activity:    Days per week: Not on file    Minutes per session: Not on file  . Stress: Not on file  Relationships  . Social connections:    Talks on  phone: Not on file    Gets together: Not on file    Attends religious service: Not on file    Active member of club or organization: Not on file    Attends meetings of clubs or organizations: Not on file    Relationship status: Not on file  . Intimate partner violence:    Fear of current or ex partner: Not on file    Emotionally abused: Not on file    Physically abused: Not on file    Forced sexual activity: Not on file  Other Topics Concern  . Not on file  Social History Narrative   No living will   Would want daughter and son to make health care decisions for her   Would accept resuscitation--but no prolonged ventilation   Would not want  extended tube feeds--would accept short term to determine prognosis   Review of Systems Sleeps okay Some post nasal drip at night Nocturia x 1---- voids okay in day Recent eye exam---vision stable Bowels are fine. No blood  Occasional skin irritation---cream helps    Objective:   Physical Exam  Neck: No thyromegaly present.  Cardiovascular: Normal rate, regular rhythm and normal heart sounds. Exam reveals no gallop.  No murmur heard. Respiratory: Effort normal and breath sounds normal. No respiratory distress. She has no wheezes. She has no rales.  GI: Soft. There is no abdominal tenderness.  Musculoskeletal:     Comments: Trace calf edema  Lymphadenopathy:    She has no cervical adenopathy.  Neurological:  Mild resting tremor Mild bradykinesia Gait slow but not particularly shuffling  Skin: No rash noted. No erythema.  Psychiatric: She has a normal mood and affect. Her behavior is normal.           Assessment & Plan:

## 2018-07-10 NOTE — Assessment & Plan Note (Signed)
Okay without meds  BP Readings from Last 3 Encounters:  07/10/18 122/66  03/28/18 (!) 159/98  01/02/18 (!) 153/94

## 2018-07-24 ENCOUNTER — Other Ambulatory Visit: Payer: Self-pay

## 2018-07-24 ENCOUNTER — Emergency Department
Admission: EM | Admit: 2018-07-24 | Discharge: 2018-07-24 | Disposition: A | Discharge status: Home / Self Care | Payer: Medicare Other | Attending: Emergency Medicine | Admitting: Emergency Medicine

## 2018-07-24 ENCOUNTER — Emergency Department: Payer: Medicare Other

## 2018-07-24 DIAGNOSIS — S299XXA Unspecified injury of thorax, initial encounter: Secondary | ICD-10-CM | POA: Diagnosis not present

## 2018-07-24 DIAGNOSIS — G20 Parkinson's disease: Secondary | ICD-10-CM | POA: Insufficient documentation

## 2018-07-24 DIAGNOSIS — Y998 Other external cause status: Secondary | ICD-10-CM | POA: Diagnosis not present

## 2018-07-24 DIAGNOSIS — R918 Other nonspecific abnormal finding of lung field: Secondary | ICD-10-CM | POA: Diagnosis not present

## 2018-07-24 DIAGNOSIS — M25552 Pain in left hip: Secondary | ICD-10-CM | POA: Diagnosis not present

## 2018-07-24 DIAGNOSIS — Z79899 Other long term (current) drug therapy: Secondary | ICD-10-CM | POA: Insufficient documentation

## 2018-07-24 DIAGNOSIS — W0110XA Fall on same level from slipping, tripping and stumbling with subsequent striking against unspecified object, initial encounter: Secondary | ICD-10-CM | POA: Diagnosis not present

## 2018-07-24 DIAGNOSIS — R079 Chest pain, unspecified: Secondary | ICD-10-CM | POA: Diagnosis not present

## 2018-07-24 DIAGNOSIS — S2242XA Multiple fractures of ribs, left side, initial encounter for closed fracture: Secondary | ICD-10-CM | POA: Insufficient documentation

## 2018-07-24 DIAGNOSIS — S79912A Unspecified injury of left hip, initial encounter: Secondary | ICD-10-CM | POA: Diagnosis not present

## 2018-07-24 DIAGNOSIS — S7002XA Contusion of left hip, initial encounter: Secondary | ICD-10-CM | POA: Insufficient documentation

## 2018-07-24 DIAGNOSIS — S59902A Unspecified injury of left elbow, initial encounter: Secondary | ICD-10-CM | POA: Diagnosis not present

## 2018-07-24 DIAGNOSIS — M7989 Other specified soft tissue disorders: Secondary | ICD-10-CM | POA: Diagnosis not present

## 2018-07-24 DIAGNOSIS — W19XXXA Unspecified fall, initial encounter: Secondary | ICD-10-CM

## 2018-07-24 DIAGNOSIS — M25522 Pain in left elbow: Secondary | ICD-10-CM | POA: Insufficient documentation

## 2018-07-24 DIAGNOSIS — E041 Nontoxic single thyroid nodule: Secondary | ICD-10-CM | POA: Diagnosis not present

## 2018-07-24 DIAGNOSIS — Y92129 Unspecified place in nursing home as the place of occurrence of the external cause: Secondary | ICD-10-CM | POA: Insufficient documentation

## 2018-07-24 DIAGNOSIS — I10 Essential (primary) hypertension: Secondary | ICD-10-CM | POA: Insufficient documentation

## 2018-07-24 DIAGNOSIS — Y9389 Activity, other specified: Secondary | ICD-10-CM | POA: Diagnosis not present

## 2018-07-24 LAB — URINALYSIS, COMPLETE (UACMP) WITH MICROSCOPIC
BACTERIA UA: NONE SEEN
Bilirubin Urine: NEGATIVE
GLUCOSE, UA: NEGATIVE mg/dL
Hgb urine dipstick: NEGATIVE
KETONES UR: NEGATIVE mg/dL
Leukocytes, UA: NEGATIVE
NITRITE: NEGATIVE
PROTEIN: NEGATIVE mg/dL
Specific Gravity, Urine: 1.012 (ref 1.005–1.030)
pH: 8 (ref 5.0–8.0)

## 2018-07-24 LAB — BASIC METABOLIC PANEL
Anion gap: 6 (ref 5–15)
BUN: 25 mg/dL — AB (ref 8–23)
CALCIUM: 9.2 mg/dL (ref 8.9–10.3)
CHLORIDE: 107 mmol/L (ref 98–111)
CO2: 25 mmol/L (ref 22–32)
CREATININE: 0.71 mg/dL (ref 0.44–1.00)
GFR calc Af Amer: >60 mL/min (ref >60)
GFR calc non Af Amer: >60 mL/min (ref >60)
GLUCOSE: 90 mg/dL (ref 70–99)
Potassium: 5.1 mmol/L (ref 3.5–5.1)
Sodium: 138 mmol/L (ref 135–145)

## 2018-07-24 LAB — CBC
HEMATOCRIT: 39.7 % (ref 36.0–46.0)
HEMOGLOBIN: 13.4 g/dL (ref 12.0–15.0)
MCH: 31.5 pg (ref 26.0–34.0)
MCHC: 33.8 g/dL (ref 30.0–36.0)
MCV: 93.4 fL (ref 80.0–100.0)
Platelets: 159 10*3/uL (ref 150–400)
RBC: 4.25 MIL/uL (ref 3.87–5.11)
RDW: 13.6 % (ref 11.5–15.5)
WBC: 5.4 10*3/uL (ref 4.0–10.5)
nRBC: 0 % (ref 0.0–0.2)

## 2018-07-24 MED ORDER — ONDANSETRON HCL 4 MG/2ML IJ SOLN
INTRAMUSCULAR | Status: AC
  Administered 2018-07-24: 4 mg via INTRAVENOUS
  Filled 2018-07-24: qty 2

## 2018-07-24 MED ORDER — FENTANYL CITRATE (PF) 100 MCG/2ML IJ SOLN
25.0000 ug | Freq: Once | INTRAMUSCULAR | Status: AC
Start: 2018-07-24 — End: 2018-07-24
  Administered 2018-07-24: 25 ug via INTRAVENOUS

## 2018-07-24 MED ORDER — OXYCODONE HCL 5 MG PO TABS
5.0000 mg | ORAL_TABLET | Freq: Once | ORAL | Status: AC
  Administered 2018-07-24: 5 mg via ORAL
  Filled 2018-07-24: qty 1

## 2018-07-24 MED ORDER — ACETAMINOPHEN 500 MG PO TABS
1000.0000 mg | ORAL_TABLET | Freq: Once | ORAL | Status: AC
  Administered 2018-07-24: 1000 mg via ORAL
  Filled 2018-07-24: qty 2

## 2018-07-24 MED ORDER — FENTANYL CITRATE (PF) 100 MCG/2ML IJ SOLN
INTRAMUSCULAR | Status: AC
  Administered 2018-07-24: 25 ug via INTRAVENOUS
  Filled 2018-07-24: qty 2

## 2018-07-24 MED ORDER — OXYCODONE HCL 5 MG PO TABS: 5.0000 mg | ORAL_TABLET | Freq: Four times a day (QID) | ORAL | 0 refills | Status: DC | PRN

## 2018-07-24 MED ORDER — ONDANSETRON HCL 4 MG/2ML IJ SOLN
4.0000 mg | Freq: Once | INTRAMUSCULAR | Status: AC
  Administered 2018-07-24: 4 mg via INTRAVENOUS

## 2018-07-24 NOTE — ED Notes (Signed)
Patient transported to CT 

## 2018-07-24 NOTE — Discharge Instructions (Signed)
Your imaging studies showed several incidental findings including a thyroid mass and lung mass. You need to be evaluated for these findings to rule out cancer. Please call your primary care doctor as soon as possible for follow up. If your doctor recommends follow up with oncology for evaluation, please make an appointment with Dr. Donneta Romberg listed above.   Pain control: Take tylenol 1000mg  every 8 hours. Take 5mg  of oxycodone every 6 hours for breakthrough pain. If you need the oxycodone make sure to take one senokot as well to prevent constipation.  Do not drink alcohol, drive or participate in any other potentially dangerous activities while taking this medication as it may make you sleepy. Do not take this medication with any other sedating medications, either prescription or over-the-counter.

## 2018-07-24 NOTE — ED Provider Notes (Signed)
-----------------------------------------   11:27 AM on 07/24/2018 -----------------------------------------   Blood pressure (!) 156/90, pulse 85, temperature 97.6 F (36.4 C), resp. rate 16, height 5\' 2"  (1.575 m), weight 49 kg, SpO2 95 %.  Assuming care from Dr. Manson PasseyBrown of Cheryl Cummings is a 78 y.o. female with a chief complaint of Fall .    Please refer to H&P by previous MD for further details.  Initial x-ray of the hip was negative for fracture however patient with significant pain and bruising therefore CT was done.  CT shows a flank hematoma but no evidence of fracture.  UA with no evidence of hematuria or infection.  Initial chest x-ray concerning for possible lung mass therefore CT was done by Dr. Manson PasseyBrown which shows 2 solid lesions in the right middle lobe and also a 1.6 cm left thyroid nodule.  Discussed these findings with patient and her son and recommended follow-up with PCP/oncology for evaluation for possible malignancy.  Patient was able to ambulate with appropriate pain control.  CT was also positive for left eighth and ninth rib fractures.  Patient was started on incentive spirometer.  Discussed admission for pain control versus discharge back to nursing home.  Patient prefers to go back up with pain control.  They are able to provide PT and nursing care for patient says that she is in a skilled nursing facility.  Oxycodone was prescribed for pain.  She was given an incentive spirometer.  Discussed close follow-up and standard return precautions.        [name], [place], MD 07/24/18 [phone]

## 2018-07-24 NOTE — ED Notes (Signed)
Pt assisted to the toilet in room. Pt was able to ambulate with the use of a Sheldon Amara.

## 2018-07-24 NOTE — ED Notes (Signed)
Pt given crackers and water. 

## 2018-07-24 NOTE — ED Provider Notes (Signed)
[facility] Emergency Department Provider Note ____________   First MD Initiated Contact with Patient 07/24/18 [phone]     (approximate)  I have reviewed the triage vital signs and the nursing notes.   HISTORY  Chief Complaint Fall    HPI Cheryl Cummings is a 78 y.o. female with below list of chronic medical conditions presents to the emergency department following accidental trip and fall with resultant left hip elbow and chest discomfort.  Patient states current pain score is 8 out of 10.  Patient denies any head injury no loss of consciousness.   Past Medical History:  Diagnosis Date  . Essential hypertension, benign   . Generalized osteoarthritis of multiple sites    mild  . GERD (gastroesophageal reflux disease)   . Glaucoma   . Parkinson disease [facility])     Patient Active Problem List   Diagnosis Date Noted  . Left shoulder pain 07/10/2018  . Kyphoscoliosis 01/02/2018  . Parkinson disease (HCC) 03/14/2017  . Malnutrition of mild degree (HCC) 03/14/2017  . Advance directive discussed with patient 03/14/2017  . GERD (gastroesophageal reflux disease)   . Glaucoma   . Essential hypertension, benign   . Generalized osteoarthritis of multiple sites     Past Surgical History:  Procedure Laterality Date  . VAGINAL DELIVERY     twice    Prior to Admission medications   Medication Sig Start Date End Date Taking? Authorizing Provider  carbidopa-levodopa (SINEMET IR) 25-100 MG tablet Take 1 tablet by mouth 3 (three) times daily. 08/14/16   [provider]  dorzolamide-timolol (COSOPT) 22.3-6.8 MG/ML ophthalmic solution Place 1 drop into both eyes 2 (two) times daily.    [provider]  famotidine (PEPCID) 20 MG tablet Take 1 tablet (20 mg total) by mouth 2 (two) times daily. Patient taking differently: Take 20 mg by mouth at bedtime.  09/06/16   Sharman Cheek, MD  Multiple Vitamins-Minerals (MULTIVITAMIN WITH MINERALS)  tablet Take 1 tablet by mouth daily.    [provider]  TRAVATAN Z 0.004 % SOLN ophthalmic solution Place 1 drop into both eyes at bedtime.    [provider]  Vitamin D, Ergocalciferol, (DRISDOL) 50000 units CAPS capsule Take 1 capsule (50,000 Units total) by mouth every 30 (thirty) days. 03/14/17   [name], MD    Allergies Ibuprofen; Mushroom extract complex; Valium [diazepam]; and Shellfish allergy  No family history on file.  Social History Social History   Tobacco Use  . Smoking status: Never Smoker  . Smokeless tobacco: Never Used  Substance Use Topics  . Alcohol use: No  . Drug use: No    Review of Systems Constitutional: No fever/chills Eyes: No visual changes. ENT: No sore throat. Cardiovascular: Denies chest pain. Respiratory: Denies shortness of breath. Gastrointestinal: No abdominal pain.  No nausea, no vomiting.  No diarrhea.  No constipation. Genitourinary: Negative for dysuria. Musculoskeletal: Positive for left hip chest and elbow pain. Integumentary: Negative for rash. Neurological: Negative for headaches, focal weakness or numbness.   ____________________________________________   PHYSICAL EXAM:  VITAL SIGNS: ED Triage Vitals  Enc Vitals Group     BP 07/24/18 0631 (!) 191/114     Pulse Rate 07/24/18 0631 87     Resp 07/24/18 0631 16     Temp 07/24/18 0631 97.6 F (36.4 C)     Temp src --      SpO2 07/24/18 0631 98 %     Weight 07/24/18 0634 49 kg (  108 lb)     Height 07/24/18 0634 1.575 m (5\' 2" )     Head Circumference --      Peak Flow --      Pain Score 07/24/18 0634 8     Pain Loc --      Pain Edu? --      Excl. in GC? --     Constitutional: Alert and oriented. Well appearing and in no acute distress. Eyes: Conjunctivae are normal. Head: Atraumatic. Mouth/Throat: Mucous membranes are moist.  Oropharynx non-erythematous. Neck: No stridor. No cervical spine tenderness to palpation. Chest: Pain to palpation  left chest wall Cardiovascular: Normal rate, regular rhythm. Good peripheral circulation. Grossly normal heart sounds. Respiratory: Normal respiratory effort.  No retractions. Lungs CTAB. Gastrointestinal: Soft and nontender. No distention.  Musculoskeletal: Left hip contusion and abrasion noted.  Pain with active and passive range of motion of the left hip. Neurologic:  Normal speech and language. No gross focal neurologic deficits are appreciated.  Skin: Abrasion noted to the left hip as well as right great toe. Psychiatric: Mood and affect are normal. Speech and behavior are normal.  ____________________________________________   LABS (all labs ordered are listed, but only abnormal results are displayed)  Labs Reviewed  CBC  BASIC METABOLIC PANEL   ____________________________________________  EKG ED ECG REPORT I, Bland N BROWN, the attending physician, personally viewed and interpreted this ECG.   Date: 07/24/2018  EKG Time: 6:32 AM  Rate: 85  Rhythm: Normal sinus rhythm  Axis: Normal  Intervals: Normal  ST&T Change: None  __________________  Procedures   ____________________________________________   INITIAL IMPRESSION / ASSESSMENT AND PLAN / ED COURSE  As part of my medical decision making, I reviewed the following data within the electronic MEDICAL RECORD NUMBER  78 year old female presenting with above-stated history and physical exam following accidental trip and fall.  Concern for possible hip fracture left rib fractures and left elbow fracture and as such appropriate x-rays were ordered.  Patient denied any head injury no loss of consciousness and as such CT scan not performed.  Patient's care transferred to Dr. Don Perking ____________________________________________  FINAL CLINICAL IMPRESSION(S) / ED DIAGNOSES  Final diagnoses:  Fall, initial encounter  Hematoma of left hip, initial encounter  Closed fracture of multiple ribs of left side, initial encounter   Lung mass  Thyroid nodule     MEDICATIONS GIVEN DURING THIS VISIT:  Medications - No data to display   ED Discharge Orders    None       Note:  This document was prepared using Dragon voice recognition software and may include unintentional dictation errors.    Darci Current, MD 07/25/18 [phone]

## 2018-07-24 NOTE — ED Notes (Signed)
[facility] is going to medical [facility].   

## 2018-07-24 NOTE — ED Triage Notes (Signed)
Pt tripped while coming from the bathroom. Pt has abrasion noted to her left forearm, right big toe, left side(rib area) and pt has swelling to left elbow and left hip area.

## 2018-07-24 NOTE — ED Notes (Signed)
Spoke with daughter in law for discharge. Daughter in law states she will be in contact with pt's son who will transport pt back to facility.

## 2018-08-01 ENCOUNTER — Ambulatory Visit: Payer: Medicare Other | Admitting: Internal Medicine

## 2018-08-01 VITALS — BP 126/82 | HR 81 | Temp 98.1°F | Resp 16 | Wt 111.6 lb

## 2018-08-01 DIAGNOSIS — S2242XD Multiple fractures of ribs, left side, subsequent encounter for fracture with routine healing: Secondary | ICD-10-CM

## 2018-08-01 DIAGNOSIS — Y92129 Unspecified place in nursing home as the place of occurrence of the external cause: Secondary | ICD-10-CM

## 2018-08-01 DIAGNOSIS — S301XXD Contusion of abdominal wall, subsequent encounter: Secondary | ICD-10-CM

## 2018-08-01 DIAGNOSIS — R278 Other lack of coordination: Secondary | ICD-10-CM | POA: Diagnosis not present

## 2018-08-01 DIAGNOSIS — R4189 Other symptoms and signs involving cognitive functions and awareness: Secondary | ICD-10-CM | POA: Diagnosis not present

## 2018-08-01 DIAGNOSIS — W19XXXD Unspecified fall, subsequent encounter: Secondary | ICD-10-CM

## 2018-08-01 DIAGNOSIS — R2689 Other abnormalities of gait and mobility: Secondary | ICD-10-CM | POA: Diagnosis not present

## 2018-08-01 DIAGNOSIS — R911 Solitary pulmonary nodule: Secondary | ICD-10-CM | POA: Diagnosis not present

## 2018-08-01 DIAGNOSIS — M6281 Muscle weakness (generalized): Secondary | ICD-10-CM | POA: Diagnosis not present

## 2018-08-01 DIAGNOSIS — Z741 Need for assistance with personal care: Secondary | ICD-10-CM | POA: Diagnosis not present

## 2018-08-01 DIAGNOSIS — E041 Nontoxic single thyroid nodule: Secondary | ICD-10-CM | POA: Diagnosis not present

## 2018-08-01 DIAGNOSIS — M25552 Pain in left hip: Secondary | ICD-10-CM

## 2018-08-03 ENCOUNTER — Encounter: Payer: Self-pay | Admitting: Internal Medicine

## 2018-08-03 NOTE — Patient Instructions (Signed)
Thyroid Nodule    A thyroid nodule is an isolated growth of thyroid cells that forms a lump in your thyroid gland. The thyroid gland is a butterfly-shaped gland. It is found in the lower front of your neck. This gland sends chemical messengers (hormones) through your blood to all parts of your body. These hormones are important in regulating your body temperature and helping your body to use energy. Thyroid nodules are common. Most are not cancerous (are benign). You may have one nodule or several nodules.  Different types of thyroid nodules include:  · Nodules that grow and fill with fluid (thyroid cysts).  · Nodules that produce too much thyroid hormone (hot nodules or hyperthyroid).  · Nodules that produce no thyroid hormone (cold nodules or hypothyroid).  · Nodules that form from cancer cells (thyroid cancers).  What are the causes?  Usually, the cause of this condition is not known.  What increases the risk?  Factors that make this condition more likely to develop include:  · Increasing age. Thyroid nodules become more common in people who are older than 79 years of age.  · Gender.  ? Benign thyroid nodules are more common in women.  ? Cancerous (malignant) thyroid nodules are more common in men.  · A family history that includes:  ? Thyroid nodules.  ? Pheochromocytoma.  ? Thyroid carcinoma.  ? Hyperparathyroidism.  · Certain kinds of thyroid diseases, such as Hashimoto thyroiditis.  · Lack of iodine.  · A history of head and neck radiation, such as from X-rays.  What are the signs or symptoms?  It is common for this condition to cause no symptoms. If you have symptoms, they may include:  · A lump in your lower neck.  · Feeling a lump or tickle in your throat.  · Pain in your neck, jaw, or ear.  · Having trouble swallowing.  Hot nodules may cause symptoms that include:  · Weight loss.  · Warm, flushed skin.  · Feeling hot.  · Feeling nervous.  · A racing heartbeat.  Cold nodules may cause symptoms that  include:  · Weight gain.  · Dry skin.  · Brittle hair. This may also occur with hair loss.  · Feeling cold.  · Fatigue.  Thyroid cancer nodules may cause symptoms that include:  · Hard nodules that feel stuck to the thyroid gland.  · Hoarseness.  · Lumps in the glands near your thyroid (lymph nodes).  How is this diagnosed?  A thyroid nodule may be felt by your health care provider during a physical exam. This condition may also be diagnosed based on your symptoms. You may also have tests, including:  · An ultrasound. This may be done to confirm the diagnosis.  · A biopsy. This involves taking a sample from the nodule and looking at it under a microscope to see if the nodule is benign.  · Blood tests to make sure that your thyroid is working properly.  · Imaging tests such as MRI or CT scan may be done if:  ? Your nodule is large.  ? Your nodule is blocking your airway.  ? Cancer is suspected.  How is this treated?  Treatment depends on the cause and size of your nodule or nodules. If the nodule is benign, treatment may not be necessary. Your health care provider may monitor the nodule to see if it goes away without treatment. If the nodule continues to grow, is cancerous, or does not go away:  ·   It may need to be drained with a needle.  · It may need to be removed with surgery.  If you have surgery, part or all of your thyroid gland may need to be removed as well.  Follow these instructions at home:  · Pay attention to any changes in your nodule.  · Take over-the-counter and prescription medicines only as told by your health care provider.  · Keep all follow-up visits as told by your health care provider. This is important.  Contact a health care provider if:  · Your voice changes.  · You have trouble swallowing.  · You have pain in your neck, ear, or jaw that is getting worse.  · Your nodule gets bigger.  · Your nodule starts to make it harder for you to breathe.  Get help right away if:  · You have a sudden  fever.  · You feel very weak.  · Your muscles look like they are shrinking (muscle wasting).  · You have mood swings.  · You feel very restless.  · You feel confused.  · You are seeing or hearing things that other people do not see or hear (having hallucinations).  · You feel suddenly nauseous or throw up.  · You suddenly have diarrhea.  · You have chest pain.  · There is a loss of consciousness.  This information is not intended to replace advice given to you by your health care provider. Make sure you discuss any questions you have with your health care provider.  Document Released: 05/25/2004 Document Revised: 03/04/2016 Document Reviewed: 10/13/2014  Elsevier Interactive Patient Education © 2019 [employer]

## 2018-08-03 NOTE — Progress Notes (Signed)
Subjective:    Patient ID: Cheryl Cummings, female    DOB: 07-08-1941, 78 y.o.   MRN: [id number]  HPI  Asked to see resident in [address] Went to ER 1/9 s/p a fall. Labs and ECG were unremarkable.  Xray of the left elbow was negative. Xray of the left ribs showed 8th and 9th nondisplaced rib fractures. Xray hip was negative for fracture. CT hip showed postraumatic left hip hematoma. CT chest showed:  IMPRESSION: Two rounded solid lesions are noted in the right middle lobe, withthe largest measuring 2.1 cm. PET scan is recommended evaluate for possible neoplasm or malignancy. 1.6 cm left thyroid nodule is noted. Thyroid ultrasound is recommended for further evaluation. Possible nondisplaced fractures are seen involving the left eighth and ninth ribs.  She was discharged back to [facility] with Oxycodone for pain and an incentive spirometer to help prevent pneumonia.  Since discharge, she reports she still has pain and bruising to her left ribs and hip. She is working with PT/OT. The pain is worse with movement. She has Oxycodone, Tylenol and Naproxen prn to take when she thinks she needs it.   Upon further discussion about the findings on her CT scan, she would be interested in further evaluation. Discussed with her son, who is also agreeable. Resident denies family history of lung or thyroid cancer. She never smoked and never worked in a factory.   Review of Systems      Past Medical History:  Diagnosis Date  . Essential hypertension, benign   . Generalized osteoarthritis of multiple sites    mild  . GERD (gastroesophageal reflux disease)   . Glaucoma   . Parkinson disease [facility])     Current Outpatient Medications  Medication Sig Dispense Refill  . carbidopa-levodopa (SINEMET IR) 25-100 MG tablet Take 1 tablet by mouth 3 (three) times daily.    . dorzolamide-timolol (COSOPT) 22.3-6.8 MG/ML ophthalmic solution Place 1 drop into both eyes 2 (two) times daily.    . famotidine (PEPCID)  20 MG tablet Take 1 tablet (20 mg total) by mouth 2 (two) times daily. (Patient taking differently: Take 20 mg by mouth daily as needed for heartburn or indigestion. ) 60 tablet 0  . Multiple Vitamins-Minerals (MULTIVITAMIN WITH MINERALS) tablet Take 1 tablet by mouth daily.    Marland Kitchen oxyCODONE (ROXICODONE) 5 MG immediate release tablet Take 1 tablet (5 mg total) by mouth every 6 (six) hours as needed for moderate pain or severe pain. 12 tablet 0  . TRAVATAN Z 0.004 % SOLN ophthalmic solution Place 1 drop into both eyes at bedtime.    . Vitamin D, Ergocalciferol, (DRISDOL) 50000 units CAPS capsule Take 1 capsule (50,000 Units total) by mouth every 30 (thirty) days. (Patient taking differently: Take 50,000 Units by mouth. Every 28 days) 1 capsule 11  . acetaminophen (TYLENOL) 325 MG tablet Take 325-650 mg by mouth every 4 (four) hours as needed for mild pain or fever.    Marland Kitchen alum & mag hydroxide-simeth (MINTOX) 200-200-20 MG/5ML suspension Take 30 mLs by mouth every 4 (four) hours as needed for indigestion, heartburn or flatulence.    . bismuth subsalicylate (KAOPECTATE) 262 MG/15ML suspension Take 10 mLs by mouth 3 (three) times daily as needed for diarrhea or loose stools.    . carbamide peroxide (DEBROX) 6.5 % OTIC solution Place 5 drops into both ears daily as needed (cerumen impaction).    Marland Kitchen dextromethorphan-guaiFENesin (TUSSIN DM) 10-100 MG/5ML liquid Take 10 mLs by mouth every 4 (four)  hours as needed for cough.    . diphenhydrAMINE (BENADRYL) 25 MG tablet Take 25 mg by mouth every 4 (four) hours as needed for itching or allergies.    . magnesium hydroxide (MILK OF MAGNESIA) 400 MG/5ML suspension Take 30 mLs by mouth daily as needed for mild constipation or moderate constipation.    . naproxen (NAPROSYN) 250 MG tablet Take 250 mg by mouth 2 (two) times daily as needed for mild pain or moderate pain.    Marland Kitchen. nystatin (MYCOSTATIN/NYSTOP) powder Apply 1 g topically 2 (two) times daily as needed (rash under the  breasts).    . ondansetron (ZOFRAN) 4 MG tablet Take 4 mg by mouth 3 (three) times daily as needed for nausea or vomiting.     No current facility-administered medications for this visit.     Allergies  Allergen Reactions  . Ibuprofen Swelling  . Mushroom Extract Complex Nausea And Vomiting  . Valium [Diazepam] Other (See Comments)    Unknown   . Shellfish Allergy Rash    History reviewed. No pertinent family history.  Social History   Socioeconomic History  . Marital status: Widowed    Spouse name: Not on file  . Number of children: 2  . Years of education: Not on file  . Highest education level: Not on file  Occupational History  . Occupation: Production designer, theatre/television/filmAdministrator    Comment: [employer]  Social Needs  . Financial resource strain: Not on file  . Food insecurity:    Worry: Not on file    Inability: Not on file  . Transportation needs:    Medical: Not on file    Non-medical: Not on file  Tobacco Use  . Smoking status: Never Smoker  . Smokeless tobacco: Never Used  Substance and Sexual Activity  . Alcohol use: No  . Drug use: No  . Sexual activity: Not Currently  Lifestyle  . Physical activity:    Days per week: Not on file    Minutes per session: Not on file  . Stress: Not on file  Relationships  . Social connections:    Talks on phone: Not on file    Gets together: Not on file    Attends religious service: Not on file    Active member of club or organization: Not on file    Attends meetings of clubs or organizations: Not on file    Relationship status: Not on file  . Intimate partner violence:    Fear of current or ex partner: Not on file    Emotionally abused: Not on file    Physically abused: Not on file    Forced sexual activity: Not on file  Other Topics Concern  . Not on file  Social History Narrative   No living will   Would want daughter and son to make health care decisions for her   Would accept resuscitation--but no prolonged ventilation    Would not want extended tube feeds--would accept short term to determine prognosis     Constitutional: Denies fever, malaise, fatigue, headache or abrupt weight changes. Respiratory: Denies difficulty breathing, shortness of breath, cough or sputum production.   Cardiovascular: Denies chest pain, chest tightness, palpitations or swelling in the hands or feet.  Gastrointestinal: Denies abdominal pain, bloating, constipation, diarrhea or blood in the stool.  GU: Denies urgency, frequency, pain with urination, burning sensation, blood in urine, odor or discharge. Musculoskeletal: Pt reports left side rib pain, left flank pain, left hip pain. Skin: Pt repors  bruising to left side of ribs, left flank and left hip. Denies redness, rashes, lesions or ulcercations.    No other specific complaints in a complete review of systems (except as listed in HPI above).  Objective:   Physical Exam  BP 126/82   Pulse 81   Temp 98.1 F (36.7 C)   Resp 16   Wt 111 lb 9.6 oz (50.6 kg)   BMI 20.41 kg/m  Wt Readings from Last 3 Encounters:  08/03/18 111 lb 9.6 oz (50.6 kg)  07/24/18 108 lb (49 kg)  07/10/18 115 lb 3.2 oz (52.3 kg)    General: Appears her stated age. Skin: Bruising of left side of ribs, left hip. Hematoma to left flank. Pulmonary/Chest: Normal effort and positive vesicular breath sounds. No respiratory distress. No wheezes, rales or ronchi noted.  Abdomen: Soft and nontender. Normal bowel sounds. No distention or masses noted.  Musculoskeletal: Pain with palpation of the left lateral ribs, and left hip. Bradykinesic, gait slow with use of assistive device.  Neurological: Alert and oriented.    BMET    Component Value Date/Time   NA 138 07/24/2018 0639   NA 142 11/01/2013 1403   K 5.1 07/24/2018 0639   K 3.7 11/01/2013 1403   CL 107 07/24/2018 0639   CL 103 11/01/2013 1403   CO2 25 07/24/2018 0639   CO2 32 11/01/2013 1403   GLUCOSE 90 07/24/2018 0639   GLUCOSE 106 (H)  11/01/2013 1403   BUN 25 (H) 07/24/2018 0639   BUN 18 11/01/2013 1403   CREATININE 0.71 07/24/2018 0639   CREATININE 0.88 11/01/2013 1403   CALCIUM 9.2 07/24/2018 0639   CALCIUM 9.3 11/01/2013 1403   GFRNONAA >60 07/24/2018 0639   GFRNONAA >60 11/01/2013 1403   GFRAA >60 07/24/2018 0639   GFRAA >60 11/01/2013 1403    Lipid Panel  No results found for: CHOL, TRIG, HDL, CHOLHDL, VLDL, LDLCALC  CBC    Component Value Date/Time   WBC 5.4 07/24/2018 0639   RBC 4.25 07/24/2018 0639   HGB 13.4 07/24/2018 0639   HGB 14.6 11/01/2013 1403   HCT 39.7 07/24/2018 0639   HCT 42.6 11/01/2013 1403   PLT 159 07/24/2018 0639   PLT 185 11/01/2013 1403   MCV 93.4 07/24/2018 0639   MCV 94 11/01/2013 1403   MCH 31.5 07/24/2018 0639   MCHC 33.8 07/24/2018 0639   RDW 13.6 07/24/2018 0639   RDW 13.5 11/01/2013 1403   LYMPHSABS 0.9 (L) 09/06/2016 0334   LYMPHSABS 1.3 11/01/2013 1403   MONOABS 0.6 09/06/2016 0334   MONOABS 0.4 11/01/2013 1403   EOSABS 0.0 09/06/2016 0334   EOSABS 0.0 11/01/2013 1403   BASOSABS 0.0 09/06/2016 0334   BASOSABS 0.0 11/01/2013 1403    Hgb A1C No results found for: HGBA1C          Assessment & Plan:   ER Follow Up Fall at Nursing Facility, Left Side Rib Fractures, Hematoma Left Flank, Left Hip Pain, Thyroid Nodule and Lung Nodule:  ER notes, labs and imaging reviewed. Discussed all findings with resident and son Both agreeable for further workup Will discuss with Dr. Alphonsus SiasLetvak appropriate imaging, (radiology recommends thyroid ultrasound and PET scan) Advised resident and son that we would discuss the findings of the imaging once received Advised her to try to use Tylenol or Naproxen as needed for pain RX for Lidoderm patches Continue to work with PT/OT Use IS to prevent pneumonia  Will reassess as needed Nicki Reaperegina Baity, NP

## 2018-08-04 DIAGNOSIS — R2689 Other abnormalities of gait and mobility: Secondary | ICD-10-CM | POA: Diagnosis not present

## 2018-08-04 DIAGNOSIS — Z741 Need for assistance with personal care: Secondary | ICD-10-CM | POA: Diagnosis not present

## 2018-08-04 DIAGNOSIS — R278 Other lack of coordination: Secondary | ICD-10-CM | POA: Diagnosis not present

## 2018-08-04 DIAGNOSIS — R4189 Other symptoms and signs involving cognitive functions and awareness: Secondary | ICD-10-CM | POA: Diagnosis not present

## 2018-08-04 DIAGNOSIS — M6281 Muscle weakness (generalized): Secondary | ICD-10-CM | POA: Diagnosis not present

## 2018-08-05 ENCOUNTER — Other Ambulatory Visit: Payer: Self-pay | Admitting: Internal Medicine

## 2018-08-05 DIAGNOSIS — M6281 Muscle weakness (generalized): Secondary | ICD-10-CM | POA: Diagnosis not present

## 2018-08-05 DIAGNOSIS — E041 Nontoxic single thyroid nodule: Secondary | ICD-10-CM

## 2018-08-05 DIAGNOSIS — R278 Other lack of coordination: Secondary | ICD-10-CM | POA: Diagnosis not present

## 2018-08-05 DIAGNOSIS — Z741 Need for assistance with personal care: Secondary | ICD-10-CM | POA: Diagnosis not present

## 2018-08-05 DIAGNOSIS — R4189 Other symptoms and signs involving cognitive functions and awareness: Secondary | ICD-10-CM | POA: Diagnosis not present

## 2018-08-05 DIAGNOSIS — R2689 Other abnormalities of gait and mobility: Secondary | ICD-10-CM | POA: Diagnosis not present

## 2018-08-05 DIAGNOSIS — R911 Solitary pulmonary nodule: Secondary | ICD-10-CM

## 2018-08-05 NOTE — Addendum Note (Signed)
Addended by: Lorre Munroe on: 08/05/2018 09:56 AM   Modules accepted: Orders

## 2018-08-06 DIAGNOSIS — R2689 Other abnormalities of gait and mobility: Secondary | ICD-10-CM | POA: Diagnosis not present

## 2018-08-06 DIAGNOSIS — Z741 Need for assistance with personal care: Secondary | ICD-10-CM | POA: Diagnosis not present

## 2018-08-06 DIAGNOSIS — R4189 Other symptoms and signs involving cognitive functions and awareness: Secondary | ICD-10-CM | POA: Diagnosis not present

## 2018-08-06 DIAGNOSIS — M6281 Muscle weakness (generalized): Secondary | ICD-10-CM | POA: Diagnosis not present

## 2018-08-06 DIAGNOSIS — R278 Other lack of coordination: Secondary | ICD-10-CM | POA: Diagnosis not present

## 2018-08-07 DIAGNOSIS — M6281 Muscle weakness (generalized): Secondary | ICD-10-CM | POA: Diagnosis not present

## 2018-08-07 DIAGNOSIS — Z741 Need for assistance with personal care: Secondary | ICD-10-CM | POA: Diagnosis not present

## 2018-08-07 DIAGNOSIS — R4189 Other symptoms and signs involving cognitive functions and awareness: Secondary | ICD-10-CM | POA: Diagnosis not present

## 2018-08-07 DIAGNOSIS — R2689 Other abnormalities of gait and mobility: Secondary | ICD-10-CM | POA: Diagnosis not present

## 2018-08-07 DIAGNOSIS — R278 Other lack of coordination: Secondary | ICD-10-CM | POA: Diagnosis not present

## 2018-08-08 ENCOUNTER — Other Ambulatory Visit: Payer: Self-pay | Admitting: Internal Medicine

## 2018-08-08 ENCOUNTER — Ambulatory Visit
Admission: RE | Admit: 2018-08-08 | Discharge: 2018-08-08 | Disposition: A | Discharge status: Home / Self Care | Payer: Medicare Other | Source: Ambulatory Visit | Attending: Internal Medicine | Admitting: Internal Medicine

## 2018-08-08 ENCOUNTER — Other Ambulatory Visit (HOSPITAL_COMMUNITY): Payer: Self-pay | Admitting: Internal Medicine

## 2018-08-08 DIAGNOSIS — E041 Nontoxic single thyroid nodule: Secondary | ICD-10-CM | POA: Diagnosis not present

## 2018-08-08 DIAGNOSIS — R911 Solitary pulmonary nodule: Secondary | ICD-10-CM

## 2018-08-08 LAB — GLUCOSE, CAPILLARY: Glucose-Capillary: 69 mg/dL — ABNORMAL LOW (ref 70–99)

## 2018-08-08 MED ORDER — FLUDEOXYGLUCOSE F - 18 (FDG) INJECTION
6.3800 | Freq: Once | INTRAVENOUS | Status: AC | PRN
  Administered 2018-08-08: 6.38 via INTRAVENOUS

## 2018-08-11 DIAGNOSIS — M6281 Muscle weakness (generalized): Secondary | ICD-10-CM | POA: Diagnosis not present

## 2018-08-11 DIAGNOSIS — R278 Other lack of coordination: Secondary | ICD-10-CM | POA: Diagnosis not present

## 2018-08-11 DIAGNOSIS — Z741 Need for assistance with personal care: Secondary | ICD-10-CM | POA: Diagnosis not present

## 2018-08-11 DIAGNOSIS — R2689 Other abnormalities of gait and mobility: Secondary | ICD-10-CM | POA: Diagnosis not present

## 2018-08-11 DIAGNOSIS — R4189 Other symptoms and signs involving cognitive functions and awareness: Secondary | ICD-10-CM | POA: Diagnosis not present

## 2018-08-12 DIAGNOSIS — R2689 Other abnormalities of gait and mobility: Secondary | ICD-10-CM | POA: Diagnosis not present

## 2018-08-12 DIAGNOSIS — Z741 Need for assistance with personal care: Secondary | ICD-10-CM | POA: Diagnosis not present

## 2018-08-12 DIAGNOSIS — R278 Other lack of coordination: Secondary | ICD-10-CM | POA: Diagnosis not present

## 2018-08-12 DIAGNOSIS — R4189 Other symptoms and signs involving cognitive functions and awareness: Secondary | ICD-10-CM | POA: Diagnosis not present

## 2018-08-12 DIAGNOSIS — M6281 Muscle weakness (generalized): Secondary | ICD-10-CM | POA: Diagnosis not present

## 2018-08-13 DIAGNOSIS — R4189 Other symptoms and signs involving cognitive functions and awareness: Secondary | ICD-10-CM | POA: Diagnosis not present

## 2018-08-13 DIAGNOSIS — R2689 Other abnormalities of gait and mobility: Secondary | ICD-10-CM | POA: Diagnosis not present

## 2018-08-13 DIAGNOSIS — Z741 Need for assistance with personal care: Secondary | ICD-10-CM | POA: Diagnosis not present

## 2018-08-13 DIAGNOSIS — R278 Other lack of coordination: Secondary | ICD-10-CM | POA: Diagnosis not present

## 2018-08-13 DIAGNOSIS — M6281 Muscle weakness (generalized): Secondary | ICD-10-CM | POA: Diagnosis not present

## 2018-08-14 DIAGNOSIS — R2689 Other abnormalities of gait and mobility: Secondary | ICD-10-CM | POA: Diagnosis not present

## 2018-08-14 DIAGNOSIS — Z741 Need for assistance with personal care: Secondary | ICD-10-CM | POA: Diagnosis not present

## 2018-08-14 DIAGNOSIS — M6281 Muscle weakness (generalized): Secondary | ICD-10-CM | POA: Diagnosis not present

## 2018-08-14 DIAGNOSIS — R278 Other lack of coordination: Secondary | ICD-10-CM | POA: Diagnosis not present

## 2018-08-14 DIAGNOSIS — R4189 Other symptoms and signs involving cognitive functions and awareness: Secondary | ICD-10-CM | POA: Diagnosis not present

## 2018-08-15 DIAGNOSIS — Z741 Need for assistance with personal care: Secondary | ICD-10-CM | POA: Diagnosis not present

## 2018-08-15 DIAGNOSIS — M6281 Muscle weakness (generalized): Secondary | ICD-10-CM | POA: Diagnosis not present

## 2018-08-15 DIAGNOSIS — R2689 Other abnormalities of gait and mobility: Secondary | ICD-10-CM | POA: Diagnosis not present

## 2018-08-15 DIAGNOSIS — R278 Other lack of coordination: Secondary | ICD-10-CM | POA: Diagnosis not present

## 2018-08-15 DIAGNOSIS — R4189 Other symptoms and signs involving cognitive functions and awareness: Secondary | ICD-10-CM | POA: Diagnosis not present

## 2018-08-18 DIAGNOSIS — Z741 Need for assistance with personal care: Secondary | ICD-10-CM | POA: Diagnosis not present

## 2018-08-18 DIAGNOSIS — R4189 Other symptoms and signs involving cognitive functions and awareness: Secondary | ICD-10-CM | POA: Diagnosis not present

## 2018-08-18 DIAGNOSIS — R278 Other lack of coordination: Secondary | ICD-10-CM | POA: Diagnosis not present

## 2018-08-19 DIAGNOSIS — R4189 Other symptoms and signs involving cognitive functions and awareness: Secondary | ICD-10-CM | POA: Diagnosis not present

## 2018-08-19 DIAGNOSIS — Z741 Need for assistance with personal care: Secondary | ICD-10-CM | POA: Diagnosis not present

## 2018-08-19 DIAGNOSIS — R278 Other lack of coordination: Secondary | ICD-10-CM | POA: Diagnosis not present

## 2018-10-08 ENCOUNTER — Encounter: Payer: Self-pay | Admitting: Internal Medicine

## 2018-10-08 ENCOUNTER — Ambulatory Visit: Payer: Medicare Other | Admitting: Internal Medicine

## 2018-10-08 ENCOUNTER — Other Ambulatory Visit: Payer: Self-pay

## 2018-10-08 DIAGNOSIS — M159 Polyosteoarthritis, unspecified: Secondary | ICD-10-CM | POA: Diagnosis not present

## 2018-10-08 DIAGNOSIS — K219 Gastro-esophageal reflux disease without esophagitis: Secondary | ICD-10-CM

## 2018-10-08 DIAGNOSIS — G20 Parkinson's disease: Secondary | ICD-10-CM

## 2018-10-08 NOTE — Progress Notes (Signed)
Subjective:    Patient ID: Cheryl Cummings, female    DOB: Jul 26, 1940, 78 y.o.   MRN: [id number]  HPI  Saw resident in [address] RN reports multiple falls in the last 3 weeks, no injuries Otherwise, no concerns Sleeps well, occasionally woken up by dreams at night. Walks with 4 prong cane, independent with ADL's Appetite good, weight stable Bowels okay, some urinary incontinence Denies pain, reflux or SOB  GERD: She denies issues on Famotidine.  Parkinson's: Frequent falls, some bradykinesia and resting tremor. She takes Sinemet as prescribed.   OA: Pain controlled with prn Tylenol  Review of Systems      Past Medical History:  Diagnosis Date  . Essential hypertension, benign   . Generalized osteoarthritis of multiple sites    mild  . GERD (gastroesophageal reflux disease)   . Glaucoma   . Parkinson disease [facility])     Current Outpatient Medications  Medication Sig Dispense Refill  . acetaminophen (TYLENOL) 325 MG tablet Take 325-650 mg by mouth every 4 (four) hours as needed for mild pain or fever.    Marland Kitchen alum & mag hydroxide-simeth (MINTOX) 200-200-20 MG/5ML suspension Take 30 mLs by mouth every 4 (four) hours as needed for indigestion, heartburn or flatulence.    . bismuth subsalicylate (KAOPECTATE) 262 MG/15ML suspension Take 10 mLs by mouth 3 (three) times daily as needed for diarrhea or loose stools.    . carbamide peroxide (DEBROX) 6.5 % OTIC solution Place 5 drops into both ears daily as needed (cerumen impaction).    . carbidopa-levodopa (SINEMET IR) 25-100 MG tablet Take 1 tablet by mouth 3 (three) times daily.    Marland Kitchen dextromethorphan-guaiFENesin (TUSSIN DM) 10-100 MG/5ML liquid Take 10 mLs by mouth every 4 (four) hours as needed for cough.    . diphenhydrAMINE (BENADRYL) 25 MG tablet Take 25 mg by mouth every 4 (four) hours as needed for itching or allergies.    Marland Kitchen dorzolamide-timolol (COSOPT) 22.3-6.8 MG/ML ophthalmic solution Place 1 drop into both eyes 2 (two)  times daily.    . famotidine (PEPCID) 20 MG tablet Take 1 tablet (20 mg total) by mouth 2 (two) times daily. (Patient taking differently: Take 20 mg by mouth daily as needed for heartburn or indigestion. ) 60 tablet 0  . magnesium hydroxide (MILK OF MAGNESIA) 400 MG/5ML suspension Take 30 mLs by mouth daily as needed for mild constipation or moderate constipation.    . Multiple Vitamins-Minerals (MULTIVITAMIN WITH MINERALS) tablet Take 1 tablet by mouth daily.    . naproxen (NAPROSYN) 250 MG tablet Take 250 mg by mouth 2 (two) times daily as needed for mild pain or moderate pain.    Marland Kitchen nystatin (MYCOSTATIN/NYSTOP) powder Apply 1 g topically 2 (two) times daily as needed (rash under the breasts).    . ondansetron (ZOFRAN) 4 MG tablet Take 4 mg by mouth 3 (three) times daily as needed for nausea or vomiting.    Marland Kitchen oxyCODONE (ROXICODONE) 5 MG immediate release tablet Take 1 tablet (5 mg total) by mouth every 6 (six) hours as needed for moderate pain or severe pain. 12 tablet 0  . TRAVATAN Z 0.004 % SOLN ophthalmic solution Place 1 drop into both eyes at bedtime.    . Vitamin D, Ergocalciferol, (DRISDOL) 50000 units CAPS capsule Take 1 capsule (50,000 Units total) by mouth every 30 (thirty) days. (Patient taking differently: Take 50,000 Units by mouth. Every 28 days) 1 capsule 11   No current facility-administered medications for this visit.  Allergies  Allergen Reactions  . Ibuprofen Swelling  . Mushroom Extract Complex Nausea And Vomiting  . Valium [Diazepam] Other (See Comments)    Unknown   . Shellfish Allergy Rash    No family history on file.  Social History   Socioeconomic History  . Marital status: Widowed    Spouse name: Not on file  . Number of children: 2  . Years of education: Not on file  . Highest education level: Not on file  Occupational History  . Occupation: Production designer, theatre/television/film    Comment: [employer]  Social Needs  . Financial resource strain: Not on file  .  Food insecurity:    Worry: Not on file    Inability: Not on file  . Transportation needs:    Medical: Not on file    Non-medical: Not on file  Tobacco Use  . Smoking status: Never Smoker  . Smokeless tobacco: Never Used  Substance and Sexual Activity  . Alcohol use: No  . Drug use: No  . Sexual activity: Not Currently  Lifestyle  . Physical activity:    Days per week: Not on file    Minutes per session: Not on file  . Stress: Not on file  Relationships  . Social connections:    Talks on phone: Not on file    Gets together: Not on file    Attends religious service: Not on file    Active member of club or organization: Not on file    Attends meetings of clubs or organizations: Not on file    Relationship status: Not on file  . Intimate partner violence:    Fear of current or ex partner: Not on file    Emotionally abused: Not on file    Physically abused: Not on file    Forced sexual activity: Not on file  Other Topics Concern  . Not on file  Social History Narrative   No living will   Would want daughter and son to make health care decisions for her   Would accept resuscitation--but no prolonged ventilation   Would not want extended tube feeds--would accept short term to determine prognosis     Constitutional: Denies fever, malaise, fatigue, headache or abrupt weight changes.  HEENT: Denies eye pain, eye redness, ear pain, ringing in the ears, wax buildup, runny nose, nasal congestion, bloody nose, or sore throat. Respiratory: Denies difficulty breathing, shortness of breath, cough or sputum production.   Cardiovascular: Denies chest pain, chest tightness, palpitations or swelling in the hands or feet.  Gastrointestinal: Denies abdominal pain, bloating, constipation, diarrhea or blood in the stool.  GU: Pt reports urinary incontinence. Denies urgency, frequency, pain with urination, burning sensation, blood in urine, odor or discharge. Musculoskeletal: Pt reports  difficulty with gait, frequent falls. Denies decrease in range of motion, muscle pain or joint pain and swelling.  Skin: Pt reports skin tear to left arm. Denies redness, rashes, lesions or ulcercations.  Neurological: Pt reports problems with balance. Denies dizziness, difficulty with memory, difficulty with speech or problems with balance Psych: Denies anxiety, depression, SI/HI.  No other specific complaints in a complete review of systems (except as listed in HPI above).  Objective:   Physical Exam   BP 111/72   Pulse 75   Temp 98.9 F (37.2 C)   Resp 18   Wt 110 lb (49.9 kg)   BMI 20.12 kg/m  Wt Readings from Last 3 Encounters:  10/08/18 110 lb (49.9 kg)  08/03/18 111  lb 9.6 oz (50.6 kg)  07/24/18 108 lb (49 kg)    General: Appears her stated age, in NAD. Skin: Skin tear covered with bandage, right arm.  Neck:  No nodes or JVD Cardiovascular: Normal rate and rhythm. S1,S2 noted.  No murmur, rubs or gallops noted. Trace BLE edema.  Pulmonary/Chest: Normal effort and positive vesicular breath sounds. No respiratory distress. No wheezes, rales or ronchi noted.  Abdomen: Soft and nontender. Normal bowel sounds.  Musculoskeletal: Gait slow and steady with use of cane. Neurological: Alert and oriented. Psychiatric: Mood and affect normal.   BMET    Component Value Date/Time   NA 138 07/24/2018 0639   NA 142 11/01/2013 1403   K 5.1 07/24/2018 0639   K 3.7 11/01/2013 1403   CL 107 07/24/2018 0639   CL 103 11/01/2013 1403   CO2 25 07/24/2018 0639   CO2 32 11/01/2013 1403   GLUCOSE 90 07/24/2018 0639   GLUCOSE 106 (H) 11/01/2013 1403   BUN 25 (H) 07/24/2018 0639   BUN 18 11/01/2013 1403   CREATININE 0.71 07/24/2018 0639   CREATININE 0.88 11/01/2013 1403   CALCIUM 9.2 07/24/2018 0639   CALCIUM 9.3 11/01/2013 1403   GFRNONAA >60 07/24/2018 0639   GFRNONAA >60 11/01/2013 1403   GFRAA >60 07/24/2018 0639   GFRAA >60 11/01/2013 1403    Lipid Panel  No results found  for: CHOL, TRIG, HDL, CHOLHDL, VLDL, LDLCALC  CBC    Component Value Date/Time   WBC 5.4 07/24/2018 0639   RBC 4.25 07/24/2018 0639   HGB 13.4 07/24/2018 0639   HGB 14.6 11/01/2013 1403   HCT 39.7 07/24/2018 0639   HCT 42.6 11/01/2013 1403   PLT 159 07/24/2018 0639   PLT 185 11/01/2013 1403   MCV 93.4 07/24/2018 0639   MCV 94 11/01/2013 1403   MCH 31.5 07/24/2018 0639   MCHC 33.8 07/24/2018 0639   RDW 13.6 07/24/2018 0639   RDW 13.5 11/01/2013 1403   LYMPHSABS 0.9 (L) 09/06/2016 0334   LYMPHSABS 1.3 11/01/2013 1403   MONOABS 0.6 09/06/2016 0334   MONOABS 0.4 11/01/2013 1403   EOSABS 0.0 09/06/2016 0334   EOSABS 0.0 11/01/2013 1403   BASOSABS 0.0 09/06/2016 0334   BASOSABS 0.0 11/01/2013 1403    Hgb A1C No results found for: HGBA1C         Assessment & Plan:

## 2018-10-08 NOTE — Assessment & Plan Note (Signed)
Continue Famotidine Will monitor

## 2018-10-08 NOTE — Assessment & Plan Note (Signed)
Continue Tylenol prn 

## 2018-10-08 NOTE — Assessment & Plan Note (Signed)
Continue Sinemet Appreciate ALF care Will monitor

## 2018-10-13 DIAGNOSIS — W19XXXD Unspecified fall, subsequent encounter: Secondary | ICD-10-CM | POA: Diagnosis not present

## 2018-10-13 DIAGNOSIS — M6281 Muscle weakness (generalized): Secondary | ICD-10-CM | POA: Diagnosis not present

## 2018-10-13 DIAGNOSIS — R278 Other lack of coordination: Secondary | ICD-10-CM | POA: Diagnosis not present

## 2018-10-13 DIAGNOSIS — R2689 Other abnormalities of gait and mobility: Secondary | ICD-10-CM | POA: Diagnosis not present

## 2018-10-13 DIAGNOSIS — R293 Abnormal posture: Secondary | ICD-10-CM | POA: Diagnosis not present

## 2018-10-15 DIAGNOSIS — R293 Abnormal posture: Secondary | ICD-10-CM | POA: Diagnosis not present

## 2018-10-15 DIAGNOSIS — R278 Other lack of coordination: Secondary | ICD-10-CM | POA: Diagnosis not present

## 2018-10-15 DIAGNOSIS — R4189 Other symptoms and signs involving cognitive functions and awareness: Secondary | ICD-10-CM | POA: Diagnosis not present

## 2018-10-15 DIAGNOSIS — R2689 Other abnormalities of gait and mobility: Secondary | ICD-10-CM | POA: Diagnosis not present

## 2018-10-15 DIAGNOSIS — M6281 Muscle weakness (generalized): Secondary | ICD-10-CM | POA: Diagnosis not present

## 2018-10-15 DIAGNOSIS — Z741 Need for assistance with personal care: Secondary | ICD-10-CM | POA: Diagnosis not present

## 2018-10-15 DIAGNOSIS — G20 Parkinson's disease: Secondary | ICD-10-CM | POA: Diagnosis not present

## 2018-10-17 DIAGNOSIS — G20 Parkinson's disease: Secondary | ICD-10-CM | POA: Diagnosis not present

## 2018-10-17 DIAGNOSIS — Z741 Need for assistance with personal care: Secondary | ICD-10-CM | POA: Diagnosis not present

## 2018-10-17 DIAGNOSIS — R293 Abnormal posture: Secondary | ICD-10-CM | POA: Diagnosis not present

## 2018-10-17 DIAGNOSIS — R2689 Other abnormalities of gait and mobility: Secondary | ICD-10-CM | POA: Diagnosis not present

## 2018-10-17 DIAGNOSIS — R278 Other lack of coordination: Secondary | ICD-10-CM | POA: Diagnosis not present

## 2018-10-17 DIAGNOSIS — M6281 Muscle weakness (generalized): Secondary | ICD-10-CM | POA: Diagnosis not present

## 2018-10-20 DIAGNOSIS — Z741 Need for assistance with personal care: Secondary | ICD-10-CM | POA: Diagnosis not present

## 2018-10-20 DIAGNOSIS — R2689 Other abnormalities of gait and mobility: Secondary | ICD-10-CM | POA: Diagnosis not present

## 2018-10-20 DIAGNOSIS — G20 Parkinson's disease: Secondary | ICD-10-CM | POA: Diagnosis not present

## 2018-10-20 DIAGNOSIS — R278 Other lack of coordination: Secondary | ICD-10-CM | POA: Diagnosis not present

## 2018-10-20 DIAGNOSIS — R293 Abnormal posture: Secondary | ICD-10-CM | POA: Diagnosis not present

## 2018-10-20 DIAGNOSIS — M6281 Muscle weakness (generalized): Secondary | ICD-10-CM | POA: Diagnosis not present

## 2018-10-22 DIAGNOSIS — R278 Other lack of coordination: Secondary | ICD-10-CM | POA: Diagnosis not present

## 2018-10-22 DIAGNOSIS — M6281 Muscle weakness (generalized): Secondary | ICD-10-CM | POA: Diagnosis not present

## 2018-10-22 DIAGNOSIS — R2689 Other abnormalities of gait and mobility: Secondary | ICD-10-CM | POA: Diagnosis not present

## 2018-10-22 DIAGNOSIS — Z741 Need for assistance with personal care: Secondary | ICD-10-CM | POA: Diagnosis not present

## 2018-10-22 DIAGNOSIS — G20 Parkinson's disease: Secondary | ICD-10-CM | POA: Diagnosis not present

## 2018-10-22 DIAGNOSIS — R293 Abnormal posture: Secondary | ICD-10-CM | POA: Diagnosis not present

## 2018-10-24 DIAGNOSIS — M6281 Muscle weakness (generalized): Secondary | ICD-10-CM | POA: Diagnosis not present

## 2018-10-24 DIAGNOSIS — G20 Parkinson's disease: Secondary | ICD-10-CM | POA: Diagnosis not present

## 2018-10-24 DIAGNOSIS — R278 Other lack of coordination: Secondary | ICD-10-CM | POA: Diagnosis not present

## 2018-10-24 DIAGNOSIS — Z741 Need for assistance with personal care: Secondary | ICD-10-CM | POA: Diagnosis not present

## 2018-10-24 DIAGNOSIS — R293 Abnormal posture: Secondary | ICD-10-CM | POA: Diagnosis not present

## 2018-10-24 DIAGNOSIS — R2689 Other abnormalities of gait and mobility: Secondary | ICD-10-CM | POA: Diagnosis not present

## 2018-10-27 DIAGNOSIS — M6281 Muscle weakness (generalized): Secondary | ICD-10-CM | POA: Diagnosis not present

## 2018-10-27 DIAGNOSIS — R293 Abnormal posture: Secondary | ICD-10-CM | POA: Diagnosis not present

## 2018-10-27 DIAGNOSIS — Z741 Need for assistance with personal care: Secondary | ICD-10-CM | POA: Diagnosis not present

## 2018-10-27 DIAGNOSIS — G20 Parkinson's disease: Secondary | ICD-10-CM | POA: Diagnosis not present

## 2018-10-27 DIAGNOSIS — R278 Other lack of coordination: Secondary | ICD-10-CM | POA: Diagnosis not present

## 2018-10-27 DIAGNOSIS — R2689 Other abnormalities of gait and mobility: Secondary | ICD-10-CM | POA: Diagnosis not present

## 2018-10-29 DIAGNOSIS — R278 Other lack of coordination: Secondary | ICD-10-CM | POA: Diagnosis not present

## 2018-10-29 DIAGNOSIS — Z741 Need for assistance with personal care: Secondary | ICD-10-CM | POA: Diagnosis not present

## 2018-10-29 DIAGNOSIS — G20 Parkinson's disease: Secondary | ICD-10-CM | POA: Diagnosis not present

## 2018-10-29 DIAGNOSIS — M6281 Muscle weakness (generalized): Secondary | ICD-10-CM | POA: Diagnosis not present

## 2018-10-29 DIAGNOSIS — R2689 Other abnormalities of gait and mobility: Secondary | ICD-10-CM | POA: Diagnosis not present

## 2018-10-29 DIAGNOSIS — R293 Abnormal posture: Secondary | ICD-10-CM | POA: Diagnosis not present

## 2018-10-30 DIAGNOSIS — R2689 Other abnormalities of gait and mobility: Secondary | ICD-10-CM | POA: Diagnosis not present

## 2018-10-30 DIAGNOSIS — R293 Abnormal posture: Secondary | ICD-10-CM | POA: Diagnosis not present

## 2018-10-30 DIAGNOSIS — Z741 Need for assistance with personal care: Secondary | ICD-10-CM | POA: Diagnosis not present

## 2018-10-30 DIAGNOSIS — M6281 Muscle weakness (generalized): Secondary | ICD-10-CM | POA: Diagnosis not present

## 2018-10-30 DIAGNOSIS — G20 Parkinson's disease: Secondary | ICD-10-CM | POA: Diagnosis not present

## 2018-10-30 DIAGNOSIS — R278 Other lack of coordination: Secondary | ICD-10-CM | POA: Diagnosis not present

## 2018-10-31 DIAGNOSIS — R2689 Other abnormalities of gait and mobility: Secondary | ICD-10-CM | POA: Diagnosis not present

## 2018-10-31 DIAGNOSIS — G20 Parkinson's disease: Secondary | ICD-10-CM | POA: Diagnosis not present

## 2018-10-31 DIAGNOSIS — R278 Other lack of coordination: Secondary | ICD-10-CM | POA: Diagnosis not present

## 2018-10-31 DIAGNOSIS — Z741 Need for assistance with personal care: Secondary | ICD-10-CM | POA: Diagnosis not present

## 2018-10-31 DIAGNOSIS — M6281 Muscle weakness (generalized): Secondary | ICD-10-CM | POA: Diagnosis not present

## 2018-10-31 DIAGNOSIS — R293 Abnormal posture: Secondary | ICD-10-CM | POA: Diagnosis not present

## 2018-11-03 DIAGNOSIS — M6281 Muscle weakness (generalized): Secondary | ICD-10-CM | POA: Diagnosis not present

## 2018-11-03 DIAGNOSIS — R2689 Other abnormalities of gait and mobility: Secondary | ICD-10-CM | POA: Diagnosis not present

## 2018-11-03 DIAGNOSIS — Z741 Need for assistance with personal care: Secondary | ICD-10-CM | POA: Diagnosis not present

## 2018-11-03 DIAGNOSIS — R278 Other lack of coordination: Secondary | ICD-10-CM | POA: Diagnosis not present

## 2018-11-03 DIAGNOSIS — G20 Parkinson's disease: Secondary | ICD-10-CM | POA: Diagnosis not present

## 2018-11-03 DIAGNOSIS — R293 Abnormal posture: Secondary | ICD-10-CM | POA: Diagnosis not present

## 2018-11-05 DIAGNOSIS — R293 Abnormal posture: Secondary | ICD-10-CM | POA: Diagnosis not present

## 2018-11-05 DIAGNOSIS — Z741 Need for assistance with personal care: Secondary | ICD-10-CM | POA: Diagnosis not present

## 2018-11-05 DIAGNOSIS — R2689 Other abnormalities of gait and mobility: Secondary | ICD-10-CM | POA: Diagnosis not present

## 2018-11-05 DIAGNOSIS — R278 Other lack of coordination: Secondary | ICD-10-CM | POA: Diagnosis not present

## 2018-11-05 DIAGNOSIS — G20 Parkinson's disease: Secondary | ICD-10-CM | POA: Diagnosis not present

## 2018-11-05 DIAGNOSIS — M6281 Muscle weakness (generalized): Secondary | ICD-10-CM | POA: Diagnosis not present

## 2018-11-07 DIAGNOSIS — R278 Other lack of coordination: Secondary | ICD-10-CM | POA: Diagnosis not present

## 2018-11-07 DIAGNOSIS — R2689 Other abnormalities of gait and mobility: Secondary | ICD-10-CM | POA: Diagnosis not present

## 2018-11-07 DIAGNOSIS — M6281 Muscle weakness (generalized): Secondary | ICD-10-CM | POA: Diagnosis not present

## 2018-11-07 DIAGNOSIS — Z741 Need for assistance with personal care: Secondary | ICD-10-CM | POA: Diagnosis not present

## 2018-11-07 DIAGNOSIS — G20 Parkinson's disease: Secondary | ICD-10-CM | POA: Diagnosis not present

## 2018-11-07 DIAGNOSIS — R293 Abnormal posture: Secondary | ICD-10-CM | POA: Diagnosis not present

## 2018-11-10 DIAGNOSIS — R278 Other lack of coordination: Secondary | ICD-10-CM | POA: Diagnosis not present

## 2018-11-10 DIAGNOSIS — R293 Abnormal posture: Secondary | ICD-10-CM | POA: Diagnosis not present

## 2018-11-10 DIAGNOSIS — Z741 Need for assistance with personal care: Secondary | ICD-10-CM | POA: Diagnosis not present

## 2018-11-10 DIAGNOSIS — R2689 Other abnormalities of gait and mobility: Secondary | ICD-10-CM | POA: Diagnosis not present

## 2018-11-10 DIAGNOSIS — M6281 Muscle weakness (generalized): Secondary | ICD-10-CM | POA: Diagnosis not present

## 2018-11-10 DIAGNOSIS — G20 Parkinson's disease: Secondary | ICD-10-CM | POA: Diagnosis not present

## 2018-11-12 DIAGNOSIS — M6281 Muscle weakness (generalized): Secondary | ICD-10-CM | POA: Diagnosis not present

## 2018-11-12 DIAGNOSIS — Z741 Need for assistance with personal care: Secondary | ICD-10-CM | POA: Diagnosis not present

## 2018-11-12 DIAGNOSIS — R278 Other lack of coordination: Secondary | ICD-10-CM | POA: Diagnosis not present

## 2018-11-12 DIAGNOSIS — G20 Parkinson's disease: Secondary | ICD-10-CM | POA: Diagnosis not present

## 2018-11-12 DIAGNOSIS — R2689 Other abnormalities of gait and mobility: Secondary | ICD-10-CM | POA: Diagnosis not present

## 2018-11-12 DIAGNOSIS — R293 Abnormal posture: Secondary | ICD-10-CM | POA: Diagnosis not present

## 2018-11-14 DIAGNOSIS — G20 Parkinson's disease: Secondary | ICD-10-CM | POA: Diagnosis not present

## 2018-11-14 DIAGNOSIS — R4189 Other symptoms and signs involving cognitive functions and awareness: Secondary | ICD-10-CM | POA: Diagnosis not present

## 2018-11-14 DIAGNOSIS — R278 Other lack of coordination: Secondary | ICD-10-CM | POA: Diagnosis not present

## 2018-11-14 DIAGNOSIS — Z741 Need for assistance with personal care: Secondary | ICD-10-CM | POA: Diagnosis not present

## 2018-11-17 DIAGNOSIS — R4189 Other symptoms and signs involving cognitive functions and awareness: Secondary | ICD-10-CM | POA: Diagnosis not present

## 2018-11-17 DIAGNOSIS — G20 Parkinson's disease: Secondary | ICD-10-CM | POA: Diagnosis not present

## 2018-11-17 DIAGNOSIS — Z741 Need for assistance with personal care: Secondary | ICD-10-CM | POA: Diagnosis not present

## 2018-11-17 DIAGNOSIS — R278 Other lack of coordination: Secondary | ICD-10-CM | POA: Diagnosis not present

## 2018-11-19 DIAGNOSIS — R278 Other lack of coordination: Secondary | ICD-10-CM | POA: Diagnosis not present

## 2018-11-19 DIAGNOSIS — G20 Parkinson's disease: Secondary | ICD-10-CM | POA: Diagnosis not present

## 2018-11-19 DIAGNOSIS — Z741 Need for assistance with personal care: Secondary | ICD-10-CM | POA: Diagnosis not present

## 2018-11-19 DIAGNOSIS — R4189 Other symptoms and signs involving cognitive functions and awareness: Secondary | ICD-10-CM | POA: Diagnosis not present

## 2018-11-21 DIAGNOSIS — G20 Parkinson's disease: Secondary | ICD-10-CM | POA: Diagnosis not present

## 2018-11-21 DIAGNOSIS — R4189 Other symptoms and signs involving cognitive functions and awareness: Secondary | ICD-10-CM | POA: Diagnosis not present

## 2018-11-21 DIAGNOSIS — Z741 Need for assistance with personal care: Secondary | ICD-10-CM | POA: Diagnosis not present

## 2018-11-21 DIAGNOSIS — R278 Other lack of coordination: Secondary | ICD-10-CM | POA: Diagnosis not present

## 2018-11-24 DIAGNOSIS — G20 Parkinson's disease: Secondary | ICD-10-CM | POA: Diagnosis not present

## 2018-11-24 DIAGNOSIS — R4189 Other symptoms and signs involving cognitive functions and awareness: Secondary | ICD-10-CM | POA: Diagnosis not present

## 2018-11-24 DIAGNOSIS — Z741 Need for assistance with personal care: Secondary | ICD-10-CM | POA: Diagnosis not present

## 2018-11-24 DIAGNOSIS — R278 Other lack of coordination: Secondary | ICD-10-CM | POA: Diagnosis not present

## 2018-11-26 DIAGNOSIS — R4189 Other symptoms and signs involving cognitive functions and awareness: Secondary | ICD-10-CM | POA: Diagnosis not present

## 2018-11-26 DIAGNOSIS — G20 Parkinson's disease: Secondary | ICD-10-CM | POA: Diagnosis not present

## 2018-11-26 DIAGNOSIS — R278 Other lack of coordination: Secondary | ICD-10-CM | POA: Diagnosis not present

## 2018-11-26 DIAGNOSIS — Z741 Need for assistance with personal care: Secondary | ICD-10-CM | POA: Diagnosis not present

## 2018-12-01 DIAGNOSIS — R4189 Other symptoms and signs involving cognitive functions and awareness: Secondary | ICD-10-CM | POA: Diagnosis not present

## 2018-12-01 DIAGNOSIS — Z741 Need for assistance with personal care: Secondary | ICD-10-CM | POA: Diagnosis not present

## 2018-12-01 DIAGNOSIS — G20 Parkinson's disease: Secondary | ICD-10-CM | POA: Diagnosis not present

## 2018-12-01 DIAGNOSIS — R278 Other lack of coordination: Secondary | ICD-10-CM | POA: Diagnosis not present

## 2018-12-02 DIAGNOSIS — R278 Other lack of coordination: Secondary | ICD-10-CM | POA: Diagnosis not present

## 2018-12-02 DIAGNOSIS — G20 Parkinson's disease: Secondary | ICD-10-CM | POA: Diagnosis not present

## 2018-12-02 DIAGNOSIS — Z741 Need for assistance with personal care: Secondary | ICD-10-CM | POA: Diagnosis not present

## 2018-12-02 DIAGNOSIS — R4189 Other symptoms and signs involving cognitive functions and awareness: Secondary | ICD-10-CM | POA: Diagnosis not present

## 2018-12-03 DIAGNOSIS — Z741 Need for assistance with personal care: Secondary | ICD-10-CM | POA: Diagnosis not present

## 2018-12-03 DIAGNOSIS — R4189 Other symptoms and signs involving cognitive functions and awareness: Secondary | ICD-10-CM | POA: Diagnosis not present

## 2018-12-03 DIAGNOSIS — G20 Parkinson's disease: Secondary | ICD-10-CM | POA: Diagnosis not present

## 2018-12-03 DIAGNOSIS — R278 Other lack of coordination: Secondary | ICD-10-CM | POA: Diagnosis not present

## 2018-12-04 DIAGNOSIS — G20 Parkinson's disease: Secondary | ICD-10-CM | POA: Diagnosis not present

## 2018-12-04 DIAGNOSIS — K219 Gastro-esophageal reflux disease without esophagitis: Secondary | ICD-10-CM | POA: Diagnosis not present

## 2018-12-05 DIAGNOSIS — Z741 Need for assistance with personal care: Secondary | ICD-10-CM | POA: Diagnosis not present

## 2018-12-05 DIAGNOSIS — R278 Other lack of coordination: Secondary | ICD-10-CM | POA: Diagnosis not present

## 2018-12-05 DIAGNOSIS — G20 Parkinson's disease: Secondary | ICD-10-CM | POA: Diagnosis not present

## 2018-12-05 DIAGNOSIS — R4189 Other symptoms and signs involving cognitive functions and awareness: Secondary | ICD-10-CM | POA: Diagnosis not present

## 2018-12-08 DIAGNOSIS — R278 Other lack of coordination: Secondary | ICD-10-CM | POA: Diagnosis not present

## 2018-12-08 DIAGNOSIS — Z741 Need for assistance with personal care: Secondary | ICD-10-CM | POA: Diagnosis not present

## 2018-12-08 DIAGNOSIS — R4189 Other symptoms and signs involving cognitive functions and awareness: Secondary | ICD-10-CM | POA: Diagnosis not present

## 2018-12-08 DIAGNOSIS — G20 Parkinson's disease: Secondary | ICD-10-CM | POA: Diagnosis not present

## 2018-12-10 DIAGNOSIS — R410 Disorientation, unspecified: Secondary | ICD-10-CM | POA: Diagnosis not present

## 2018-12-15 DIAGNOSIS — R443 Hallucinations, unspecified: Secondary | ICD-10-CM | POA: Insufficient documentation

## 2018-12-15 DIAGNOSIS — G20 Parkinson's disease: Secondary | ICD-10-CM | POA: Diagnosis not present

## 2018-12-18 ENCOUNTER — Encounter: Payer: Self-pay | Admitting: Internal Medicine

## 2018-12-18 ENCOUNTER — Other Ambulatory Visit: Payer: Self-pay

## 2018-12-18 ENCOUNTER — Ambulatory Visit: Payer: Medicare Other | Admitting: Internal Medicine

## 2018-12-18 VITALS — BP 116/72 | HR 76 | Resp 18 | Wt 111.4 lb

## 2018-12-18 DIAGNOSIS — G20 Parkinson's disease: Secondary | ICD-10-CM | POA: Diagnosis not present

## 2018-12-18 DIAGNOSIS — F0390 Unspecified dementia without behavioral disturbance: Secondary | ICD-10-CM

## 2018-12-18 DIAGNOSIS — K219 Gastro-esophageal reflux disease without esophagitis: Secondary | ICD-10-CM | POA: Diagnosis not present

## 2018-12-18 DIAGNOSIS — E441 Mild protein-calorie malnutrition: Secondary | ICD-10-CM | POA: Diagnosis not present

## 2018-12-18 NOTE — Assessment & Plan Note (Addendum)
I am not sure that weight is correct Will consider supplements if it is Make sure she makes it to all the meals  Recheck weight is 111#--so still needs to eat better but not critical

## 2018-12-18 NOTE — Assessment & Plan Note (Signed)
Early Likely related to the Parkinson's Staff here are reevaluating whether she needs a higher level of care

## 2018-12-18 NOTE — Assessment & Plan Note (Signed)
Controlled with the medicaiton

## 2018-12-18 NOTE — Assessment & Plan Note (Signed)
Has had some functional decline Recent change to extended release sinemet---will monitor Has needed aides with care this week--may need higher level of care

## 2018-12-18 NOTE — Progress Notes (Signed)
Subjective:    Patient ID: Cheryl Cummings, female    DOB: 19-Apr-1941, 78 y.o.   MRN: [id number]  HPI Visit in assisted living apartment for review of chronic health conditions Reviewed status with Magda PaganiniAudrey RN  Has not been doing well Missing some meals Weight down considerably Feels she isn't always physically able to go down --like some breakfasts  Had a change in the sinemet---now on extended release Did notice sleepiness after the IR pills ?off phenomenon as well Too soon to tell about the medication change  Heartburn doesn't seem to be active No dysphagia  Staff have noted some confusion She finds "I have to stop and get my words by together" She feels mixed up at times  Current Outpatient Medications on File Prior to Visit  Medication Sig Dispense Refill  . acetaminophen (TYLENOL) 325 MG tablet Take 325-650 mg by mouth every 4 (four) hours as needed for mild pain or fever.    . Carbidopa-Levodopa ER (SINEMET CR) 25-100 MG tablet controlled release Take 1 tablet by mouth 2 (two) times daily.    . dorzolamide-timolol (COSOPT) 22.3-6.8 MG/ML ophthalmic solution Place 1 drop into both eyes 2 (two) times daily.    . famotidine (PEPCID) 20 MG tablet Take 1 tablet (20 mg total) by mouth 2 (two) times daily. (Patient taking differently: Take 20 mg by mouth daily as needed for heartburn or indigestion. ) 60 tablet 0  . Multiple Vitamins-Minerals (MULTIVITAMIN WITH MINERALS) tablet Take 1 tablet by mouth daily.    . TRAVATAN Z 0.004 % SOLN ophthalmic solution Place 1 drop into both eyes at bedtime.    . Vitamin D, Ergocalciferol, (DRISDOL) 50000 units CAPS capsule Take 1 capsule (50,000 Units total) by mouth every 30 (thirty) days. (Patient taking differently: Take 50,000 Units by mouth. Every 28 days) 1 capsule 11   No current facility-administered medications on file prior to visit.     Allergies  Allergen Reactions  . Ibuprofen Swelling  . Mushroom Extract Complex Nausea  And Vomiting  . Valium [Diazepam] Other (See Comments)    Unknown   . Shellfish Allergy Rash    Past Medical History:  Diagnosis Date  . Essential hypertension, benign   . Generalized osteoarthritis of multiple sites    mild  . GERD (gastroesophageal reflux disease)   . Glaucoma   . Parkinson disease [facility](HCC)     Past Surgical History:  Procedure Laterality Date  . VAGINAL DELIVERY     twice    History reviewed. No pertinent family history.  Social History   Socioeconomic History  . Marital status: Widowed    Spouse name: Not on file  . Number of children: 2  . Years of education: Not on file  . Highest education level: Not on file  Occupational History  . Occupation: Production designer, theatre/television/filmAdministrator    Comment: [employer]  Social Needs  . Financial resource strain: Not on file  . Food insecurity:    Worry: Not on file    Inability: Not on file  . Transportation needs:    Medical: Not on file    Non-medical: Not on file  Tobacco Use  . Smoking status: Never Smoker  . Smokeless tobacco: Never Used  Substance and Sexual Activity  . Alcohol use: No  . Drug use: No  . Sexual activity: Not Currently  Lifestyle  . Physical activity:    Days per week: Not on file    Minutes per session: Not on file  .  Stress: Not on file  Relationships  . Social connections:    Talks on phone: Not on file    Gets together: Not on file    Attends religious service: Not on file    Active member of club or organization: Not on file    Attends meetings of clubs or organizations: Not on file    Relationship status: Not on file  . Intimate partner violence:    Fear of current or ex partner: Not on file    Emotionally abused: Not on file    Physically abused: Not on file    Forced sexual activity: Not on file  Other Topics Concern  . Not on file  Social History Narrative   No living will   Would want daughter and son to make health care decisions for her   Would accept  resuscitation--but no prolonged ventilation   Would not want extended tube feeds--would accept short term to determine prognosis   Review of Systems Sleeping fairly well Bowels are regular--with fresh fruit daily No incontinence No sig joint pains No chest pain No SOB    Objective:   Physical Exam  Constitutional: She appears well-developed. No distress.  Neck:  Flexed and to right  Cardiovascular: Normal rate, regular rhythm and normal heart sounds. Exam reveals no gallop.  No murmur heard. Respiratory: Effort normal and breath sounds normal. No respiratory distress. She has no wheezes. She has no rales.  GI: Soft. There is no abdominal tenderness.  Musculoskeletal:     Comments: 1+ non pitting edema  Neurological:  May 2000 [facility]---[employer] President---Donald Trump, ? [phone]  Some bradykinesia  Psychiatric: She has a normal mood and affect. Her behavior is normal.  Rambles a little but fairly appropriate interaction (repeats herself some)           Assessment & Plan:

## 2018-12-20 DIAGNOSIS — W19XXXA Unspecified fall, initial encounter: Secondary | ICD-10-CM | POA: Diagnosis not present

## 2018-12-20 DIAGNOSIS — R0781 Pleurodynia: Secondary | ICD-10-CM | POA: Diagnosis not present

## 2018-12-21 ENCOUNTER — Emergency Department: Payer: Medicare Other

## 2018-12-21 ENCOUNTER — Other Ambulatory Visit: Payer: Self-pay

## 2018-12-21 ENCOUNTER — Emergency Department
Admission: EM | Admit: 2018-12-21 | Discharge: 2018-12-21 | Disposition: A | Discharge status: Skilled Nursing Facility | Payer: Medicare Other | Attending: Emergency Medicine | Admitting: Emergency Medicine

## 2018-12-21 DIAGNOSIS — W19XXXA Unspecified fall, initial encounter: Secondary | ICD-10-CM

## 2018-12-21 DIAGNOSIS — S79912A Unspecified injury of left hip, initial encounter: Secondary | ICD-10-CM | POA: Diagnosis not present

## 2018-12-21 DIAGNOSIS — G20 Parkinson's disease: Secondary | ICD-10-CM | POA: Diagnosis not present

## 2018-12-21 DIAGNOSIS — R410 Disorientation, unspecified: Secondary | ICD-10-CM | POA: Diagnosis not present

## 2018-12-21 DIAGNOSIS — R531 Weakness: Secondary | ICD-10-CM | POA: Insufficient documentation

## 2018-12-21 DIAGNOSIS — M25522 Pain in left elbow: Secondary | ICD-10-CM | POA: Insufficient documentation

## 2018-12-21 DIAGNOSIS — Y939 Activity, unspecified: Secondary | ICD-10-CM | POA: Insufficient documentation

## 2018-12-21 DIAGNOSIS — S2232XA Fracture of one rib, left side, initial encounter for closed fracture: Secondary | ICD-10-CM | POA: Diagnosis not present

## 2018-12-21 DIAGNOSIS — Y929 Unspecified place or not applicable: Secondary | ICD-10-CM | POA: Insufficient documentation

## 2018-12-21 DIAGNOSIS — I10 Essential (primary) hypertension: Secondary | ICD-10-CM | POA: Diagnosis not present

## 2018-12-21 DIAGNOSIS — S2242XA Multiple fractures of ribs, left side, initial encounter for closed fracture: Secondary | ICD-10-CM | POA: Diagnosis not present

## 2018-12-21 DIAGNOSIS — W06XXXA Fall from bed, initial encounter: Secondary | ICD-10-CM | POA: Insufficient documentation

## 2018-12-21 DIAGNOSIS — Y999 Unspecified external cause status: Secondary | ICD-10-CM | POA: Insufficient documentation

## 2018-12-21 DIAGNOSIS — S2231XA Fracture of one rib, right side, initial encounter for closed fracture: Secondary | ICD-10-CM | POA: Diagnosis not present

## 2018-12-21 DIAGNOSIS — M25552 Pain in left hip: Secondary | ICD-10-CM | POA: Diagnosis not present

## 2018-12-21 DIAGNOSIS — R52 Pain, unspecified: Secondary | ICD-10-CM

## 2018-12-21 DIAGNOSIS — R51 Headache: Secondary | ICD-10-CM | POA: Diagnosis not present

## 2018-12-21 DIAGNOSIS — S0990XA Unspecified injury of head, initial encounter: Secondary | ICD-10-CM | POA: Diagnosis not present

## 2018-12-21 DIAGNOSIS — S59902A Unspecified injury of left elbow, initial encounter: Secondary | ICD-10-CM | POA: Diagnosis not present

## 2018-12-21 DIAGNOSIS — Z9181 History of falling: Secondary | ICD-10-CM | POA: Insufficient documentation

## 2018-12-21 LAB — COMPREHENSIVE METABOLIC PANEL
ALT: 18 U/L (ref 0–44)
AST: 25 U/L (ref 15–41)
Albumin: 3.7 g/dL (ref 3.5–5.0)
Alkaline Phosphatase: 69 U/L (ref 38–126)
Anion gap: 7 (ref 5–15)
BUN: 18 mg/dL (ref 8–23)
CO2: 26 mmol/L (ref 22–32)
Calcium: 9 mg/dL (ref 8.9–10.3)
Chloride: 108 mmol/L (ref 98–111)
Creatinine, Ser: 0.73 mg/dL (ref 0.44–1.00)
GFR calc Af Amer: >60 mL/min (ref >60)
GFR calc non Af Amer: >60 mL/min (ref >60)
Glucose, Bld: 99 mg/dL (ref 70–99)
Potassium: 4.1 mmol/L (ref 3.5–5.1)
Sodium: 141 mmol/L (ref 135–145)
Total Bilirubin: 1.1 mg/dL (ref 0.3–1.2)
Total Protein: 6.5 g/dL (ref 6.5–8.1)

## 2018-12-21 LAB — CBC WITH DIFFERENTIAL/PLATELET
Abs Immature Granulocytes: 0.04 10*3/uL (ref 0.00–0.07)
Basophils Absolute: 0 10*3/uL (ref 0.0–0.1)
Basophils Relative: 1 %
Eosinophils Absolute: 0.1 10*3/uL (ref 0.0–0.5)
Eosinophils Relative: 2 %
HCT: 39.9 % (ref 36.0–46.0)
Hemoglobin: 13.5 g/dL (ref 12.0–15.0)
Immature Granulocytes: 1 %
Lymphocytes Relative: 15 %
Lymphs Abs: 0.8 10*3/uL (ref 0.7–4.0)
MCH: 32.1 pg (ref 26.0–34.0)
MCHC: 33.8 g/dL (ref 30.0–36.0)
MCV: 94.8 fL (ref 80.0–100.0)
Monocytes Absolute: 0.4 10*3/uL (ref 0.1–1.0)
Monocytes Relative: 8 %
Neutro Abs: 4 10*3/uL (ref 1.7–7.7)
Neutrophils Relative %: 73 %
Platelets: 155 10*3/uL (ref 150–400)
RBC: 4.21 MIL/uL (ref 3.87–5.11)
RDW: 13.7 % (ref 11.5–15.5)
WBC: 5.4 10*3/uL (ref 4.0–10.5)
nRBC: 0 % (ref 0.0–0.2)

## 2018-12-21 LAB — URINALYSIS, COMPLETE (UACMP) WITH MICROSCOPIC
Bilirubin Urine: NEGATIVE
Glucose, UA: NEGATIVE mg/dL
Hgb urine dipstick: NEGATIVE
Ketones, ur: NEGATIVE mg/dL
Nitrite: NEGATIVE
Protein, ur: NEGATIVE mg/dL
Specific Gravity, Urine: 1.011 (ref 1.005–1.030)
pH: 7 (ref 5.0–8.0)

## 2018-12-21 LAB — TROPONIN I: Troponin I: <0.03 ng/mL (ref <0.03)
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
## 2018-12-22 DIAGNOSIS — F0390 Unspecified dementia without behavioral disturbance: Secondary | ICD-10-CM | POA: Diagnosis not present

## 2018-12-22 DIAGNOSIS — E441 Mild protein-calorie malnutrition: Secondary | ICD-10-CM | POA: Diagnosis not present

## 2018-12-22 DIAGNOSIS — R4189 Other symptoms and signs involving cognitive functions and awareness: Secondary | ICD-10-CM | POA: Diagnosis not present

## 2018-12-22 DIAGNOSIS — M25519 Pain in unspecified shoulder: Secondary | ICD-10-CM | POA: Diagnosis not present

## 2018-12-22 DIAGNOSIS — R441 Visual hallucinations: Secondary | ICD-10-CM | POA: Diagnosis not present

## 2018-12-22 DIAGNOSIS — H409 Unspecified glaucoma: Secondary | ICD-10-CM | POA: Diagnosis not present

## 2018-12-22 DIAGNOSIS — G20 Parkinson's disease: Secondary | ICD-10-CM | POA: Diagnosis not present

## 2018-12-22 DIAGNOSIS — R443 Hallucinations, unspecified: Secondary | ICD-10-CM | POA: Diagnosis not present

## 2018-12-22 DIAGNOSIS — I10 Essential (primary) hypertension: Secondary | ICD-10-CM | POA: Diagnosis not present

## 2018-12-22 DIAGNOSIS — W19XXXD Unspecified fall, subsequent encounter: Secondary | ICD-10-CM | POA: Diagnosis not present

## 2018-12-22 DIAGNOSIS — K219 Gastro-esophageal reflux disease without esophagitis: Secondary | ICD-10-CM | POA: Diagnosis not present

## 2018-12-22 DIAGNOSIS — F0280 Dementia in other diseases classified elsewhere without behavioral disturbance: Secondary | ICD-10-CM | POA: Diagnosis not present

## 2018-12-22 DIAGNOSIS — R278 Other lack of coordination: Secondary | ICD-10-CM | POA: Diagnosis not present

## 2018-12-22 DIAGNOSIS — Z741 Need for assistance with personal care: Secondary | ICD-10-CM | POA: Diagnosis not present

## 2018-12-22 DIAGNOSIS — F39 Unspecified mood [affective] disorder: Secondary | ICD-10-CM | POA: Diagnosis not present

## 2018-12-22 DIAGNOSIS — S2239XD Fracture of one rib, unspecified side, subsequent encounter for fracture with routine healing: Secondary | ICD-10-CM | POA: Diagnosis not present

## 2018-12-23 DIAGNOSIS — F0390 Unspecified dementia without behavioral disturbance: Secondary | ICD-10-CM

## 2018-12-23 DIAGNOSIS — G20 Parkinson's disease: Secondary | ICD-10-CM | POA: Diagnosis not present

## 2018-12-23 DIAGNOSIS — R441 Visual hallucinations: Secondary | ICD-10-CM | POA: Diagnosis not present

## 2018-12-23 DIAGNOSIS — E441 Mild protein-calorie malnutrition: Secondary | ICD-10-CM | POA: Diagnosis not present

## 2018-12-23 DIAGNOSIS — K219 Gastro-esophageal reflux disease without esophagitis: Secondary | ICD-10-CM | POA: Diagnosis not present

## 2019-01-06 DIAGNOSIS — F39 Unspecified mood [affective] disorder: Secondary | ICD-10-CM | POA: Diagnosis not present

## 2019-01-14 DIAGNOSIS — G20 Parkinson's disease: Secondary | ICD-10-CM | POA: Diagnosis not present

## 2019-01-14 DIAGNOSIS — R2681 Unsteadiness on feet: Secondary | ICD-10-CM | POA: Diagnosis not present

## 2019-01-15 DIAGNOSIS — R2681 Unsteadiness on feet: Secondary | ICD-10-CM | POA: Diagnosis not present

## 2019-01-15 DIAGNOSIS — G20 Parkinson's disease: Secondary | ICD-10-CM | POA: Diagnosis not present

## 2019-01-16 DIAGNOSIS — G20 Parkinson's disease: Secondary | ICD-10-CM | POA: Diagnosis not present

## 2019-01-16 DIAGNOSIS — R2681 Unsteadiness on feet: Secondary | ICD-10-CM | POA: Diagnosis not present

## 2019-01-19 DIAGNOSIS — R2681 Unsteadiness on feet: Secondary | ICD-10-CM | POA: Diagnosis not present

## 2019-01-19 DIAGNOSIS — G20 Parkinson's disease: Secondary | ICD-10-CM | POA: Diagnosis not present

## 2019-01-20 DIAGNOSIS — G20 Parkinson's disease: Secondary | ICD-10-CM | POA: Diagnosis not present

## 2019-01-20 DIAGNOSIS — R2681 Unsteadiness on feet: Secondary | ICD-10-CM | POA: Diagnosis not present

## 2019-01-21 DIAGNOSIS — G20 Parkinson's disease: Secondary | ICD-10-CM | POA: Diagnosis not present

## 2019-01-21 DIAGNOSIS — R2681 Unsteadiness on feet: Secondary | ICD-10-CM | POA: Diagnosis not present

## 2019-01-22 DIAGNOSIS — R2681 Unsteadiness on feet: Secondary | ICD-10-CM | POA: Diagnosis not present

## 2019-01-22 DIAGNOSIS — G20 Parkinson's disease: Secondary | ICD-10-CM | POA: Diagnosis not present

## 2019-01-23 DIAGNOSIS — R2681 Unsteadiness on feet: Secondary | ICD-10-CM | POA: Diagnosis not present

## 2019-01-23 DIAGNOSIS — G20 Parkinson's disease: Secondary | ICD-10-CM | POA: Diagnosis not present

## 2019-01-26 DIAGNOSIS — G20 Parkinson's disease: Secondary | ICD-10-CM | POA: Diagnosis not present

## 2019-01-26 DIAGNOSIS — R2681 Unsteadiness on feet: Secondary | ICD-10-CM | POA: Diagnosis not present

## 2019-01-27 DIAGNOSIS — R2681 Unsteadiness on feet: Secondary | ICD-10-CM | POA: Diagnosis not present

## 2019-01-27 DIAGNOSIS — F39 Unspecified mood [affective] disorder: Secondary | ICD-10-CM | POA: Diagnosis not present

## 2019-01-27 DIAGNOSIS — G20 Parkinson's disease: Secondary | ICD-10-CM | POA: Diagnosis not present

## 2019-01-27 DIAGNOSIS — F0390 Unspecified dementia without behavioral disturbance: Secondary | ICD-10-CM | POA: Diagnosis not present

## 2019-01-27 DIAGNOSIS — K219 Gastro-esophageal reflux disease without esophagitis: Secondary | ICD-10-CM | POA: Diagnosis not present

## 2019-01-27 DIAGNOSIS — E441 Mild protein-calorie malnutrition: Secondary | ICD-10-CM

## 2019-01-28 DIAGNOSIS — R2681 Unsteadiness on feet: Secondary | ICD-10-CM | POA: Diagnosis not present

## 2019-01-28 DIAGNOSIS — G20 Parkinson's disease: Secondary | ICD-10-CM | POA: Diagnosis not present

## 2019-01-29 DIAGNOSIS — R2681 Unsteadiness on feet: Secondary | ICD-10-CM | POA: Diagnosis not present

## 2019-01-29 DIAGNOSIS — G20 Parkinson's disease: Secondary | ICD-10-CM | POA: Diagnosis not present

## 2019-01-30 DIAGNOSIS — R2681 Unsteadiness on feet: Secondary | ICD-10-CM | POA: Diagnosis not present

## 2019-01-30 DIAGNOSIS — G20 Parkinson's disease: Secondary | ICD-10-CM | POA: Diagnosis not present

## 2019-02-02 DIAGNOSIS — G20 Parkinson's disease: Secondary | ICD-10-CM | POA: Diagnosis not present

## 2019-02-02 DIAGNOSIS — R2681 Unsteadiness on feet: Secondary | ICD-10-CM | POA: Diagnosis not present

## 2019-02-03 DIAGNOSIS — R2681 Unsteadiness on feet: Secondary | ICD-10-CM | POA: Diagnosis not present

## 2019-02-03 DIAGNOSIS — G20 Parkinson's disease: Secondary | ICD-10-CM | POA: Diagnosis not present

## 2019-02-04 DIAGNOSIS — R2681 Unsteadiness on feet: Secondary | ICD-10-CM | POA: Diagnosis not present

## 2019-02-04 DIAGNOSIS — G20 Parkinson's disease: Secondary | ICD-10-CM | POA: Diagnosis not present

## 2019-02-10 DIAGNOSIS — B342 Coronavirus infection, unspecified: Secondary | ICD-10-CM | POA: Diagnosis not present

## 2019-02-12 DIAGNOSIS — G20 Parkinson's disease: Secondary | ICD-10-CM | POA: Diagnosis not present

## 2019-02-12 DIAGNOSIS — R262 Difficulty in walking, not elsewhere classified: Secondary | ICD-10-CM | POA: Diagnosis not present

## 2019-02-16 ENCOUNTER — Other Ambulatory Visit: Payer: Self-pay

## 2019-02-20 DIAGNOSIS — G20 Parkinson's disease: Secondary | ICD-10-CM

## 2019-02-20 DIAGNOSIS — E43 Unspecified severe protein-calorie malnutrition: Secondary | ICD-10-CM

## 2019-02-20 DIAGNOSIS — R441 Visual hallucinations: Secondary | ICD-10-CM

## 2019-02-20 DIAGNOSIS — K219 Gastro-esophageal reflux disease without esophagitis: Secondary | ICD-10-CM

## 2019-02-20 DIAGNOSIS — F39 Unspecified mood [affective] disorder: Secondary | ICD-10-CM

## 2019-02-20 DIAGNOSIS — G309 Alzheimer's disease, unspecified: Secondary | ICD-10-CM

## 2019-03-19 DIAGNOSIS — F39 Unspecified mood [affective] disorder: Secondary | ICD-10-CM | POA: Diagnosis not present

## 2019-03-19 DIAGNOSIS — F29 Unspecified psychosis not due to a substance or known physiological condition: Secondary | ICD-10-CM | POA: Diagnosis not present

## 2019-03-19 DIAGNOSIS — F0280 Dementia in other diseases classified elsewhere without behavioral disturbance: Secondary | ICD-10-CM | POA: Diagnosis not present

## 2019-03-19 DIAGNOSIS — G20 Parkinson's disease: Secondary | ICD-10-CM | POA: Diagnosis not present

## 2019-03-19 DIAGNOSIS — E441 Mild protein-calorie malnutrition: Secondary | ICD-10-CM

## 2019-03-19 DIAGNOSIS — K219 Gastro-esophageal reflux disease without esophagitis: Secondary | ICD-10-CM

## 2019-03-26 DIAGNOSIS — Z03818 Encounter for observation for suspected exposure to other biological agents ruled out: Secondary | ICD-10-CM | POA: Diagnosis not present

## 2019-04-15 DIAGNOSIS — R251 Tremor, unspecified: Secondary | ICD-10-CM | POA: Diagnosis not present

## 2019-04-15 DIAGNOSIS — G20 Parkinson's disease: Secondary | ICD-10-CM | POA: Diagnosis not present

## 2019-04-15 DIAGNOSIS — R441 Visual hallucinations: Secondary | ICD-10-CM | POA: Diagnosis not present

## 2019-04-15 DIAGNOSIS — R262 Difficulty in walking, not elsewhere classified: Secondary | ICD-10-CM | POA: Diagnosis not present

## 2019-04-17 DIAGNOSIS — Z03818 Encounter for observation for suspected exposure to other biological agents ruled out: Secondary | ICD-10-CM | POA: Diagnosis not present

## 2019-04-28 DIAGNOSIS — Z03818 Encounter for observation for suspected exposure to other biological agents ruled out: Secondary | ICD-10-CM | POA: Diagnosis not present

## 2019-05-01 DIAGNOSIS — Z03818 Encounter for observation for suspected exposure to other biological agents ruled out: Secondary | ICD-10-CM | POA: Diagnosis not present

## 2019-05-05 DIAGNOSIS — R2681 Unsteadiness on feet: Secondary | ICD-10-CM | POA: Diagnosis not present

## 2019-05-05 DIAGNOSIS — G20 Parkinson's disease: Secondary | ICD-10-CM | POA: Diagnosis not present

## 2019-05-06 DIAGNOSIS — R2681 Unsteadiness on feet: Secondary | ICD-10-CM | POA: Diagnosis not present

## 2019-05-06 DIAGNOSIS — G20 Parkinson's disease: Secondary | ICD-10-CM | POA: Diagnosis not present

## 2019-05-07 DIAGNOSIS — R2681 Unsteadiness on feet: Secondary | ICD-10-CM | POA: Diagnosis not present

## 2019-05-07 DIAGNOSIS — G20 Parkinson's disease: Secondary | ICD-10-CM | POA: Diagnosis not present

## 2019-05-08 DIAGNOSIS — R2681 Unsteadiness on feet: Secondary | ICD-10-CM | POA: Diagnosis not present

## 2019-05-08 DIAGNOSIS — G20 Parkinson's disease: Secondary | ICD-10-CM | POA: Diagnosis not present

## 2019-05-11 DIAGNOSIS — R2681 Unsteadiness on feet: Secondary | ICD-10-CM | POA: Diagnosis not present

## 2019-05-11 DIAGNOSIS — G20 Parkinson's disease: Secondary | ICD-10-CM | POA: Diagnosis not present

## 2019-05-12 DIAGNOSIS — R2681 Unsteadiness on feet: Secondary | ICD-10-CM | POA: Diagnosis not present

## 2019-05-12 DIAGNOSIS — G20 Parkinson's disease: Secondary | ICD-10-CM | POA: Diagnosis not present

## 2019-05-13 DIAGNOSIS — G20 Parkinson's disease: Secondary | ICD-10-CM | POA: Diagnosis not present

## 2019-05-13 DIAGNOSIS — R2681 Unsteadiness on feet: Secondary | ICD-10-CM | POA: Diagnosis not present

## 2019-05-14 DIAGNOSIS — G20 Parkinson's disease: Secondary | ICD-10-CM | POA: Diagnosis not present

## 2019-05-14 DIAGNOSIS — R2681 Unsteadiness on feet: Secondary | ICD-10-CM | POA: Diagnosis not present

## 2019-05-15 DIAGNOSIS — R2681 Unsteadiness on feet: Secondary | ICD-10-CM | POA: Diagnosis not present

## 2019-05-15 DIAGNOSIS — G20 Parkinson's disease: Secondary | ICD-10-CM | POA: Diagnosis not present

## 2019-05-15 DIAGNOSIS — Z03818 Encounter for observation for suspected exposure to other biological agents ruled out: Secondary | ICD-10-CM | POA: Diagnosis not present

## 2019-05-19 DIAGNOSIS — Z03818 Encounter for observation for suspected exposure to other biological agents ruled out: Secondary | ICD-10-CM | POA: Diagnosis not present

## 2019-05-27 DIAGNOSIS — E43 Unspecified severe protein-calorie malnutrition: Secondary | ICD-10-CM

## 2019-05-27 DIAGNOSIS — G20 Parkinson's disease: Secondary | ICD-10-CM | POA: Diagnosis not present

## 2019-05-27 DIAGNOSIS — F29 Unspecified psychosis not due to a substance or known physiological condition: Secondary | ICD-10-CM | POA: Diagnosis not present

## 2019-05-27 DIAGNOSIS — F39 Unspecified mood [affective] disorder: Secondary | ICD-10-CM | POA: Diagnosis not present

## 2019-05-27 DIAGNOSIS — F0150 Vascular dementia without behavioral disturbance: Secondary | ICD-10-CM | POA: Diagnosis not present

## 2019-05-27 DIAGNOSIS — K219 Gastro-esophageal reflux disease without esophagitis: Secondary | ICD-10-CM

## 2019-08-01 DIAGNOSIS — R300 Dysuria: Secondary | ICD-10-CM | POA: Diagnosis not present

## 2019-08-03 DIAGNOSIS — N3000 Acute cystitis without hematuria: Secondary | ICD-10-CM | POA: Diagnosis not present

## 2019-08-10 DIAGNOSIS — G20 Parkinson's disease: Secondary | ICD-10-CM | POA: Diagnosis not present

## 2019-08-11 DIAGNOSIS — G20 Parkinson's disease: Secondary | ICD-10-CM | POA: Diagnosis not present

## 2019-08-11 DIAGNOSIS — F39 Unspecified mood [affective] disorder: Secondary | ICD-10-CM | POA: Diagnosis not present

## 2019-08-11 DIAGNOSIS — G3183 Dementia with Lewy bodies: Secondary | ICD-10-CM | POA: Diagnosis not present

## 2019-08-11 DIAGNOSIS — E441 Mild protein-calorie malnutrition: Secondary | ICD-10-CM | POA: Diagnosis not present

## 2019-08-24 DIAGNOSIS — Z23 Encounter for immunization: Secondary | ICD-10-CM | POA: Diagnosis not present

## 2019-09-21 DIAGNOSIS — Z23 Encounter for immunization: Secondary | ICD-10-CM | POA: Diagnosis not present

## 2019-09-30 DIAGNOSIS — F39 Unspecified mood [affective] disorder: Secondary | ICD-10-CM | POA: Diagnosis not present

## 2019-09-30 DIAGNOSIS — K219 Gastro-esophageal reflux disease without esophagitis: Secondary | ICD-10-CM | POA: Diagnosis not present

## 2019-09-30 DIAGNOSIS — G20 Parkinson's disease: Secondary | ICD-10-CM | POA: Diagnosis not present

## 2019-09-30 DIAGNOSIS — E43 Unspecified severe protein-calorie malnutrition: Secondary | ICD-10-CM | POA: Diagnosis not present

## 2019-09-30 DIAGNOSIS — F0280 Dementia in other diseases classified elsewhere without behavioral disturbance: Secondary | ICD-10-CM

## 2019-10-03 IMAGING — US US THYROID
1 series · 13 of 25 positions shown · non-contrast
Comparison: 05/14/2012

CLINICAL DATA: Thyroid nodule by recent CT

EXAM:
THYROID ULTRASOUND
TECHNIQUE: Ultrasound examination of the thyroid gland and adjacent soft
tissues was performed.

[Series 1: us thyroid · 13 of 67 slices shown]
[im 1/67]
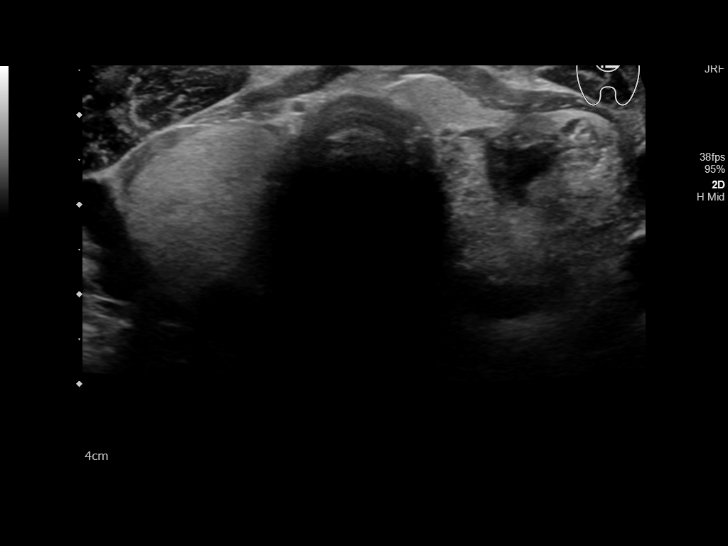
[im 6/67]
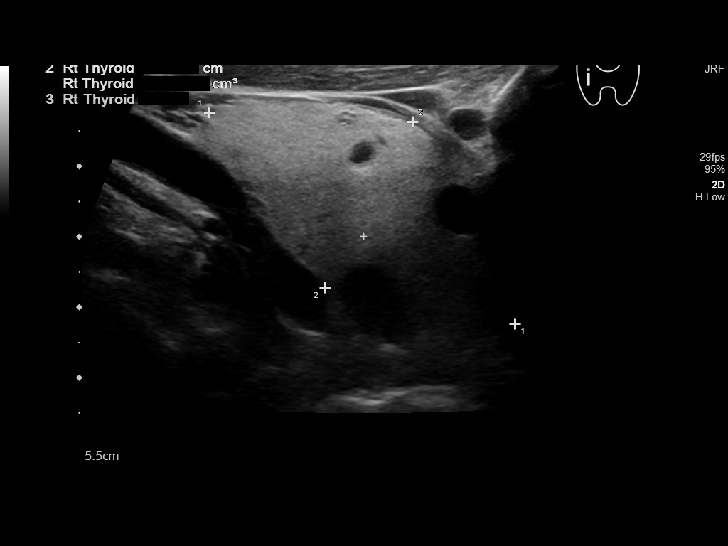
[im 12/67]
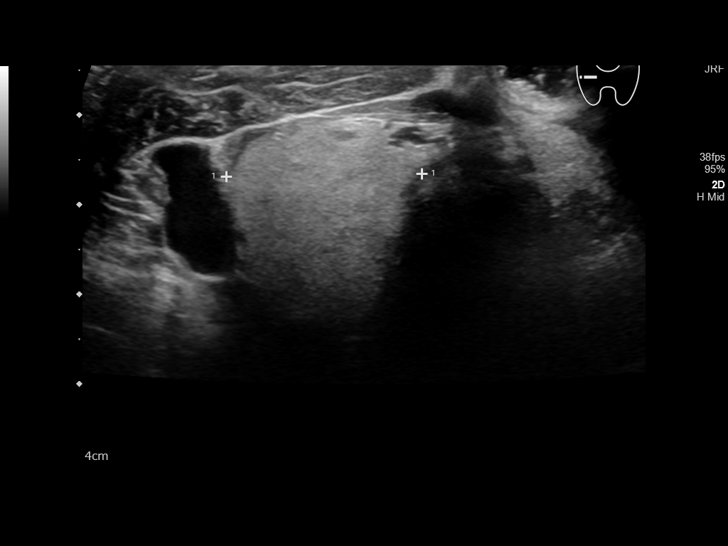
[im 17/67]
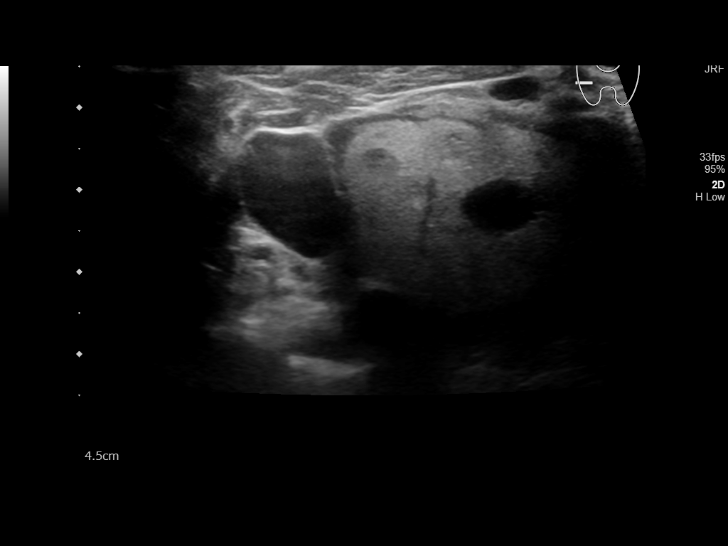
[im 23/67]
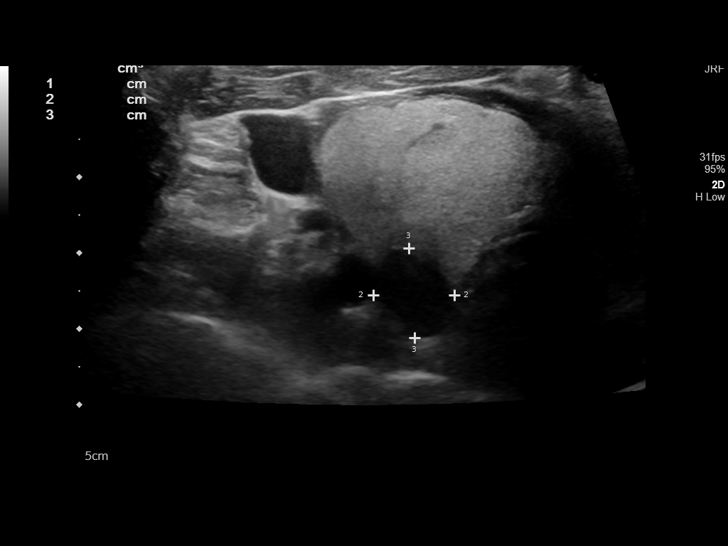
[im 28/67]
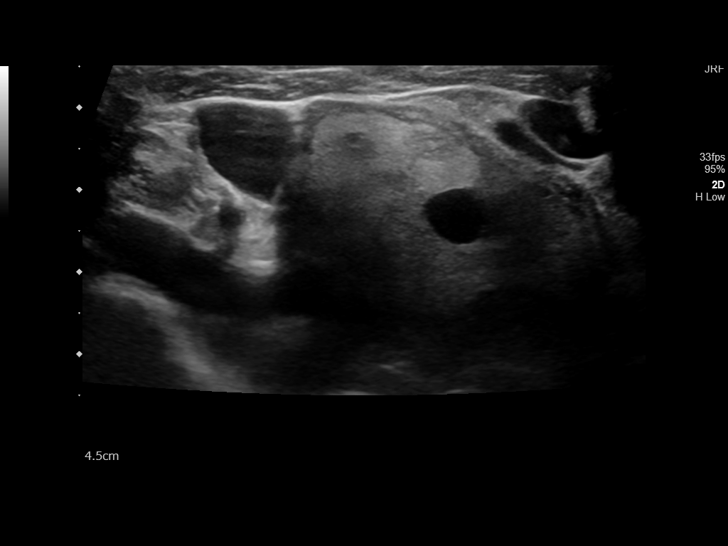
[im 34/67]
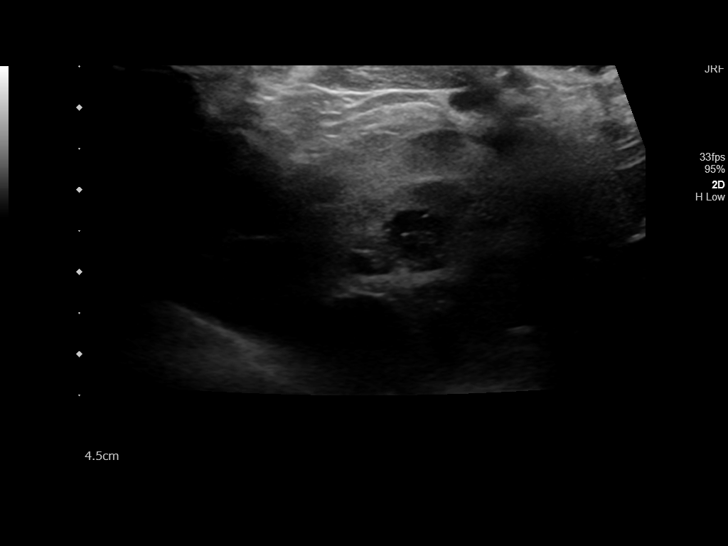
[im 39/67]
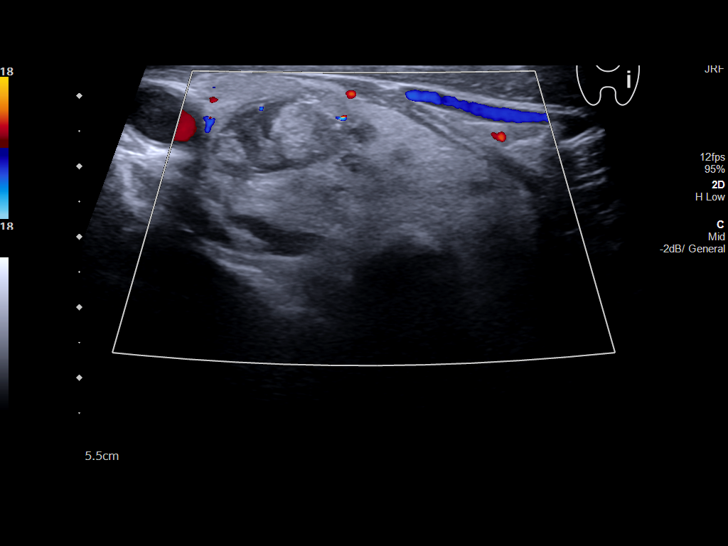
[im 45/67]
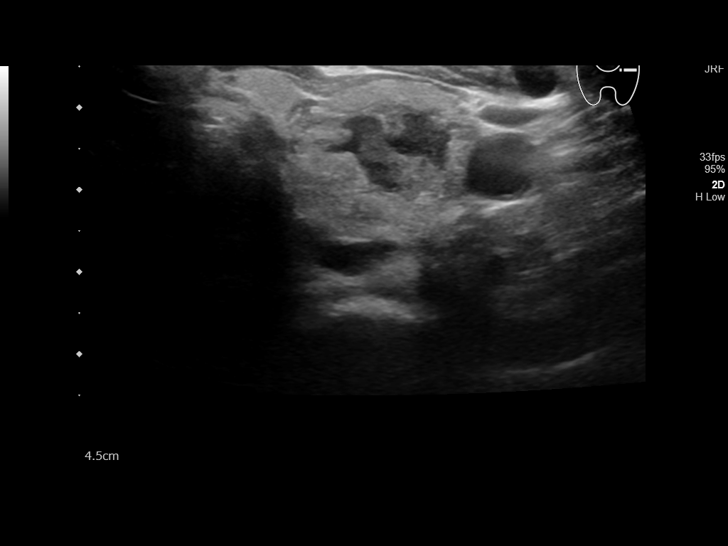
[im 50/67]
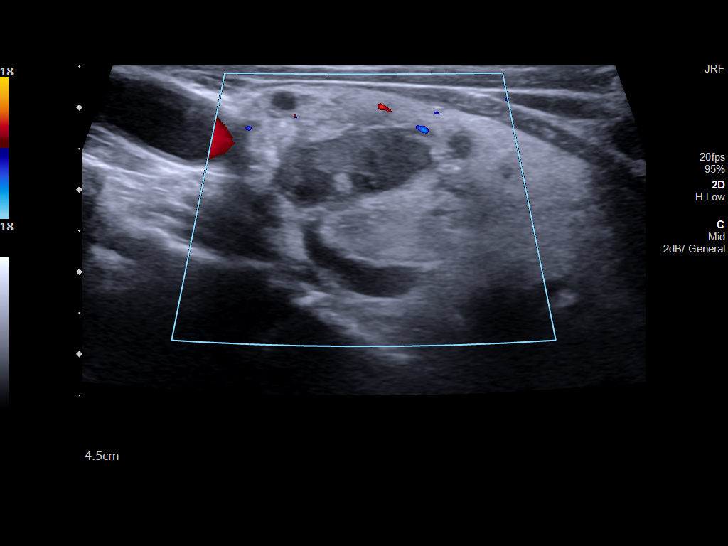
[im 56/67]
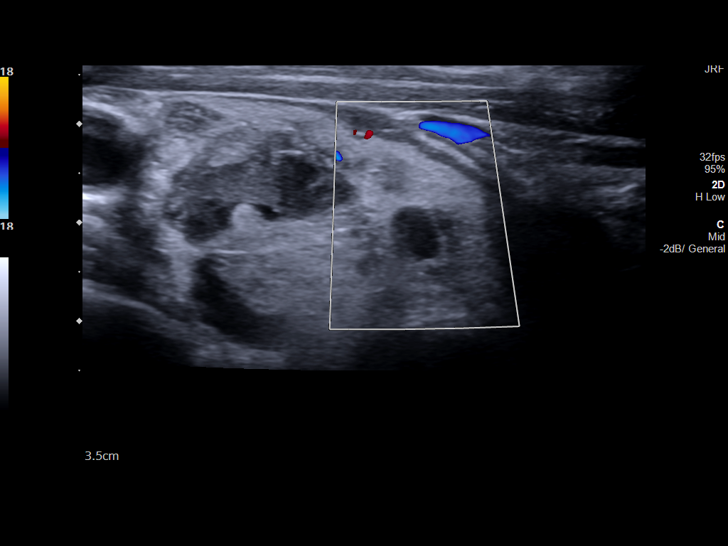
[im 61/67]
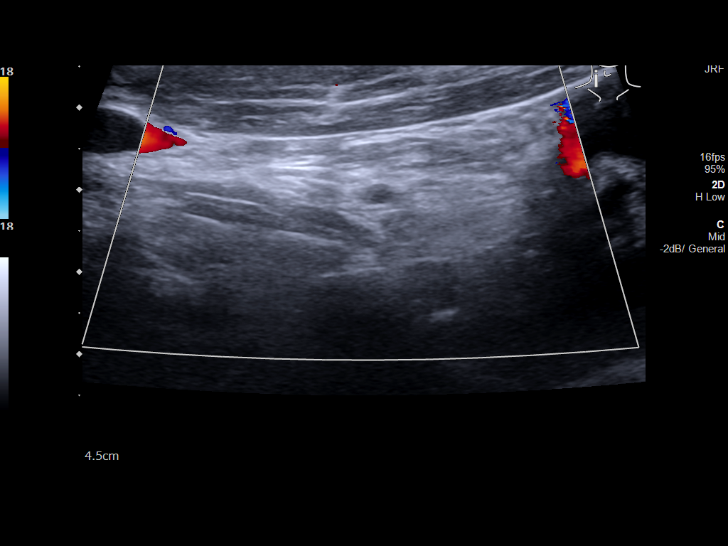
[im 67/67]
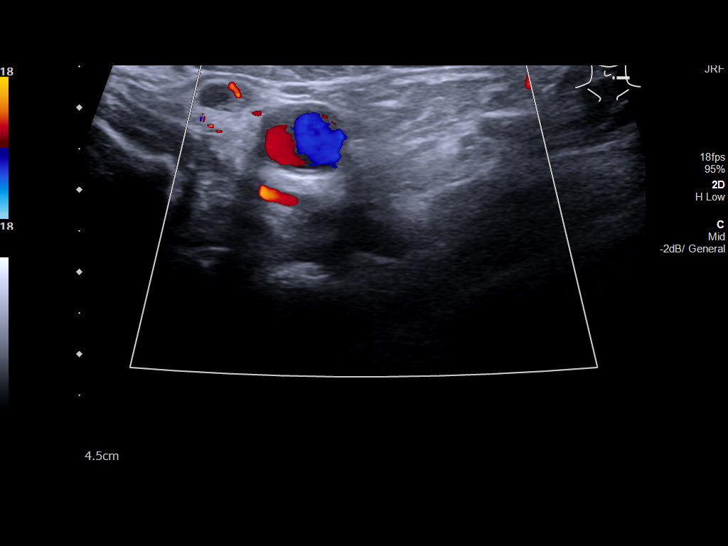

[13 of 25 positions shown; findings below may reference images not displayed]

FINDINGS: Parenchymal Echotexture: Mildly heterogenous

Isthmus: 5 mm

Right lobe: 5.3 x 2.7 x 2.2 cm, previously 5.4 x 2.7 x 2.6 cm

Left lobe: 5.6 x 3.1 x 2.6 cm, previously 5.3 x 2.2 x 2.1 cm

_________________________________________________________

Estimated total number of nodules >/= 1 cm: 3

Number of spongiform nodules >/=  2 cm not described below (TR1): 0

Number of mixed cystic and solid nodules >/= 1.5 cm not described
below (TR2): 0

_________________________________________________________

Nodule # 1:

Location: Right; Inferior

Maximum size: 1.4 cm; Other 2 dimensions: 1.1 x 1.2 cm

Composition: cannot determine (2)

Echogenicity: hypoechoic (2)

Shape: not taller-than-wide (0)

Margins: ill-defined (0)

Echogenic foci: none (0)

ACR TI-RADS total points: 4.

ACR TI-RADS risk category: TR4 (4-6 points).

ACR TI-RADS recommendations:

*Given size (>/= 1 - 1.4 cm) and appearance, a follow-up ultrasound
in 1 year should be considered based on TI-RADS criteria.

_________________________________________________________

Nodule # 3:

Location: Right; Inferior

Maximum size: 1.3, previously 1.6 cm; Other 2 dimensions: 0.9 x 0.8,
unchanged cm

Composition: mixed cystic and solid (1)

Echogenicity: isoechoic (1)

Shape: not taller-than-wide (0)

Margins: smooth (0)

Echogenic foci: none (0)

ACR TI-RADS total points: 2.

ACR TI-RADS risk category: TR2 (2 points).

ACR TI-RADS recommendations:

This nodule does NOT meet TI-RADS criteria for biopsy or dedicated
follow-up.

_________________________________________________________

Nodule # 4:

Location: Left; Superior

Maximum size: 2.7 cm; Other 2 dimensions: 2.4 x 2.4 cm

Composition: solid/almost completely solid (2)

Echogenicity: hypoechoic (2)

Shape: not taller-than-wide (0)

Margins: lobulated/irregular (2)

Echogenic foci: none (0)

ACR TI-RADS total points: 6.

ACR TI-RADS risk category: TR4 (4-6 points).

ACR TI-RADS recommendations:

**Given size (>/= 1.5 cm) and appearance, fine needle aspiration of
this moderately suspicious nodule should be considered based on
TI-RADS criteria.

_________________________________________________________

There are additional bilateral subcentimeter cystic nodules all
measuring 8 mm or less in size. These would not meet criteria for
any biopsy or follow-up.

No regional adenopathy.
IMPRESSION: 2.7 cm left superior TR 4 nodule meets criteria for biopsy as above

The above is in keeping with the ACR TI-RADS recommendations - [HOSPITAL] 9678;[DATE].

## 2019-11-17 DIAGNOSIS — R05 Cough: Secondary | ICD-10-CM | POA: Diagnosis not present

## 2019-11-26 DIAGNOSIS — E441 Mild protein-calorie malnutrition: Secondary | ICD-10-CM | POA: Diagnosis not present

## 2019-11-26 DIAGNOSIS — G3183 Dementia with Lewy bodies: Secondary | ICD-10-CM | POA: Diagnosis not present

## 2019-11-26 DIAGNOSIS — F39 Unspecified mood [affective] disorder: Secondary | ICD-10-CM | POA: Diagnosis not present

## 2019-11-26 DIAGNOSIS — G20 Parkinson's disease: Secondary | ICD-10-CM | POA: Diagnosis not present

## 2019-12-26 DIAGNOSIS — M25552 Pain in left hip: Secondary | ICD-10-CM | POA: Diagnosis not present

## 2019-12-26 DIAGNOSIS — W19XXXA Unspecified fall, initial encounter: Secondary | ICD-10-CM | POA: Diagnosis not present

## 2020-01-29 DIAGNOSIS — K219 Gastro-esophageal reflux disease without esophagitis: Secondary | ICD-10-CM | POA: Diagnosis not present

## 2020-01-29 DIAGNOSIS — G20 Parkinson's disease: Secondary | ICD-10-CM | POA: Diagnosis not present

## 2020-01-29 DIAGNOSIS — F0280 Dementia in other diseases classified elsewhere without behavioral disturbance: Secondary | ICD-10-CM | POA: Diagnosis not present

## 2020-01-29 DIAGNOSIS — F39 Unspecified mood [affective] disorder: Secondary | ICD-10-CM | POA: Diagnosis not present

## 2020-01-29 DIAGNOSIS — E43 Unspecified severe protein-calorie malnutrition: Secondary | ICD-10-CM | POA: Diagnosis not present

## 2020-01-30 ENCOUNTER — Other Ambulatory Visit: Payer: Self-pay

## 2020-01-30 ENCOUNTER — Emergency Department
Admission: EM | Admit: 2020-01-30 | Discharge: 2020-01-30 | Disposition: A | Discharge status: Home / Self Care | Payer: Medicare Other | Attending: Emergency Medicine | Admitting: Emergency Medicine

## 2020-01-30 ENCOUNTER — Encounter: Payer: Self-pay | Admitting: Emergency Medicine

## 2020-01-30 ENCOUNTER — Emergency Department: Payer: Medicare Other

## 2020-01-30 DIAGNOSIS — G20 Parkinson's disease: Secondary | ICD-10-CM | POA: Diagnosis not present

## 2020-01-30 DIAGNOSIS — R079 Chest pain, unspecified: Secondary | ICD-10-CM | POA: Insufficient documentation

## 2020-01-30 DIAGNOSIS — R0789 Other chest pain: Secondary | ICD-10-CM | POA: Diagnosis not present

## 2020-01-30 DIAGNOSIS — F0390 Unspecified dementia without behavioral disturbance: Secondary | ICD-10-CM | POA: Insufficient documentation

## 2020-01-30 DIAGNOSIS — R531 Weakness: Secondary | ICD-10-CM | POA: Diagnosis not present

## 2020-01-30 DIAGNOSIS — I10 Essential (primary) hypertension: Secondary | ICD-10-CM | POA: Diagnosis not present

## 2020-01-30 DIAGNOSIS — Z79899 Other long term (current) drug therapy: Secondary | ICD-10-CM | POA: Diagnosis not present

## 2020-01-30 DIAGNOSIS — R911 Solitary pulmonary nodule: Secondary | ICD-10-CM | POA: Diagnosis not present

## 2020-01-30 LAB — TROPONIN I (HIGH SENSITIVITY)
Troponin I (High Sensitivity): 4 ng/L (ref <18)
Troponin I (High Sensitivity): 5 ng/L (ref <18)

## 2020-01-30 LAB — COMPREHENSIVE METABOLIC PANEL
ALT: 11 U/L (ref 0–44)
AST: 19 U/L (ref 15–41)
Albumin: 3.9 g/dL (ref 3.5–5.0)
Alkaline Phosphatase: 48 U/L (ref 38–126)
Anion gap: 9 (ref 5–15)
BUN: 24 mg/dL — ABNORMAL HIGH (ref 8–23)
CO2: 25 mmol/L (ref 22–32)
Calcium: 9.1 mg/dL (ref 8.9–10.3)
Chloride: 106 mmol/L (ref 98–111)
Creatinine, Ser: 0.67 mg/dL (ref 0.44–1.00)
GFR calc Af Amer: >60 mL/min (ref >60)
GFR calc non Af Amer: >60 mL/min (ref >60)
Glucose, Bld: 91 mg/dL (ref 70–99)
Potassium: 4 mmol/L (ref 3.5–5.1)
Sodium: 140 mmol/L (ref 135–145)
Total Bilirubin: 1 mg/dL (ref 0.3–1.2)
Total Protein: 6.3 g/dL — ABNORMAL LOW (ref 6.5–8.1)

## 2020-01-30 LAB — CBC
HCT: 39.2 % (ref 36.0–46.0)
Hemoglobin: 13 g/dL (ref 12.0–15.0)
MCH: 31.7 pg (ref 26.0–34.0)
MCHC: 33.2 g/dL (ref 30.0–36.0)
MCV: 95.6 fL (ref 80.0–100.0)
Platelets: 162 10*3/uL (ref 150–400)
RBC: 4.1 MIL/uL (ref 3.87–5.11)
RDW: 13.7 % (ref 11.5–15.5)
WBC: 5 10*3/uL (ref 4.0–10.5)
nRBC: 0 % (ref 0.0–0.2)

## 2020-01-30 NOTE — ED Notes (Signed)
Pt placed in son's POV, DC instructions given to son as well.

## 2020-01-30 NOTE — ED Triage Notes (Signed)
Pt arrived via [facility] from [facility] c/o diffuse chest pain that started 0730 while eating breakfast. Pt received NTG at facility.  EMS admin 1 in. NTG paste and 324 aspirin pta. EKG unremarkable per EMS. BP 129/83, P 83, O2 100% and BG 115.

## 2020-01-30 NOTE — ED Notes (Signed)
Pt's son at bedside at this time.

## 2020-01-30 NOTE — ED Provider Notes (Signed)
[facility] Emergency Department Provider Note  Time seen: 9:20 AM  I have reviewed the triage vital signs and the nursing notes.   HISTORY  Chief Complaint Chest Pain   HPI Cheryl Cummings is a 79 y.o. female with a past medical history of hypertension, gastric reflux, Parkinson's, dementia, presents to the emergency department from [facility] with chest pain. According to the patient and record review patient states around 7:00 this morning she developed a central chest discomfort. Patient denies any nausea diaphoresis does state slight shortness of breath but states that has resolved. Patient denies any discomfort currently. Denies any chest pain or trouble breathing. Patient did receive nitroglycerin and 324 of aspirin per report.  Past Medical History:  Diagnosis Date  . Essential hypertension, benign   . Generalized osteoarthritis of multiple sites    mild  . GERD (gastroesophageal reflux disease)   . Glaucoma   . Parkinson disease [facility])     Patient Active Problem List   Diagnosis Date Noted  . Dementia (HCC) 12/18/2018  . Parkinson disease (HCC) 03/14/2017  . Malnutrition of mild degree (HCC) 03/14/2017  . GERD (gastroesophageal reflux disease)   . Glaucoma   . Generalized osteoarthritis of multiple sites     Past Surgical History:  Procedure Laterality Date  . VAGINAL DELIVERY     twice    Prior to Admission medications   Medication Sig Start Date End Date Taking? Authorizing Provider  acetaminophen (TYLENOL) 325 MG tablet Take 325-650 mg by mouth every 4 (four) hours as needed for mild pain or fever.    [provider]  alum & mag hydroxide-simeth (MINTOX) 200-200-20 MG/5ML suspension Take 30 mLs by mouth every 6 (six) hours as needed for indigestion or heartburn.    [provider]  bismuth subsalicylate (KAOPECTATE) 262 MG/15ML suspension Take 10 mLs by mouth every 6 (six) hours as needed for indigestion or diarrhea  or loose stools.    [provider]  carbamide peroxide (DEBROX) 6.5 % OTIC solution Place 5 drops into both ears 2 (two) times daily as needed (wax buildup).    [provider]  Carbidopa-Levodopa ER (SINEMET CR) 25-100 MG tablet controlled release Take 1 tablet by mouth 2 (two) times daily.    [provider]  cetirizine (ZYRTEC) 10 MG tablet Take 10 mg by mouth daily as needed for allergies.    [provider]  dextromethorphan-guaiFENesin (TUSSIN DM) 10-100 MG/5ML liquid Take 10 mLs by mouth every 4 (four) hours as needed for cough.    [provider]  diphenhydrAMINE (BENADRYL) 25 MG tablet Take 25 mg by mouth every 6 (six) hours as needed for itching or allergies.    [provider]  dorzolamide-timolol (COSOPT) 22.3-6.8 MG/ML ophthalmic solution Place 1 drop into both eyes 2 (two) times daily.    [provider]  famotidine (PEPCID) 20 MG tablet Take 1 tablet (20 mg total) by mouth 2 (two) times daily. Patient taking differently: Take 20 mg by mouth daily as needed for heartburn or indigestion.  09/06/16   Sharman Cheek, MD  magnesium hydroxide (MILK OF MAGNESIA) 400 MG/5ML suspension Take 30 mLs by mouth at bedtime as needed for mild constipation or moderate constipation.    [provider]  Multiple Vitamins-Minerals (MULTIVITAMIN WITH MINERALS) tablet Take 1 tablet by mouth daily.    [provider]  ondansetron (ZOFRAN) 4 MG tablet Take 4 mg by mouth every 8 (eight) hours as needed for nausea  or vomiting.    [provider]  TRAVATAN Z 0.004 % SOLN ophthalmic solution Place 1 drop into both eyes at bedtime.    [provider]  Vitamin D, Ergocalciferol, (DRISDOL) 50000 units CAPS capsule Take 1 capsule (50,000 Units total) by mouth every 30 (thirty) days. Patient taking differently: Take 50,000 Units by mouth. Every 28 days 03/14/17   [name], MD    Allergies  Allergen Reactions   . Nsaids Anaphylaxis  . Diazepam Other (See Comments)    Unknown  Other reaction(s): Unknown Unknown Other Reaction: OTHER REACTION  . Ibuprofen Swelling  . Meperidine Other (See Comments)    Other Reaction: OTHER REACTION  . Mushroom Extract Complex Nausea And Vomiting  . Other Diarrhea    Sour Cream  . Shellfish Allergy Rash    History reviewed. No pertinent family history.  Social History Social History   Tobacco Use  . Smoking status: Never Smoker  . Smokeless tobacco: Never Used  Substance Use Topics  . Alcohol use: No  . Drug use: No    Review of Systems Constitutional: Negative for fever. Cardiovascular: Chest pain starting around 730 this morning. Now resolved Respiratory: Patient states slight shortness of breath at the time but none currently. Gastrointestinal: Negative for abdominal pain Musculoskeletal: Negative for musculoskeletal complaints Neurological: Negative for headache All other ROS negative  ____________________________________________   PHYSICAL EXAM:  VITAL SIGNS: ED Triage Vitals  Enc Vitals Group     BP 01/30/20 0856 (!) 149/93     Pulse Rate 01/30/20 0856 71     Resp 01/30/20 0856 16     Temp 01/30/20 0856 98 F (36.7 C)     Temp Source 01/30/20 0856 Oral     SpO2 01/30/20 0856 97 %     Weight 01/30/20 0857 120 lb (54.4 kg)     Height 01/30/20 0857 5\' 6"  (1.676 m)     Head Circumference --      Peak Flow --      Pain Score 01/30/20 0857 3     Pain Loc --      Pain Edu? --      Excl. in GC? --     Constitutional: Patient is awake alert, no acute distress. Calm cooperative. Eyes: Normal exam ENT      Head: Normocephalic and atraumatic.      Mouth/Throat: Mucous membranes are moist. Cardiovascular: Normal rate, regular rhythm. Respiratory: Normal respiratory effort without tachypnea nor retractions. Breath sounds are clear  Gastrointestinal: Soft and nontender. No distention.  Musculoskeletal: Nontender with normal range  of motion in all extremities.  Neurologic:  Normal speech and language. No gross focal neurologic deficits Skin:  Skin is warm, dry and intact.  Psychiatric: Mood and affect are normal.   ____________________________________________    EKG  EKG viewed and interpreted by myself shows a normal sinus rhythm at 77 bpm with a narrow QRS, normal axis, normal intervals, nonspecific ST changes.  ____________________________________________    RADIOLOGY  Chest x-ray is negative  ____________________________________________   INITIAL IMPRESSION / ASSESSMENT AND PLAN / ED COURSE  Pertinent labs & imaging results that were available during my care of the patient were reviewed by me and considered in my medical decision making (see chart for details).   Patient presents to the emergency department from her facility with complaints of chest pain starting around 730 this morning while eating breakfast. Per report patient received nitroglycerin and 324 aspirin prior to arrival. Here the  patient denies any chest pain. No shortness of breath nausea or diaphoresis. Overall the patient appears well. We'll check labs including cardiac enzymes x2, chest x-ray and continue to closely monitor. Patient agreeable to plan of care.  Patient's work-up is reassuring including negative troponin x2.  Son is now here.  We have gone over the patient's work-up, and they are both reassured.  We will discharge back to her nursing facility.  Cheryl Cummings was evaluated in Emergency Department on 01/30/2020 for the symptoms described in the history of present illness. She was evaluated in the context of the global COVID-19 pandemic, which necessitated consideration that the patient might be at risk for infection with the SARS-CoV-2 virus that causes COVID-19. Institutional protocols and algorithms that pertain to the evaluation of patients at risk for COVID-19 are in a state of rapid change based on information released  by regulatory bodies including the CDC and federal and state organizations. These policies and algorithms were followed during the patient's care in the ED.  ____________________________________________   FINAL CLINICAL IMPRESSION(S) / ED DIAGNOSES  Chest pain   Minna Antis, MD 01/30/20 1224

## 2020-02-01 DIAGNOSIS — R079 Chest pain, unspecified: Secondary | ICD-10-CM | POA: Diagnosis not present

## 2020-02-01 DIAGNOSIS — F22 Delusional disorders: Secondary | ICD-10-CM | POA: Diagnosis not present

## 2020-02-02 DIAGNOSIS — N390 Urinary tract infection, site not specified: Secondary | ICD-10-CM | POA: Diagnosis not present

## 2020-02-02 DIAGNOSIS — F22 Delusional disorders: Secondary | ICD-10-CM | POA: Diagnosis not present

## 2020-03-07 ENCOUNTER — Inpatient Hospital Stay
Admission: EM | Admit: 2020-03-07 | Discharge: 2020-03-09 | DRG: 340 | Disposition: A | Discharge status: Skilled Nursing Facility | Payer: Medicare Other | Attending: Surgery | Admitting: Surgery

## 2020-03-07 ENCOUNTER — Encounter: Payer: Self-pay | Admitting: Emergency Medicine

## 2020-03-07 ENCOUNTER — Emergency Department: Payer: Medicare Other

## 2020-03-07 ENCOUNTER — Other Ambulatory Visit: Payer: Self-pay

## 2020-03-07 ENCOUNTER — Inpatient Hospital Stay: Payer: Medicare Other | Admitting: Anesthesiology

## 2020-03-07 ENCOUNTER — Encounter
Admission: EM | Disposition: A | Discharge status: Skilled Nursing Facility | Payer: Medicare Other | Source: Home / Self Care | Attending: Surgery

## 2020-03-07 DIAGNOSIS — R52 Pain, unspecified: Secondary | ICD-10-CM | POA: Diagnosis not present

## 2020-03-07 DIAGNOSIS — K3580 Unspecified acute appendicitis: Secondary | ICD-10-CM | POA: Diagnosis not present

## 2020-03-07 DIAGNOSIS — R1084 Generalized abdominal pain: Secondary | ICD-10-CM | POA: Diagnosis not present

## 2020-03-07 DIAGNOSIS — Z886 Allergy status to analgesic agent status: Secondary | ICD-10-CM | POA: Diagnosis not present

## 2020-03-07 DIAGNOSIS — Z7401 Bed confinement status: Secondary | ICD-10-CM | POA: Diagnosis not present

## 2020-03-07 DIAGNOSIS — H409 Unspecified glaucoma: Secondary | ICD-10-CM | POA: Diagnosis present

## 2020-03-07 DIAGNOSIS — M2550 Pain in unspecified joint: Secondary | ICD-10-CM | POA: Diagnosis not present

## 2020-03-07 DIAGNOSIS — G20 Parkinson's disease: Secondary | ICD-10-CM | POA: Diagnosis present

## 2020-03-07 DIAGNOSIS — R1010 Upper abdominal pain, unspecified: Secondary | ICD-10-CM | POA: Diagnosis not present

## 2020-03-07 DIAGNOSIS — K219 Gastro-esophageal reflux disease without esophagitis: Secondary | ICD-10-CM | POA: Diagnosis present

## 2020-03-07 DIAGNOSIS — Z888 Allergy status to other drugs, medicaments and biological substances status: Secondary | ICD-10-CM

## 2020-03-07 DIAGNOSIS — Z91013 Allergy to seafood: Secondary | ICD-10-CM

## 2020-03-07 DIAGNOSIS — F0280 Dementia in other diseases classified elsewhere without behavioral disturbance: Secondary | ICD-10-CM | POA: Diagnosis present

## 2020-03-07 DIAGNOSIS — Z20822 Contact with and (suspected) exposure to covid-19: Secondary | ICD-10-CM | POA: Diagnosis present

## 2020-03-07 DIAGNOSIS — K3532 Acute appendicitis with perforation and localized peritonitis, without abscess: Secondary | ICD-10-CM | POA: Diagnosis present

## 2020-03-07 DIAGNOSIS — I10 Essential (primary) hypertension: Secondary | ICD-10-CM | POA: Diagnosis present

## 2020-03-07 DIAGNOSIS — K381 Appendicular concretions: Secondary | ICD-10-CM | POA: Diagnosis present

## 2020-03-07 DIAGNOSIS — K3589 Other acute appendicitis without perforation or gangrene: Secondary | ICD-10-CM | POA: Diagnosis not present

## 2020-03-07 DIAGNOSIS — R5381 Other malaise: Secondary | ICD-10-CM | POA: Diagnosis not present

## 2020-03-07 HISTORY — PX: LAPAROSCOPIC APPENDECTOMY: SHX408

## 2020-03-07 LAB — COMPREHENSIVE METABOLIC PANEL
ALT: 12 U/L (ref 0–44)
AST: 20 U/L (ref 15–41)
Albumin: 4.3 g/dL (ref 3.5–5.0)
Alkaline Phosphatase: 59 U/L (ref 38–126)
Anion gap: 10 (ref 5–15)
BUN: 29 mg/dL — ABNORMAL HIGH (ref 8–23)
CO2: 27 mmol/L (ref 22–32)
Calcium: 9.3 mg/dL (ref 8.9–10.3)
Chloride: 104 mmol/L (ref 98–111)
Creatinine, Ser: 0.65 mg/dL (ref 0.44–1.00)
GFR calc Af Amer: >60 mL/min (ref >60)
GFR calc non Af Amer: >60 mL/min (ref >60)
Glucose, Bld: 115 mg/dL — ABNORMAL HIGH (ref 70–99)
Potassium: 3.7 mmol/L (ref 3.5–5.1)
Sodium: 141 mmol/L (ref 135–145)
Total Bilirubin: 1 mg/dL (ref 0.3–1.2)
Total Protein: 7.3 g/dL (ref 6.5–8.1)

## 2020-03-07 LAB — LIPASE, BLOOD: Lipase: 29 U/L (ref 11–51)

## 2020-03-07 LAB — CBC
HCT: 44.9 % (ref 36.0–46.0)
Hemoglobin: 15.1 g/dL — ABNORMAL HIGH (ref 12.0–15.0)
MCH: 32.1 pg (ref 26.0–34.0)
MCHC: 33.6 g/dL (ref 30.0–36.0)
MCV: 95.3 fL (ref 80.0–100.0)
Platelets: 189 10*3/uL (ref 150–400)
RBC: 4.71 MIL/uL (ref 3.87–5.11)
RDW: 13.3 % (ref 11.5–15.5)
WBC: 7.1 10*3/uL (ref 4.0–10.5)
nRBC: 0 % (ref 0.0–0.2)

## 2020-03-07 LAB — SARS CORONAVIRUS 2 BY RT PCR (HOSPITAL ORDER, PERFORMED IN ~~LOC~~ HOSPITAL LAB): SARS Coronavirus 2: NEGATIVE

## 2020-03-07 SURGERY — APPENDECTOMY, LAPAROSCOPIC
Anesthesia: General

## 2020-03-07 MED ORDER — DORZOLAMIDE HCL-TIMOLOL MAL 2-0.5 % OP SOLN
1.0000 [drp] | Freq: Two times a day (BID) | OPHTHALMIC | Status: DC
  Administered 2020-03-08 – 2020-03-09 (×2): 1 [drp] via OPHTHALMIC
  Filled 2020-03-07: qty 10

## 2020-03-07 MED ORDER — LACTATED RINGERS IV SOLN: INTRAVENOUS | Status: DC | PRN

## 2020-03-07 MED ORDER — PIPERACILLIN-TAZOBACTAM 3.375 G IVPB
INTRAVENOUS | Status: AC
  Administered 2020-03-07: 3.375 g via INTRAVENOUS
  Filled 2020-03-07: qty 50

## 2020-03-07 MED ORDER — FENTANYL CITRATE (PF) 100 MCG/2ML IJ SOLN: 25.0000 ug | INTRAMUSCULAR | Status: DC | PRN

## 2020-03-07 MED ORDER — SUGAMMADEX SODIUM 500 MG/5ML IV SOLN
INTRAVENOUS | Status: DC | PRN
  Administered 2020-03-07: 120 mg via INTRAVENOUS

## 2020-03-07 MED ORDER — LIDOCAINE HCL (CARDIAC) PF 100 MG/5ML IV SOSY
PREFILLED_SYRINGE | INTRAVENOUS | Status: DC | PRN
  Administered 2020-03-07: 30 mg via INTRAVENOUS

## 2020-03-07 MED ORDER — SODIUM CHLORIDE 0.9 % IV SOLN: Freq: Once | INTRAVENOUS | Status: DC

## 2020-03-07 MED ORDER — ENOXAPARIN SODIUM 40 MG/0.4ML ~~LOC~~ SOLN
40.0000 mg | SUBCUTANEOUS | Status: DC
  Administered 2020-03-08 – 2020-03-09 (×2): 40 mg via SUBCUTANEOUS
  Filled 2020-03-07 (×2): qty 0.4

## 2020-03-07 MED ORDER — FENTANYL CITRATE (PF) 100 MCG/2ML IJ SOLN
INTRAMUSCULAR | Status: DC | PRN
Start: 2020-03-07 — End: 2020-03-07
  Administered 2020-03-07 (×2): 12.5 ug via INTRAVENOUS
  Administered 2020-03-07: 50 ug via INTRAVENOUS

## 2020-03-07 MED ORDER — ROCURONIUM BROMIDE 100 MG/10ML IV SOLN
INTRAVENOUS | Status: DC | PRN
  Administered 2020-03-07 (×2): 15 mg via INTRAVENOUS

## 2020-03-07 MED ORDER — LABETALOL HCL 5 MG/ML IV SOLN
INTRAVENOUS | Status: DC | PRN
  Administered 2020-03-07: 5 mg via INTRAVENOUS

## 2020-03-07 MED ORDER — ACETAMINOPHEN 325 MG PO TABS
650.0000 mg | ORAL_TABLET | Freq: Once | ORAL | Status: AC
  Administered 2020-03-07: 650 mg via ORAL
  Filled 2020-03-07: qty 2

## 2020-03-07 MED ORDER — PROPOFOL 10 MG/ML IV BOLUS
INTRAVENOUS | Status: DC | PRN
  Administered 2020-03-07: 50 mg via INTRAVENOUS
  Administered 2020-03-07: 20 mg via INTRAVENOUS

## 2020-03-07 MED ORDER — MORPHINE SULFATE (PF) 2 MG/ML IV SOLN
2.0000 mg | INTRAVENOUS | Status: DC | PRN
  Administered 2020-03-07 – 2020-03-09 (×2): 2 mg via INTRAVENOUS
  Filled 2020-03-07 (×2): qty 1

## 2020-03-07 MED ORDER — OXYCODONE HCL 5 MG/5ML PO SOLN: 5.0000 mg | Freq: Once | ORAL | Status: DC | PRN

## 2020-03-07 MED ORDER — ONDANSETRON 4 MG PO TBDP: 4.0000 mg | ORAL_TABLET | Freq: Four times a day (QID) | ORAL | Status: DC | PRN

## 2020-03-07 MED ORDER — ACETAMINOPHEN 10 MG/ML IV SOLN
INTRAVENOUS | Status: DC | PRN
  Administered 2020-03-07: 650 mg via INTRAVENOUS

## 2020-03-07 MED ORDER — BUPIVACAINE-EPINEPHRINE (PF) 0.5% -1:200000 IJ SOLN
INTRAMUSCULAR | Status: AC
  Filled 2020-03-07: qty 30

## 2020-03-07 MED ORDER — POLYETHYLENE GLYCOL 3350 17 G PO PACK: 17.0000 g | PACK | Freq: Every day | ORAL | Status: DC | PRN

## 2020-03-07 MED ORDER — LABETALOL HCL 5 MG/ML IV SOLN
INTRAVENOUS | Status: AC
  Filled 2020-03-07: qty 4

## 2020-03-07 MED ORDER — LATANOPROST 0.005 % OP SOLN
1.0000 [drp] | Freq: Every day | OPHTHALMIC | Status: DC
  Administered 2020-03-08: 1 [drp] via OPHTHALMIC
  Filled 2020-03-07: qty 2.5

## 2020-03-07 MED ORDER — PANTOPRAZOLE SODIUM 40 MG IV SOLR
40.0000 mg | Freq: Every day | INTRAVENOUS | Status: DC
  Administered 2020-03-08: 40 mg via INTRAVENOUS
  Filled 2020-03-07: qty 40

## 2020-03-07 MED ORDER — PIPERACILLIN-TAZOBACTAM 3.375 G IVPB
3.3750 g | Freq: Three times a day (TID) | INTRAVENOUS | Status: DC
  Administered 2020-03-08 – 2020-03-09 (×4): 3.375 g via INTRAVENOUS
  Filled 2020-03-07 (×4): qty 50

## 2020-03-07 MED ORDER — CARBIDOPA-LEVODOPA ER 25-100 MG PO TBCR
1.0000 | EXTENDED_RELEASE_TABLET | Freq: Two times a day (BID) | ORAL | Status: DC
  Filled 2020-03-07: qty 1

## 2020-03-07 MED ORDER — ONDANSETRON HCL 4 MG/2ML IJ SOLN
INTRAMUSCULAR | Status: AC
  Filled 2020-03-07: qty 2

## 2020-03-07 MED ORDER — OXYCODONE HCL 5 MG PO TABS: 5.0000 mg | ORAL_TABLET | Freq: Once | ORAL | Status: DC | PRN

## 2020-03-07 MED ORDER — DEXAMETHASONE SODIUM PHOSPHATE 10 MG/ML IJ SOLN
INTRAMUSCULAR | Status: AC
  Filled 2020-03-07: qty 1

## 2020-03-07 MED ORDER — FENTANYL CITRATE (PF) 100 MCG/2ML IJ SOLN
INTRAMUSCULAR | Status: AC
  Filled 2020-03-07: qty 2

## 2020-03-07 MED ORDER — BUPIVACAINE-EPINEPHRINE (PF) 0.5% -1:200000 IJ SOLN
INTRAMUSCULAR | Status: DC | PRN
  Administered 2020-03-07: 30 mL via PERINEURAL

## 2020-03-07 MED ORDER — LACTATED RINGERS IV SOLN: INTRAVENOUS | Status: DC

## 2020-03-07 MED ORDER — CARBIDOPA-LEVODOPA ER 25-100 MG PO TBCR: 1.0000 | EXTENDED_RELEASE_TABLET | Freq: Two times a day (BID) | ORAL | Status: DC

## 2020-03-07 MED ORDER — ONDANSETRON HCL 4 MG/2ML IJ SOLN: 4.0000 mg | Freq: Once | INTRAMUSCULAR | Status: DC | PRN

## 2020-03-07 MED ORDER — ACETAMINOPHEN 10 MG/ML IV SOLN
INTRAVENOUS | Status: AC
  Filled 2020-03-07: qty 100

## 2020-03-07 MED ORDER — ONDANSETRON HCL 4 MG/2ML IJ SOLN
4.0000 mg | Freq: Four times a day (QID) | INTRAMUSCULAR | Status: DC | PRN
  Administered 2020-03-07: 4 mg via INTRAVENOUS
  Filled 2020-03-07: qty 2

## 2020-03-07 MED ORDER — ONDANSETRON HCL 4 MG/2ML IJ SOLN
INTRAMUSCULAR | Status: DC | PRN
  Administered 2020-03-07: 4 mg via INTRAVENOUS

## 2020-03-07 MED ORDER — PIPERACILLIN-TAZOBACTAM 3.375 G IVPB 30 MIN
3.3750 g | Freq: Once | INTRAVENOUS | Status: AC
  Administered 2020-03-07: 3.375 g via INTRAVENOUS
  Filled 2020-03-07: qty 50

## 2020-03-07 MED ORDER — ACETAMINOPHEN 500 MG PO TABS
1000.0000 mg | ORAL_TABLET | Freq: Four times a day (QID) | ORAL | Status: DC
  Administered 2020-03-09: 1000 mg via ORAL
  Filled 2020-03-07 (×7): qty 2

## 2020-03-07 MED ORDER — SUCCINYLCHOLINE CHLORIDE 20 MG/ML IJ SOLN
INTRAMUSCULAR | Status: DC | PRN
  Administered 2020-03-07: 60 mg via INTRAVENOUS

## 2020-03-07 MED ORDER — DORZOLAMIDE HCL-TIMOLOL MAL 2-0.5 % OP SOLN: 1.0000 [drp] | Freq: Two times a day (BID) | OPHTHALMIC | Status: DC

## 2020-03-07 MED ORDER — DEXAMETHASONE SODIUM PHOSPHATE 10 MG/ML IJ SOLN
INTRAMUSCULAR | Status: DC | PRN
  Administered 2020-03-07: 5 mg via INTRAVENOUS

## 2020-03-07 SURGICAL SUPPLY — 50 items
ADH SKN CLS APL DERMABOND .7 (GAUZE/BANDAGES/DRESSINGS) ×1
APL PRP STRL LF DISP 70% ISPRP (MISCELLANEOUS) ×1
BAG SPEC RTRVL LRG 6X4 10 (ENDOMECHANICALS) ×1
BULB RESERV EVAC DRAIN JP 100C (MISCELLANEOUS) ×3 IMPLANT
CANISTER SUCT 1200ML W/VALVE (MISCELLANEOUS) ×3 IMPLANT
CHLORAPREP W/TINT 26 (MISCELLANEOUS) ×3 IMPLANT
COVER WAND RF STERILE (DRAPES) ×3 IMPLANT
CUTTER FLEX LINEAR 45M (STAPLE) IMPLANT
DERMABOND ADVANCED (GAUZE/BANDAGES/DRESSINGS) ×2
DERMABOND ADVANCED .7 DNX12 (GAUZE/BANDAGES/DRESSINGS) ×1 IMPLANT
DRAIN CHANNEL JP 19F (MISCELLANEOUS) ×3 IMPLANT
DRSG TEGADERM 4X4.75 (GAUZE/BANDAGES/DRESSINGS) ×6 IMPLANT
ELECT CAUTERY BLADE 6.4 (BLADE) IMPLANT
ELECT REM PT RETURN 9FT ADLT (ELECTROSURGICAL) ×3
ELECTRODE REM PT RTRN 9FT ADLT (ELECTROSURGICAL) ×1 IMPLANT
GAUZE SPONGE 4X4 12PLY STRL (GAUZE/BANDAGES/DRESSINGS) ×6 IMPLANT
GLOVE SURG SYN 7.0 (GLOVE) ×6 IMPLANT
GLOVE SURG SYN 7.5  E (GLOVE) ×4
GLOVE SURG SYN 7.5 E (GLOVE) ×2 IMPLANT
GOWN STRL REUS W/ TWL LRG LVL3 (GOWN DISPOSABLE) ×2 IMPLANT
GOWN STRL REUS W/TWL LRG LVL3 (GOWN DISPOSABLE) ×6
IRRIGATION STRYKERFLOW (MISCELLANEOUS) ×1 IMPLANT
IRRIGATOR STRYKERFLOW (MISCELLANEOUS) ×3
IV NS 1000ML (IV SOLUTION) ×3
IV NS 1000ML BAXH (IV SOLUTION) ×1 IMPLANT
KIT TURNOVER KIT A (KITS) ×3 IMPLANT
LABEL OR SOLS (LABEL) ×3 IMPLANT
LIGASURE LAP MARYLAND 5MM 37CM (ELECTROSURGICAL) ×3 IMPLANT
NEEDLE HYPO 22GX1.5 SAFETY (NEEDLE) ×3 IMPLANT
NS IRRIG 500ML POUR BTL (IV SOLUTION) ×3 IMPLANT
PACK LAP CHOLECYSTECTOMY (MISCELLANEOUS) ×3 IMPLANT
PENCIL ELECTRO HAND CTR (MISCELLANEOUS) ×3 IMPLANT
POUCH SPECIMEN RETRIEVAL 10MM (ENDOMECHANICALS) ×3 IMPLANT
RELOAD 45 VASCULAR/THIN (ENDOMECHANICALS) IMPLANT
RELOAD STAPLE TA45 3.5 REG BLU (ENDOMECHANICALS) ×3 IMPLANT
SCISSORS METZENBAUM CVD 33 (INSTRUMENTS) IMPLANT
SLEEVE ADV FIXATION 5X100MM (TROCAR) ×3 IMPLANT
SUT ETHILON 2 0 FS 18 (SUTURE) ×3 IMPLANT
SUT MNCRL 4-0 (SUTURE) ×3
SUT MNCRL 4-0 27XMFL (SUTURE) ×1
SUT VIC AB 3-0 SH 27 (SUTURE) ×3
SUT VIC AB 3-0 SH 27X BRD (SUTURE) ×1 IMPLANT
SUT VICRYL 0 AB UR-6 (SUTURE) ×6 IMPLANT
SUTURE MNCRL 4-0 27XMF (SUTURE) ×1 IMPLANT
SYS KII FIOS ACCESS ABD 5X100 (TROCAR) ×3
SYSTEM KII FIOS ACES ABD 5X100 (TROCAR) ×1 IMPLANT
TRAY FOLEY MTR SLVR 16FR STAT (SET/KITS/TRAYS/PACK) ×3 IMPLANT
TROCAR BALLN GELPORT 12X130M (ENDOMECHANICALS) ×3 IMPLANT
TUBING EVAC SMOKE HEATED PNEUM (TUBING) ×3 IMPLANT
scd medium sleeve ×3 IMPLANT

## 2020-03-07 NOTE — Anesthesia Procedure Notes (Signed)
Procedure Name: Intubation Date/Time: 03/07/2020 8:17 PM Performed by: Waldo Laine, CRNA Pre-anesthesia Checklist: Patient identified, Patient being monitored, Timeout performed, Emergency Drugs available and Suction available Patient Re-evaluated:Patient Re-evaluated prior to induction Oxygen Delivery Method: Circle system utilized Preoxygenation: Pre-oxygenation with 100% oxygen Induction Type: IV induction and Rapid sequence Laryngoscope Size: 3 and McGraph Grade View: Grade I Tube type: Oral Tube size: 6.5 mm Number of attempts: 1 Airway Equipment and Method: Stylet Placement Confirmation: ETT inserted through vocal cords under direct vision,  positive ETCO2 and breath sounds checked- equal and bilateral Secured at: 21 cm Tube secured with: Tape Dental Injury: Teeth and Oropharynx as per pre-operative assessment  Future Recommendations: Recommend- induction with short-acting agent, and alternative techniques readily available Comments: Unable to lie flat due to kyphosis. Oral opening somewhat small.

## 2020-03-07 NOTE — ED Notes (Signed)
ZS PA talking with pt now at bedside.

## 2020-03-07 NOTE — ED Notes (Signed)
Messaged provider ZS for confirmation to determine if still want tylenol and carbidopa-levodopa PO to be given since pt NPO for surgery. Also clarifying if zosyn to be given now or sent with pt when on way to surgery. Will give or chart as held in [facility] once this RN receives a reply.

## 2020-03-07 NOTE — ED Notes (Signed)
EDP Roxan Hockey at bedside. Pt alert and calm. Laying in bed.

## 2020-03-07 NOTE — ED Triage Notes (Signed)
Patient brought in by ems. Patient with complaint of lower abdominal pain times four days. Patient states that she has been seen at the hospital and by her pcp times 2 and they did not find anything.

## 2020-03-07 NOTE — ED Notes (Signed)
Pt presents to ED via EMS from [facility]. Pt complaining of abdominal pain for three days. Absent bowel sounds per nurse at facility. Pt states she has had bowel movements but denies vomiting. Sharp upper abdominal pain.

## 2020-03-07 NOTE — H&P (Signed)
Date of Admission:  03/07/2020  Reason for Admission:   Acute appendicitis  History of Present Illness: Cheryl Cummings is a 79 y.o. female presenting with a 3-4 day history of abdominal pain.  Patient reports the pain is in the right lower quadrant.  Denies any fevers or chills.  Denies any nausea or vomiting.  The pain radiates towards her pelvis.  Denies any dysuria.  In the ED, her laboratory workup was overall unremarkable, with normal WBC and Cr.  CT scan however, showed acute appendicitis, with inflamed appendix with appendicolith, surrounding fat stranding, and free fluid with layering in the pelvis.  CT also showed fecal impaction, but she had a BM in the ED this afternoon.  She typically has a BM every 3 days or so.  Her son, Cheryl Cummings, is DPOA.  The patient is confused, and per ED physician, had mentioned to him that he was partying.  With me, she has mentioned that she's unsure about surgery until she talks with her children, and repeats questions frequently about surgery or antibiotics or diet.  Past Medical History: Past Medical History:  Diagnosis Date  . Essential hypertension, benign   . GERD (gastroesophageal reflux disease)   . Glaucoma   . Parkinson disease [facility])      Past Surgical History: Past Surgical History:  Procedure Laterality Date  . VAGINAL DELIVERY     twice    Home Medications: Prior to Admission medications   Medication Sig Start Date End Date Taking? Authorizing Provider  carbidopa-levodopa (SINEMET IR) 25-100 MG tablet Take 1-2 tablets by mouth in the morning, at noon, in the evening, and at bedtime. 02/19/20  Yes [provider]  Carbidopa-Levodopa ER (SINEMET CR) 25-100 MG tablet controlled release Take 1 tablet by mouth 2 (two) times daily.   Yes [provider]  dorzolamide-timolol (COSOPT) 22.3-6.8 MG/ML ophthalmic solution Place 1 drop into both eyes 2 (two) times daily.   Yes [provider]  Multiple Vitamins-Minerals  (MULTIVITAMIN WITH MINERALS) tablet Take 1 tablet by mouth daily.   Yes [provider]  TRAVATAN Z 0.004 % SOLN ophthalmic solution Place 1 drop into both eyes at bedtime.   Yes [provider]  Vitamin D, Ergocalciferol, (DRISDOL) 50000 units CAPS capsule Take 1 capsule (50,000 Units total) by mouth every 30 (thirty) days. Patient taking differently: Take 50,000 Units by mouth. Every 28 days 03/14/17  Yes [name], MD  acetaminophen (TYLENOL) 325 MG tablet Take 325-650 mg by mouth every 4 (four) hours as needed for mild pain or fever.    [provider]  alum & mag hydroxide-simeth (MINTOX) 200-200-20 MG/5ML suspension Take 30 mLs by mouth every 6 (six) hours as needed for indigestion or heartburn.    [provider]  bismuth subsalicylate (KAOPECTATE) 262 MG/15ML suspension Take 10 mLs by mouth every 6 (six) hours as needed for indigestion or diarrhea or loose stools.    [provider]  carbamide peroxide (DEBROX) 6.5 % OTIC solution Place 5 drops into both ears 2 (two) times daily as needed (wax buildup).    [provider]  cetirizine (ZYRTEC) 10 MG tablet Take 10 mg by mouth daily as needed for allergies.    [provider]  dextromethorphan-guaiFENesin (TUSSIN DM) 10-100 MG/5ML liquid Take 10 mLs by mouth every 4 (four) hours as needed for cough.    [provider]  diphenhydrAMINE (BENADRYL) 25 MG tablet Take 25 mg by mouth every 6 (six) hours as needed  for itching or allergies.    [provider]  magnesium hydroxide (MILK OF MAGNESIA) 400 MG/5ML suspension Take 30 mLs by mouth at bedtime as needed for mild constipation or moderate constipation.    [provider]  nitroGLYCERIN (NITROSTAT) 0.4 MG SL tablet Place 0.4 mg under the tongue as needed. 01/30/20   [provider]  ondansetron (ZOFRAN) 4 MG tablet Take 4 mg by mouth every 8 (eight) hours as needed for nausea or vomiting.     [provider]    Allergies: Allergies  Allergen Reactions  . Nsaids Anaphylaxis  . Diazepam Other (See Comments)    Unknown  Other reaction(s): Unknown Unknown Other Reaction: OTHER REACTION  . Ibuprofen Swelling  . Meperidine Other (See Comments)    Other Reaction: OTHER REACTION  . Mushroom Extract Complex Nausea And Vomiting  . Other Diarrhea    Sour Cream  . Shellfish Allergy Rash    Social History:  reports that she has never smoked. She has never used smokeless tobacco. She reports that she does not drink alcohol and does not use drugs.   Family History: No family history on file.  Review of Systems: Review of Systems  Constitutional: Negative for chills and fever.  HENT: Negative for hearing loss.   Respiratory: Negative for shortness of breath.   Cardiovascular: Negative for chest pain.  Gastrointestinal: Positive for abdominal pain and constipation. Negative for blood in stool, diarrhea, nausea and vomiting.  Genitourinary: Negative for dysuria.  Musculoskeletal: Negative for myalgias.  Skin: Negative for rash.  Neurological: Negative for dizziness.  Psychiatric/Behavioral: Negative for depression.    Physical Exam BP (!) 175/93   Pulse 89   Temp 98.1 F (36.7 C) (Oral)   Resp 16   Ht 5\' 6"  (1.676 m)   SpO2 100%   BMI 19.37 kg/m  CONSTITUTIONAL: No acute distress HEENT:  Normocephalic, atraumatic, extraocular motion intact. NECK: Trachea is midline, and there is no jugular venous distension.  RESPIRATORY:  Lungs are clear, and breath sounds are equal bilaterally. Normal respiratory effort without pathologic use of accessory muscles. CARDIOVASCULAR: Heart is regular without murmurs, gallops, or rubs. GI: The abdomen is soft, non-distended, with tenderness to palpation mostly in the right lower quadrant, with some extending towards the pelvis. MUSCULOSKELETAL:  Normal muscle strength and tone in all four extremities.  No peripheral edema or  cyanosis. SKIN: Skin turgor is normal. There are no pathologic skin lesions.  NEUROLOGIC:  Motor and sensation is grossly normal.  Cranial nerves are grossly intact.   Laboratory Analysis: Results for orders placed or performed during the hospital encounter of 03/07/20 (from the past 24 hour(s))  Lipase, blood     Status: None   Collection Time: 03/07/20  2:37 AM  Result Value Ref Range   Lipase 29 11 - 51 U/L  Comprehensive metabolic panel     Status: Abnormal   Collection Time: 03/07/20  2:37 AM  Result Value Ref Range   Sodium 141 135 - 145 mmol/L   Potassium 3.7 3.5 - 5.1 mmol/L   Chloride 104 98 - 111 mmol/L   CO2 27 22 - 32 mmol/L   Glucose, Bld 115 (H) 70 - 99 mg/dL   BUN 29 (H) 8 - 23 mg/dL   Creatinine, Ser 4.090.65 0.44 - 1.00 mg/dL   Calcium 9.3 8.9 - 81.110.3 mg/dL   Total Protein 7.3 6.5 - 8.1 g/dL   Albumin 4.3 3.5 - 5.0 g/dL   AST 20 15 -  41 U/L   ALT 12 0 - 44 U/L   Alkaline Phosphatase 59 38 - 126 U/L   Total Bilirubin 1.0 0.3 - 1.2 mg/dL   GFR calc non Af Amer >60 >60 mL/min   GFR calc Af Amer >60 >60 mL/min   Anion gap 10 5 - 15  CBC     Status: Abnormal   Collection Time: 03/07/20  2:37 AM  Result Value Ref Range   WBC 7.1 4.0 - 10.5 K/uL   RBC 4.71 3.87 - 5.11 MIL/uL   Hemoglobin 15.1 (H) 12.0 - 15.0 g/dL   HCT 73.4 36 - 46 %   MCV 95.3 80.0 - 100.0 fL   MCH 32.1 26.0 - 34.0 pg   MCHC 33.6 30.0 - 36.0 g/dL   RDW 19.3 79.0 - 24.0 %   Platelets 189 150 - 400 K/uL   nRBC 0.0 0.0 - 0.2 %    Imaging: CT ABDOMEN PELVIS WO CONTRAST  Addendum Date: 03/07/2020   ADDENDUM REPORT: 03/07/2020 13:25 ADDENDUM: 2 pulmonary nodules in the right lower lobe measuring 16 mm and 13 mm respectively. These are unchanged compared with the CT of the chest dated 07/24/2018 and were evaluated on a CT scan dated 08/08/2018 and were suggestive of a benign etiology. Electronically Signed   By: Elige Ko   On: 03/07/2020 13:25   Result Date: 03/07/2020 CLINICAL DATA:  Absent  bowel sounds, no vomiting, sharp upper abdominal pain EXAM: CT ABDOMEN AND PELVIS WITHOUT CONTRAST TECHNIQUE: Multidetector CT imaging of the abdomen and pelvis was performed following the standard protocol without IV contrast. COMPARISON:  07/24/2018 FINDINGS: Lower chest: 2 pulmonary nodules in the right lower lobe measuring 16 mm and 13 mm respectively. Hepatobiliary: No focal liver abnormality is seen. No gallstones, gallbladder wall thickening, or biliary dilatation. Pancreas: Unremarkable. No pancreatic ductal dilatation or surrounding inflammatory changes. Spleen: Normal in size without focal abnormality. Adrenals/Urinary Tract: Adrenal glands are unremarkable. Kidneys are normal, without renal calculi, focal lesion, or hydronephrosis. Bladder is unremarkable. Stomach/Bowel: No bowel dilatation. No pneumatosis, pneumoperitoneum or portal venous gas. Dilated appendix measuring 13 mm in diameter with haziness in the surrounding fat and a 5 mm appendicolith. Rectal fecal impaction. Vascular/Lymphatic: Normal caliber abdominal aorta with mild atherosclerosis. No lymphadenopathy. Reproductive: Status post hysterectomy. No adnexal masses. Other: Moderate amount of complex pelvic free fluid with a hyperdense level dependently suggesting blood products or proteinaceous fluid. Musculoskeletal: No acute osseous abnormality. No aggressive osseous lesion. Severe levoscoliosis of the thoracolumbar spine. IMPRESSION: 1. Acute appendicitis. Moderate amount of complex pelvic free fluid with a hyperdense level dependently suggesting blood products or proteinaceous fluid. 2. Rectal fecal impaction. 3. Aortic Atherosclerosis (ICD10-I70.0). Electronically Signed: By: Elige Ko On: 03/07/2020 12:27    Assessment and Plan: This is a 79 y.o. female with acute appendicitis.  CT scan images viewed by me independently and have reviewed them with Dr. Allena Katz as well with radiology.  Patient has surrounding stranding and  appendicolith, but appears the cecum and towards the base of appendix is less inflamed.  In the pelvis, there is fluid which as not organized into an abscess yet.  I suspect that the patient had a degree of ruptured appendicitis.  Her WBC surprisingly is completely normal, but she is definitely tender in the RLQ.    Discussed with the patient that the recommendation would be to admit to the surgery team and go to the OR for laparoscopic appendectomy.  However, the patient reports that all  is too soon and she needs to think about it and talk with her children, but she would rather not have surgery today.  Dr. Roxan Hockey mentioned to me about her thinking he was out partying, so there's is definitely some degree of confusion vs underlying dementia.  Cheryl Cummings has discussed with the patient's son, Cheryl Cummings, who is DPOA and he has consented for surgery.  I will talk to the patient myself as well again to make sure there are no other questions or concerns.  Otherwise, will plan on going to OR tonight for laparoscopic appendectomy.   Howie Ill, MD [facility] Pg:  [phone]

## 2020-03-07 NOTE — ED Notes (Signed)
Pt assisted up to toilet at bedside as she stated she needed to has a BM. Had large soft/formed BM. Pt had urinated in her shorts so switched to briefs. Shorts placed in belongings bag. Linens and bed pad changed. Pt assisted back into bed and repositioned. Bed locked low. Rails up. Call bell within reach. Will send urine sample next time able to collect.

## 2020-03-07 NOTE — ED Notes (Signed)
This RN educated pt as she refused zosyn and pain meds this RN brought to bedside. Pt keeps stating "I'm not having surgery". Pt now refusing to answer A&O questions. Pt wants this RN to allow her out of bed. Explained pt is a high fall risk since she needs so much assistance to get out or bed and is not steady when standing. Educated pt why she cannot get up and walk around. Pt keeps stating this RN must allow her out of bed "in her own home".

## 2020-03-07 NOTE — ED Notes (Signed)
Lights dimmed at pt's request. Educated again about monitor and call bell. EDP Roxan Hockey given pt's daughter's name and number to update when possible; pt verbal okay with this.

## 2020-03-07 NOTE — ED Notes (Signed)
Pt leaving for CT.  

## 2020-03-07 NOTE — Op Note (Signed)
  Procedure Date:  03/07/2020  Pre-operative Diagnosis:  Acute appendicitis  Post-operative Diagnosis:  Acute ruptured appendicitis  Procedure:  Laparoscopic appendectomy  Surgeon:  Howie Ill, MD  Anesthesia:  General endotracheal  Estimated Blood Loss:  5 ml  Specimens:  appendix  Complications:  None  Findings:  Seropurulent fluid in the pelvis and RUQ.  Appendix had a small perforation consistent with rupture, with purulent fluid leaking from perforation site.  Indications for Procedure:  This is a 79 y.o. female who presents with abdominal pain and workup revealing acute appendicitis.  The options of surgery versus observation were reviewed with the patient and/or family. The risks of bleeding, infection, recurrence of symptoms, negative laparoscopy, potential for an open procedure, bowel injury, abscess or infection, were all discussed with the patient and she and her son were willing to proceed.  Description of Procedure: The patient was correctly identified in the preoperative area and brought into the operating room.  The patient was placed supine with VTE prophylaxis in place.  Appropriate time-outs were performed.  Anesthesia was induced and the patient was intubated.  Foley catheter was placed.  Appropriate antibiotics were infused.  The abdomen was prepped and draped in a sterile fashion. An infraumbilical incision was made. A cutdown technique was used to enter the abdominal cavity without injury, and a Hasson trocar was inserted.  Pneumoperitoneum was obtained with appropriate opening pressures.  Two 5-mm ports were placed in the suprapubic and left lateral positions under direct visualization.  The right lower quadrant was inspected and the appendix was identified and found to be acutely inflamed and perforated.  There was seropurulent fluid in the pelvis and RUQ.  This was suctioned off.  The appendix was carefully dissected.  The mesoappendix was divided using the  LigaSure.  The base of the appendix was dissected out and divided with a standard load Endo GIA.  The appendix was placed in an Endocatch bag.  The right lower quadrant was then inspected again revealing an intact staple line, no bleeding, and no bowel injury.  The pelvis, right lower quadrant and right upper quadrant were thoroughly irrigated.  A 19 Fr. Blake drain was inserted via the left lateral port, going to pelvis and RLQ.  The 5 mm ports were removed under direct visualization and the Hasson trocar was removed.  The Endocatch bag was brought out through the umbilical incision.  The fascial opening was closed using 0 vicryl suture.  Local anesthetic was infused in all incisions and the incisions were closed with 3-0 Vicryl and 4-0 Monocryl.  The wounds were cleaned and sealed with DermaBond.  The drain was secured using 2-0 Nylon and dressed with 4x4 gauze and TegaDerm.  Foley catheter was removed and the patient was emerged from anesthesia and extubated and brought to the recovery room for further management.  The patient tolerated the procedure well and all counts were correct at the end of the case.   Howie Ill, MD

## 2020-03-07 NOTE — Progress Notes (Signed)
Brief Progress Note I discussed patient via telephone with her son, Samoria Fedorko, who has DPOA. I discussed recommendations for laparoscopic appendectomy and the risks, benefits, and alternatives to the procedure with him in detail. All questions answered. He is agreeable and consents to proceed. He will call and speak with the patient as well and will confirm consent with her RN.   Orders updated.   -- Lynden Oxford, PA-C [facility] 03/07/2020, 2:53 PM [phone] M-F: 7am - 4pm

## 2020-03-07 NOTE — ED Notes (Signed)
Spoke with Pt POA [name], listed in chart). Updated on OR plan. Pt son and pt both are both comfortable to proceed with OR as planned. Pt son gives verbal consent for procedure, states all his questions were answered by MD

## 2020-03-07 NOTE — ED Notes (Signed)
Pt talking with son [name] on phone.

## 2020-03-07 NOTE — ED Provider Notes (Signed)
[facility] Emergency Department Provider Note    First MD Initiated Contact with Patient 03/07/20 1117     (approximate)  I have reviewed the triage vital signs and the nursing notes.   HISTORY  Chief Complaint Abdominal Pain    HPI Cheryl Cummings is a 79 y.o. female [place] past medical history presents to the ER for 4 days of generalized abdominal pain.  States it is intermittent.  States is in the right lower area primarily.  Denies any dysuria.  No flank pain.  Is never had pain like this before.    Past Medical History:  Diagnosis Date  . Essential hypertension, benign   . GERD (gastroesophageal reflux disease)   . Glaucoma   . Parkinson disease (HCC)    No family history on file. Past Surgical History:  Procedure Laterality Date  . VAGINAL DELIVERY     twice   Patient Active Problem List   Diagnosis Date Noted  . Dementia (HCC) 12/18/2018  . Parkinson disease (HCC) 03/14/2017  . Malnutrition of mild degree (HCC) 03/14/2017  . GERD (gastroesophageal reflux disease)   . Glaucoma   . Generalized osteoarthritis of multiple sites       Prior to Admission medications   Medication Sig Start Date End Date Taking? Authorizing Provider  acetaminophen (TYLENOL) 325 MG tablet Take 325-650 mg by mouth every 4 (four) hours as needed for mild pain or fever.    [provider]  alum & mag hydroxide-simeth (MINTOX) 200-200-20 MG/5ML suspension Take 30 mLs by mouth every 6 (six) hours as needed for indigestion or heartburn.    [provider]  bismuth subsalicylate (KAOPECTATE) 262 MG/15ML suspension Take 10 mLs by mouth every 6 (six) hours as needed for indigestion or diarrhea or loose stools.    [provider]  carbamide peroxide (DEBROX) 6.5 % OTIC solution Place 5 drops into both ears 2 (two) times daily as needed (wax buildup).    [provider]  Carbidopa-Levodopa ER (SINEMET CR) 25-100 MG tablet  controlled release Take 1 tablet by mouth 2 (two) times daily.    [provider]  cetirizine (ZYRTEC) 10 MG tablet Take 10 mg by mouth daily as needed for allergies.    [provider]  dextromethorphan-guaiFENesin (TUSSIN DM) 10-100 MG/5ML liquid Take 10 mLs by mouth every 4 (four) hours as needed for cough.    [provider]  diphenhydrAMINE (BENADRYL) 25 MG tablet Take 25 mg by mouth every 6 (six) hours as needed for itching or allergies.    [provider]  dorzolamide-timolol (COSOPT) 22.3-6.8 MG/ML ophthalmic solution Place 1 drop into both eyes 2 (two) times daily.    [provider]  famotidine (PEPCID) 20 MG tablet Take 1 tablet (20 mg total) by mouth 2 (two) times daily. Patient taking differently: Take 20 mg by mouth daily as needed for heartburn or indigestion.  09/06/16   Sharman Cheek, MD  magnesium hydroxide (MILK OF MAGNESIA) 400 MG/5ML suspension Take 30 mLs by mouth at bedtime as needed for mild constipation or moderate constipation.    [provider]  Multiple Vitamins-Minerals (MULTIVITAMIN WITH MINERALS) tablet Take 1 tablet by mouth daily.    [provider]  ondansetron (ZOFRAN) 4 MG tablet Take 4 mg by mouth every 8 (eight) hours as needed for nausea or vomiting.    [provider]  TRAVATAN Z 0.004 % SOLN ophthalmic solution Place 1 drop into both eyes at bedtime.  [provider]  Vitamin D, Ergocalciferol, (DRISDOL) 50000 units CAPS capsule Take 1 capsule (50,000 Units total) by mouth every 30 (thirty) days. Patient taking differently: Take 50,000 Units by mouth. Every 28 days 03/14/17   [name], MD    Allergies Nsaids, Diazepam, Ibuprofen, Meperidine, Mushroom extract complex, Other, and Shellfish allergy    Social History Social History   Tobacco Use  . Smoking status: Never Smoker  . Smokeless tobacco: Never Used  Substance Use Topics  . Alcohol use: No  . Drug  use: No    Review of Systems Patient denies headaches, rhinorrhea, blurry vision, numbness, shortness of breath, chest pain, edema, cough, abdominal pain, nausea, vomiting, diarrhea, dysuria, fevers, rashes or hallucinations unless otherwise stated above in HPI. ____________________________________________   PHYSICAL EXAM:  VITAL SIGNS: Vitals:   03/07/20 1200 03/07/20 1230  BP: 136/90   Pulse: 91 (!) 103  Resp:    Temp:    SpO2: 98% 98%    Constitutional: Alert and oriented.  Eyes: Conjunctivae are normal.  Head: Atraumatic. Nose: No congestion/rhinnorhea. Mouth/Throat: Mucous membranes are moist.   Neck: No stridor. Painless ROM.  Cardiovascular: Normal rate, regular rhythm. Grossly normal heart sounds.  Good peripheral circulation. Respiratory: Normal respiratory effort.  No retractions. Lungs CTAB. Gastrointestinal: Soft with generalized ttp. No distention. No abdominal bruits. No CVA tenderness. Genitourinary: deferred Musculoskeletal: No lower extremity tenderness nor edema.  No joint effusions. Neurologic:  Normal speech and language. No gross focal neurologic deficits are appreciated. No facial droop Skin:  Skin is warm, dry and intact. No rash noted. Psychiatric: Mood and affect are normal. Speech and behavior are normal.  ____________________________________________   LABS (all labs ordered are listed, but only abnormal results are displayed)  Results for orders placed or performed during the hospital encounter of 03/07/20 (from the past 24 hour(s))  Lipase, blood     Status: None   Collection Time: 03/07/20  2:37 AM  Result Value Ref Range   Lipase 29 11 - 51 U/L  Comprehensive metabolic panel     Status: Abnormal   Collection Time: 03/07/20  2:37 AM  Result Value Ref Range   Sodium 141 135 - 145 mmol/L   Potassium 3.7 3.5 - 5.1 mmol/L   Chloride 104 98 - 111 mmol/L   CO2 27 22 - 32 mmol/L   Glucose, Bld 115 (H) 70 - 99 mg/dL   BUN 29 (H) 8 - 23 mg/dL     Creatinine, Ser 9.83 0.44 - 1.00 mg/dL   Calcium 9.3 8.9 - 38.2 mg/dL   Total Protein 7.3 6.5 - 8.1 g/dL   Albumin 4.3 3.5 - 5.0 g/dL   AST 20 15 - 41 U/L   ALT 12 0 - 44 U/L   Alkaline Phosphatase 59 38 - 126 U/L   Total Bilirubin 1.0 0.3 - 1.2 mg/dL   GFR calc non Af Amer >60 >60 mL/min   GFR calc Af Amer >60 >60 mL/min   Anion gap 10 5 - 15  CBC     Status: Abnormal   Collection Time: 03/07/20  2:37 AM  Result Value Ref Range   WBC 7.1 4.0 - 10.5 K/uL   RBC 4.71 3.87 - 5.11 MIL/uL   Hemoglobin 15.1 (H) 12.0 - 15.0 g/dL   HCT 50.5 36 - 46 %   MCV 95.3 80.0 - 100.0 fL   MCH 32.1 26.0 - 34.0 pg   MCHC 33.6 30.0 - 36.0 g/dL   RDW  13.3 11.5 - 15.5 %   Platelets 189 150 - 400 K/uL   nRBC 0.0 0.0 - 0.2 %   ____________________________________________ ____________________________________________  RADIOLOGY  I personally reviewed all radiographic images ordered to evaluate for the above acute complaints and reviewed radiology reports and findings.  These findings were personally discussed with the patient.  Please see medical record for radiology report.  ____________________________________________   PROCEDURES  Procedure(s) performed:  Procedures    Critical Care performed: no ____________________________________________   INITIAL IMPRESSION / ASSESSMENT AND PLAN / ED COURSE  Pertinent labs & imaging results that were available during my care of the patient were reviewed by me and considered in my medical decision making (see chart for details).   DDX: Appendicitis, diverticulitis, colitis, SBO, constipation, UTI, stone  Cheryl Cummings is a 79 y.o. who presents to the ED with symptoms as described above.  Patient with tenderness palpation or abdominal exam.  She is guarding.  No white count or fever.  Will order CT imaging  Clinical Course as of Mar 07 1325  Mon Mar 07, 2020  1306 CT abdomen shows evidence of acute appendicitis concerning for perforation.   No white count or signs of sepsis.  Discussed case in consultation with Dr. Aleen Campi of general surgery.  Will give fluids as well as IV antibiotics.   [PR]    Clinical Course User Index [PR] Willy Eddy, MD    The patient was evaluated in Emergency Department today for the symptoms described in the history of present illness. He/she was evaluated in the context of the global COVID-19 pandemic, which necessitated consideration that the patient might be at risk for infection with the SARS-CoV-2 virus that causes COVID-19. Institutional protocols and algorithms that pertain to the evaluation of patients at risk for COVID-19 are in a state of rapid change based on information released by regulatory bodies including the CDC and federal and state organizations. These policies and algorithms were followed during the patient's care in the ED.  As part of my medical decision making, I reviewed the following data within the electronic MEDICAL RECORD NUMBER Nursing notes reviewed and incorporated, Labs reviewed, notes from prior ED visits and [place] Controlled Substance Database   ____________________________________________   FINAL CLINICAL IMPRESSION(S) / ED DIAGNOSES  Final diagnoses:  Acute appendicitis, unspecified acute appendicitis type      NEW MEDICATIONS STARTED DURING THIS VISIT:  New Prescriptions   No medications on file     Note:  This document was prepared using Dragon voice recognition software and may include unintentional dictation errors.    Willy Eddy, MD 03/07/20 1327

## 2020-03-07 NOTE — ED Notes (Signed)
See triage note. Pt in with continued abdominal discomfort. Tender on R and L side; tender in upper and lower abd. Portion of R/mid/upper abd pushes outward similar to a hernia. Pt denies history of hernia. Abd soft but pt repeatedly tenses up abd muscles when palpated.

## 2020-03-07 NOTE — Anesthesia Preprocedure Evaluation (Addendum)
Anesthesia Evaluation  Patient identified by MRN, date of birth, ID band Patient confused  General Assessment Comment:Patient drowsy, awakens when talking, responds to questions. Somewhat confused Never had anesthesia  Reviewed: Allergy & Precautions, NPO status , Patient's Chart, lab work & pertinent test results  History of Anesthesia Complications Negative for: history of anesthetic complications  Airway Mallampati: III  TM Distance: >3 FB Neck ROM: Limited  Mouth opening: Limited Mouth Opening Comment: Poor effort with mouth opening Large thoracic kyphosis leading to inability to lay fully supine Dental  (+) Teeth Intact, Dental Advisory Given   Pulmonary neg pulmonary ROS, neg sleep apnea, neg COPD, Patient abstained from smoking.Not current smoker,    Pulmonary exam normal breath sounds clear to auscultation       Cardiovascular Exercise Tolerance: Good METShypertension, (-) CAD and (-) Past MI (-) dysrhythmias  Rhythm:Regular Rate:Normal - Systolic murmurs    Neuro/Psych PSYCHIATRIC DISORDERS Dementia Parkinson diseasenegative neurological ROS     GI/Hepatic neg GERD  ,(+)     (-) substance abuse  ,   Endo/Other  neg diabetes  Renal/GU negative Renal ROS     Musculoskeletal  (+) Arthritis ,   Abdominal   Peds  Hematology   Anesthesia Other Findings Past Medical History: No date: Essential hypertension, benign No date: GERD (gastroesophageal reflux disease) No date: Glaucoma No date: Parkinson disease (HCC)  Reproductive/Obstetrics                           Anesthesia Physical Anesthesia Plan  ASA: III  Anesthesia Plan: General   Post-op Pain Management:    Induction: Intravenous and Rapid sequence  PONV Risk Score and Plan: 4 or greater and Ondansetron, Dexamethasone and Treatment may vary due to age or medical condition  Airway Management Planned: Oral ETT  Additional  Equipment: None  Intra-op Plan:   Post-operative Plan: Extubation in OR  Informed Consent: I have reviewed the patients History and Physical, chart, labs and discussed the procedure including the risks, benefits and alternatives for the proposed anesthesia with the patient or authorized representative who has indicated his/her understanding and acceptance.     Dental advisory given and Consent reviewed with POA  Plan Discussed with: CRNA and Surgeon  Anesthesia Plan Comments: (Patient denies nausea or vomiting . Patient somewhat confused.  Discussed risks of anesthesia with  Cleatis Polka, power of attorney and patient's son, including PONV, sore throat, lip/dental damage. Rare risks discussed as well, such as cardiorespiratory and neurological sequelae. [name] understands.)       Anesthesia Quick Evaluation

## 2020-03-07 NOTE — Transfer of Care (Signed)
Immediate Anesthesia Transfer of Care Note  Patient: Cheryl Cummings  Procedure(s) Performed: APPENDECTOMY LAPAROSCOPIC (N/A )  Patient Location: PACU  Anesthesia Type:General  Level of Consciousness: sedated  Airway & Oxygen Therapy: Patient Spontanous Breathing and Patient connected to face mask oxygen  Post-op Assessment: Report given to RN and Post -op Vital signs reviewed and stable  Post vital signs: Reviewed and stable  Last Vitals:  Vitals Value Taken Time  BP    Temp    Pulse 85 03/07/20 2221  Resp    SpO2 95 % 03/07/20 2221  Vitals shown include unvalidated device data.  Last Pain:  Vitals:   03/07/20 1951  TempSrc: Oral  PainSc: 3          Complications: No complications documented.

## 2020-03-07 NOTE — ED Notes (Signed)
Pt called nurse into room using call bell. Pt questioning why she needs surgery and in denial about results of imaging. Educated. Stated will have a provider talk with her further.

## 2020-03-08 ENCOUNTER — Encounter: Payer: Self-pay | Admitting: Surgery

## 2020-03-08 LAB — BASIC METABOLIC PANEL
Anion gap: 13 (ref 5–15)
BUN: 29 mg/dL — ABNORMAL HIGH (ref 8–23)
CO2: 24 mmol/L (ref 22–32)
Calcium: 8.9 mg/dL (ref 8.9–10.3)
Chloride: 106 mmol/L (ref 98–111)
Creatinine, Ser: 0.67 mg/dL (ref 0.44–1.00)
GFR calc Af Amer: >60 mL/min (ref >60)
GFR calc non Af Amer: >60 mL/min (ref >60)
Glucose, Bld: 151 mg/dL — ABNORMAL HIGH (ref 70–99)
Potassium: 4 mmol/L (ref 3.5–5.1)
Sodium: 143 mmol/L (ref 135–145)

## 2020-03-08 LAB — CBC
HCT: 37.8 % (ref 36.0–46.0)
Hemoglobin: 13.4 g/dL (ref 12.0–15.0)
MCH: 33 pg (ref 26.0–34.0)
MCHC: 35.4 g/dL (ref 30.0–36.0)
MCV: 93.1 fL (ref 80.0–100.0)
Platelets: 162 10*3/uL (ref 150–400)
RBC: 4.06 MIL/uL (ref 3.87–5.11)
RDW: 13.3 % (ref 11.5–15.5)
WBC: 7.3 10*3/uL (ref 4.0–10.5)
nRBC: 0 % (ref 0.0–0.2)

## 2020-03-08 LAB — MAGNESIUM: Magnesium: 2.1 mg/dL (ref 1.7–2.4)

## 2020-03-08 MED ORDER — CARBIDOPA-LEVODOPA 25-250 MG PO TABS
1.0000 | ORAL_TABLET | Freq: Three times a day (TID) | ORAL | Status: DC
  Administered 2020-03-09: 1 via ORAL
  Filled 2020-03-08 (×7): qty 1

## 2020-03-08 MED ORDER — LABETALOL HCL 5 MG/ML IV SOLN
5.0000 mg | Freq: Once | INTRAVENOUS | Status: AC
  Administered 2020-03-08: 5 mg via INTRAVENOUS
  Filled 2020-03-08: qty 4

## 2020-03-08 MED ORDER — OXYCODONE HCL 5 MG PO TABS: 5.0000 mg | ORAL_TABLET | Freq: Four times a day (QID) | ORAL | Status: DC | PRN

## 2020-03-08 MED ORDER — SODIUM CHLORIDE 0.9 % IV SOLN
INTRAVENOUS | Status: DC | PRN
  Administered 2020-03-08: 250 mL via INTRAVENOUS

## 2020-03-08 MED ORDER — CARBIDOPA-LEVODOPA 25-250 MG PO TABS
2.0000 | ORAL_TABLET | Freq: Every day | ORAL | Status: DC
  Filled 2020-03-08 (×2): qty 2

## 2020-03-08 NOTE — Anesthesia Postprocedure Evaluation (Signed)
Anesthesia Post Note  Patient: Cheryl Cummings  Procedure(s) Performed: APPENDECTOMY LAPAROSCOPIC (N/A )  Patient location during evaluation: PACU Anesthesia Type: General Level of consciousness: lethargic and patient cooperative (patient at preop level of consciousness) Pain management: pain level controlled Vital Signs Assessment: post-procedure vital signs reviewed and stable Respiratory status: spontaneous breathing, nonlabored ventilation, respiratory function stable and patient connected to nasal cannula oxygen Cardiovascular status: blood pressure returned to baseline and stable Postop Assessment: no apparent nausea or vomiting Anesthetic complications: no   No complications documented.   Last Vitals:  Vitals:   03/08/20 0103 03/08/20 0438  BP: 138/81 (!) 152/90  Pulse: 84 98  Resp: 16 16  Temp: (!) 36.4 C 36.8 C  SpO2: 98% 97%    Last Pain:  Vitals:   03/08/20 0438  TempSrc: Oral  PainSc:                  Corinda Gubler

## 2020-03-08 NOTE — NC FL2 (Signed)
[place] MEDICAID FL2 LEVEL OF CARE SCREENING TOOL     IDENTIFICATION  Patient Name: Cheryl Cummings Birthdate: 09/24/1940 Sex: female Admission Date (Current Location): 03/07/2020  [place] and [place] Number:  Chiropodist and Address:  [facility], [address], [place], [place] [address]      Provider Number: [id number]  Attending Physician Name and Address:  Henrene Dodge, MD  Relative Name and Phone Number:       Current Level of Care: Hospital Recommended Level of Care: Skilled Nursing Facility Prior Approval Number:    Date Approved/Denied:   PASRR Number: [id number]  Discharge Plan: SNF    Current Diagnoses: Patient Active Problem List   Diagnosis Date Noted  . Acute appendicitis 03/07/2020  . Dementia (HCC) 12/18/2018  . Parkinson disease (HCC) 03/14/2017  . Malnutrition of mild degree (HCC) 03/14/2017  . GERD (gastroesophageal reflux disease)   . Glaucoma   . Generalized osteoarthritis of multiple sites     Orientation RESPIRATION BLADDER Height & Weight     Self  Normal Incontinent Weight:   Height:  5\' 6"  (167.6 cm)  BEHAVIORAL SYMPTOMS/MOOD NEUROLOGICAL BOWEL NUTRITION STATUS   (None)  (Parkinson's, Dementia.) Continent Diet (Currently on clear liquids (8/24). Advancing as tolerated. See final diet recommendations on discharge summary when available.)  AMBULATORY STATUS COMMUNICATION OF NEEDS Skin     Verbally Surgical wounds (Incision on abdomen: Dermabond.)                       Personal Care Assistance Level of Assistance              Functional Limitations Info  Sight, Hearing, Speech Sight Info: Adequate (History of Glaucoma. RN said she tracks her in the room.) Hearing Info: Adequate Speech Info: Adequate    SPECIAL CARE FACTORS FREQUENCY                       Contractures Contractures Info: Not present    Additional Factors Info  Code Status, Allergies Code Status Info:  Full code Allergies Info: Nsaids, Diazepam, Ibuprofen, Meperidine, Mushroom Extract Complex, Sour cream, Shellfish Allergy           Current Medications (03/08/2020):  This is the current hospital active medication list Current Facility-Administered Medications  Medication Dose Route Frequency Provider Last Rate Last Admin  . 0.9 %  sodium chloride infusion   Intravenous Once Piscoya, Jose, MD      . acetaminophen (TYLENOL) tablet 1,000 mg  1,000 mg Oral Q6H Piscoya, Jose, MD      . carbidopa-levodopa (SINEMET IR) 25-250 MG per tablet immediate release 2 tablet  2 tablet Oral Q breakfast 03/10/2020, RPH       And  . carbidopa-levodopa (SINEMET IR) 25-250 MG per tablet immediate release 1 tablet  1 tablet Oral TID Lowella Bandy, RPH      . dorzolamide-timolol (COSOPT) 22.3-6.8 MG/ML ophthalmic solution 1 drop  1 drop Both Eyes BID Piscoya, Jose, MD      . enoxaparin (LOVENOX) injection 40 mg  40 mg Subcutaneous Q24H Piscoya, Jose, MD      . lactated ringers infusion   Intravenous Continuous Lowella Bandy, MD   Paused at 03/07/20 1951  . latanoprost (XALATAN) 0.005 % ophthalmic solution 1 drop  1 drop Both Eyes QHS Piscoya, Jose, MD      . morphine 2 MG/ML injection 2 mg  2 mg Intravenous  Q4H PRN Henrene Dodge, MD   2 mg at 03/07/20 1512  . ondansetron (ZOFRAN-ODT) disintegrating tablet 4 mg  4 mg Oral Q6H PRN Piscoya, Jose, MD       Or  . ondansetron (ZOFRAN) injection 4 mg  4 mg Intravenous Q6H PRN Henrene Dodge, MD   4 mg at 03/07/20 1509  . oxyCODONE (Oxy IR/ROXICODONE) immediate release tablet 5 mg  5 mg Oral Q6H PRN Piscoya, Jose, MD      . pantoprazole (PROTONIX) injection 40 mg  40 mg Intravenous QHS Piscoya, Jose, MD      . piperacillin-tazobactam (ZOSYN) IVPB 3.375 g  3.375 g Intravenous Q8H Henrene Dodge, MD   Stopped at 03/08/20 0901  . polyethylene glycol (MIRALAX / GLYCOLAX) packet 17 g  17 g Oral Daily PRN Henrene Dodge, MD         Discharge Medications: Please see  discharge summary for a list of discharge medications.  Relevant Imaging Results:  Relevant Lab Results:   Additional Information SS#: [id number]. Will discharge with a JP drain. COVID test 8/23 negative.  Margarito Liner, LCSW

## 2020-03-08 NOTE — Progress Notes (Signed)
Attempted several times to encourage oral intake and medications to patient. Patient did take a few bites of ice cream for dinner, but then refused anything further.   Madie Reno, RN

## 2020-03-08 NOTE — TOC Initial Note (Addendum)
Transition of Care [facility]) - Initial/Assessment Note    Patient Details  Name: Cheryl Cummings MRN: [id number] Date of Birth: 11/16/1940  [facility] CM/SW Contact:    Margarito Liner, LCSW Phone Number: 03/08/2020, 10:32 AM  Clinical Narrative:  Patient only oriented to self. CSW called son, introduced role, and explained that discharge planning would be discussed. Patient's son confirmed she is a long-term resident at [facility]. She has been there several years. Per attending note, plan for discharge tomorrow. Left message for SNF admissions coordinator to confirm JP drain will not be an issue. No further concerns. CSW encouraged patient's son to contact CSW as needed. CSW will continue to follow patient and her son for support and facilitate return to SNF tomorrow.       11:01 am: [facility] admissions coordinator confirmed JP drain will not be an issue.  Expected Discharge Plan: Skilled Nursing Facility Barriers to Discharge: Continued Medical Work up   Patient Goals and CMS Choice Patient states their goals for this hospitalization and ongoing recovery are:: Patient not fully oriented.   Choice offered to / list presented to : NA  Expected Discharge Plan and Services Expected Discharge Plan: Skilled Nursing Facility     Post Acute Care Choice: Resumption of Svcs/PTA Provider Living arrangements for the past 2 months: Skilled Nursing Facility                                      Prior Living Arrangements/Services Living arrangements for the past 2 months: Skilled Nursing Facility Lives with:: Facility Resident Patient language and need for interpreter reviewed:: Yes Do you feel safe going back to the place where you live?: Yes      Need for Family Participation in Patient Care: Yes (Comment) Care giver support system in place?: Yes (comment)   Criminal Activity/Legal Involvement Pertinent to Current Situation/Hospitalization: No - Comment as  needed  Activities of Daily Living      Permission Sought/Granted Permission sought to share information with : Facility Medical sales representative, Family Supports    Share Information with NAME: Ana Woodroof  Permission granted to share info w AGENCY: [facility]  Permission granted to share info w Relationship: Son  Permission granted to share info w Contact Information: [phone]  Emotional Assessment Appearance:: Appears stated age Attitude/Demeanor/Rapport: Unable to Assess Affect (typically observed): Unable to Assess Orientation: : Oriented to Self Alcohol / Substance Use: Not Applicable Psych Involvement: No (comment)  Admission diagnosis:  Acute appendicitis [K35.80] Acute appendicitis, unspecified acute appendicitis type [K35.80] Patient Active Problem List   Diagnosis Date Noted  . Acute appendicitis 03/07/2020  . Dementia (HCC) 12/18/2018  . Parkinson disease (HCC) 03/14/2017  . Malnutrition of mild degree (HCC) 03/14/2017  . GERD (gastroesophageal reflux disease)   . Glaucoma   . Generalized osteoarthritis of multiple sites    PCP:  [name], MD Pharmacy:   [facility] - [place], [place] - [address] [place] [address] Phone: [phone] Fax: [phone]     Social Determinants of Health (SDOH) Interventions    Readmission Risk Interventions No flowsheet data found.

## 2020-03-08 NOTE — Progress Notes (Signed)
[facility] SURGICAL PROGRESS NOTE  Hospital Day(s): 1.   Post op day(s): 1 Day Post-Op.   Interval History:  Patient seen and examined no acute events or new complaints overnight.  Patient somnolent, but arouses minimally. She has expected soreness No fever, chills, nausea, emesis Remains without leukocytosis Renal function normal, good UO No electrolyte derangement Surgical drain with 115 ccs, somewhat seropurulent  Tolerated CLD  Vital signs in last 24 hours: [min-max] current  Temp:  [97.5 F (36.4 C)-98.3 F (36.8 C)] 98.3 F (36.8 C) (08/24 0438) Pulse Rate:  [82-121] 98 (08/24 0438) Resp:  [12-18] 16 (08/24 0438) BP: (135-175)/(81-144) 152/90 (08/24 0438) SpO2:  [94 %-100 %] 97 % (08/24 0438)     Height: 5\' 6"  (167.6 cm)       Intake/Output last 2 shifts:  08/23 0701 - 08/24 0700 In: 650 [I.V.:600; IV Piggyback:50] Out: 715 [Urine:600; Drains:115]   Physical Exam:  Constitutional: Somnolent but arouses, NAD Respiratory: breathing non-labored at rest  Cardiovascular: regular rate and sinus rhythm  Gastrointestinal: Soft, incisional soreness, and non-distended, no rebound/guaridng, JP in LLQ with seropurulent output Integumentary: Laparoscopic incisions are CDI with dermabond, some ecchymosis, no erythema or drainage   Labs:  CBC Latest Ref Rng & Units 03/08/2020 03/07/2020 01/30/2020  WBC 4.0 - 10.5 K/uL 7.3 7.1 5.0  Hemoglobin 12.0 - 15.0 g/dL 02/01/2020 15.1(H) 13.0  Hematocrit 36 - 46 % 37.8 44.9 39.2  Platelets 150 - 400 K/uL 162 189 162   CMP Latest Ref Rng & Units 03/08/2020 03/07/2020 01/30/2020  Glucose 70 - 99 mg/dL 02/01/2020) 382(N) 91  BUN 8 - 23 mg/dL 053(Z) 76(B) 34(L)  Creatinine 0.44 - 1.00 mg/dL 93(X 9.02 4.09  Sodium 135 - 145 mmol/L 143 141 140  Potassium 3.5 - 5.1 mmol/L 4.0 3.7 4.0  Chloride 98 - 111 mmol/L 106 104 106  CO2 22 - 32 mmol/L 24 27 25   Calcium 8.9 - 10.3 mg/dL 8.9 9.3 9.1  Total Protein 6.5 - 8.1 g/dL - 7.3 6.3(L)  Total  Bilirubin 0.3 - 1.2 mg/dL - 1.0 1.0  Alkaline Phos 38 - 126 U/L - 59 48  AST 15 - 41 U/L - 20 19  ALT 0 - 44 U/L - 12 11    Imaging studies: No new pertinent imaging studies   Assessment/Plan:  79 y.o. female with expected post-op soreness 1 Day Post-Op s/p laparoscopic appendectomy for acute appendicitis with perforation and seropurulent fluid in abdomen, complicated by pertinent comorbidities including history of Parkinson's Dementia.   - Advance diet as tolerated today  - Wean from IVF  - Continue IV Abx (Zosyn)  - Maintain surgical drain; monitor and record output; she will DC with this  - Monitor abdominal examination; on-going bowel function  - Pain control prn; antiemetics prn  - Continue home medications  - Mobilization encouraged    - Discharge Planning: Will plan on discharge home in the AM, will reach out to [facility] team to ensure she will be able to return to [place] [facility] with drain   All of the above findings and recommendations were discussed with the patient, and the medical team.  -- 96, PA-C  [facility] 03/08/2020, 8:16 AM [phone] M-F: 7am - 4pm

## 2020-03-09 LAB — SURGICAL PATHOLOGY

## 2020-03-09 MED ORDER — METOPROLOL TARTRATE 5 MG/5ML IV SOLN
5.0000 mg | INTRAVENOUS | Status: DC
  Administered 2020-03-09: 5 mg via INTRAVENOUS
  Filled 2020-03-09: qty 5

## 2020-03-09 MED ORDER — METOPROLOL TARTRATE 5 MG/5ML IV SOLN
5.0000 mg | Freq: Four times a day (QID) | INTRAVENOUS | Status: DC | PRN
  Administered 2020-03-09: 5 mg via INTRAVENOUS
  Filled 2020-03-09: qty 5

## 2020-03-09 MED ORDER — AMOXICILLIN-POT CLAVULANATE 875-125 MG PO TABS: 1.0000 | ORAL_TABLET | Freq: Two times a day (BID) | ORAL | 0 refills | Status: AC

## 2020-03-09 MED ORDER — HYDRALAZINE HCL 10 MG PO TABS
10.0000 mg | ORAL_TABLET | Freq: Four times a day (QID) | ORAL | Status: DC | PRN
  Administered 2020-03-09: 10 mg via ORAL
  Filled 2020-03-09 (×3): qty 1

## 2020-03-09 MED ORDER — OXYCODONE HCL 5 MG PO TABS
5.0000 mg | ORAL_TABLET | Freq: Four times a day (QID) | ORAL | 0 refills | Status: DC | PRN
Start: 2020-03-09 — End: 2020-11-04

## 2020-03-09 MED ORDER — AMLODIPINE BESYLATE 5 MG PO TABS
5.0000 mg | ORAL_TABLET | Freq: Once | ORAL | Status: AC
  Administered 2020-03-09: 5 mg via ORAL
  Filled 2020-03-09: qty 1

## 2020-03-09 NOTE — TOC Transition Note (Addendum)
Transition of Care [facility]) - CM/SW Discharge Note   Patient Details  Name: JOELYN LOVER MRN: [id number] Date of Birth: Nov 26, 1940  [facility] CM/SW Contact:  Margarito Liner, LCSW Phone Number: 03/09/2020, 9:53 AM   Clinical Narrative:  Patient has orders to discharge to [facility] today. RN will call report to [phone] (Room 329). EMS has been set up for 10:15 am. No further concerns. CSW signing off.   3:04 pm: Per RN, blood pressure has come down. Surgical PA said it's okay to set up EMS transport. Called and set it up. SNF admissions coordinator aware. CSW signing off.  Final next level of care: Skilled Nursing Facility Barriers to Discharge: Barriers Resolved   Patient Goals and CMS Choice Patient states their goals for this hospitalization and ongoing recovery are:: Patient not fully oriented.   Choice offered to / list presented to : NA  Discharge Placement   Existing PASRR number confirmed : 03/08/20          Patient chooses bed at: [facility] Patient to be transferred to facility by: EMS Name of family member notified: Cleatis Polka Patient and family notified of of transfer: 03/09/20  Discharge Plan and Services     Post Acute Care Choice: Resumption of Svcs/PTA Provider                               Social Determinants of Health (SDOH) Interventions     Readmission Risk Interventions No flowsheet data found.

## 2020-03-09 NOTE — Discharge Summary (Signed)
[facility] SURGICAL DISCHARGE SUMMARY  Patient ID: Cheryl Cummings MRN: [id number] DOB/AGE: 1940/12/29 79 y.o.  Admit date: 03/07/2020 Discharge date: 03/09/2020  Discharge Diagnoses Patient Active Problem List   Diagnosis Date Noted  . Acute appendicitis 03/07/2020  . Dementia (HCC) 12/18/2018  . Parkinson disease (HCC) 03/14/2017    Consultants None  Procedures 03/07/2020:  Laparoscopic Appendectomy  HPI: Cheryl Cummings is a 79 y.o. female presenting with a 3-4 day history of abdominal pain.  Patient reports the pain is in the right lower quadrant.  Denies any fevers or chills.  Denies any nausea or vomiting.  The pain radiates towards her pelvis.  Denies any dysuria.  In the ED, her laboratory workup was overall unremarkable, with normal WBC and Cr.  CT scan however, showed acute appendicitis, with inflamed appendix with appendicolith, surrounding fat stranding, and free fluid with layering in the pelvis.  CT also showed fecal impaction, but she had a BM in the ED this afternoon.  She typically has a BM every 3 days or so. Her son, [name], is DPOA.  The patient is confused, and per ED physician, had mentioned to him that he was partying.  With me, she has mentioned that she's unsure about surgery until she talks with her children, and repeats questions frequently about surgery or antibiotics or diet.  Hospital Course:Informed consent was obtained and documented, and patient underwent uneventful laparoscopic appendectomy (Dr Aleen Campi, 03/07/2020).  Post-operatively, patient did reasonably well but this was challenging to assess given her dementia. Given the concern for perforated appendicitis intraoperatively she did stay 1 additional day for IV ABx.  Advancement of patient's diet and ambulation were reasonably tolerated. The remainder of patient's hospital course was essentially unremarkable, and discharge planning was initiated accordingly with patient safely able  to be discharged home with appropriate discharge instructions, antibiotics (Augmentin x10 days), pain control.   Discharge Condition: Good   Physical Examination:  Constitutional: Alert, no distress, markedly disoriented to place/time/situation, delusional, paranoid.  Respiratory: breathing non-labored at rest  Cardiovascular: regular rate and sinus rhythm  Gastrointestinal: Soft, incisional soreness, and non-distended, no rebound/guaridng, JP in LLQ and output more serous this morning Integumentary: Laparoscopic incisions are CDI with dermabond, some ecchymosis, no erythema or drainage     Allergies as of 03/09/2020      Reactions   Nsaids Anaphylaxis   Diazepam Other (See Comments)   Unknown Other reaction(s): Unknown Unknown Other Reaction: OTHER REACTION   Ibuprofen Swelling   Meperidine Other (See Comments)   Other Reaction: OTHER REACTION   Mushroom Extract Complex Nausea And Vomiting   Other Diarrhea   Sour Cream   Shellfish Allergy Rash      Medication List    TAKE these medications   acetaminophen 325 MG tablet Commonly known as: TYLENOL Take 325-650 mg by mouth every 4 (four) hours as needed for mild pain or fever.   amoxicillin-clavulanate 875-125 MG tablet Commonly known as: Augmentin Take 1 tablet by mouth 2 (two) times daily for 10 days.   carbamide peroxide 6.5 % OTIC solution Commonly known as: DEBROX Place 5 drops into both ears 2 (two) times daily as needed (wax buildup).   Carbidopa-Levodopa ER 25-100 MG tablet controlled release Commonly known as: SINEMET CR Take 1 tablet by mouth 2 (two) times daily.   carbidopa-levodopa 25-100 MG tablet Commonly known as: SINEMET IR Take 1-2 tablets by mouth in the morning, at noon, in the evening, and at bedtime.   cetirizine 10  MG tablet Commonly known as: ZYRTEC Take 10 mg by mouth daily as needed for allergies.   diphenhydrAMINE 25 MG tablet Commonly known as: BENADRYL Take 25 mg by mouth every 6  (six) hours as needed for itching or allergies.   dorzolamide-timolol 22.3-6.8 MG/ML ophthalmic solution Commonly known as: COSOPT Place 1 drop into both eyes 2 (two) times daily.   Kaopectate 262 MG/15ML suspension Generic drug: bismuth subsalicylate Take 10 mLs by mouth every 6 (six) hours as needed for indigestion or diarrhea or loose stools.   Milk of Magnesia 400 MG/5ML suspension Generic drug: magnesium hydroxide Take 30 mLs by mouth at bedtime as needed for mild constipation or moderate constipation.   Mintox 200-200-20 MG/5ML suspension Generic drug: alum & mag hydroxide-simeth Take 30 mLs by mouth every 6 (six) hours as needed for indigestion or heartburn.   multivitamin with minerals tablet Take 1 tablet by mouth daily.   nitroGLYCERIN 0.4 MG SL tablet Commonly known as: NITROSTAT Place 0.4 mg under the tongue as needed.   ondansetron 4 MG tablet Commonly known as: ZOFRAN Take 4 mg by mouth every 8 (eight) hours as needed for nausea or vomiting.   oxyCODONE 5 MG immediate release tablet Commonly known as: Oxy IR/ROXICODONE Take 1 tablet (5 mg total) by mouth every 6 (six) hours as needed for severe pain or breakthrough pain.   Travatan Z 0.004 % Soln ophthalmic solution Generic drug: Travoprost (BAK Free) Place 1 drop into both eyes at bedtime.   Tussin DM 10-100 MG/5ML liquid Generic drug: dextromethorphan-guaiFENesin Take 10 mLs by mouth every 4 (four) hours as needed for cough.   Vitamin D (Ergocalciferol) 1.25 MG (50000 UNIT) Caps capsule Commonly known as: DRISDOL Take 1 capsule (50,000 Units total) by mouth every 30 (thirty) days. What changed:   when to take this  additional instructions         Contact information for follow-up providers    Henrene Dodge, MD. Schedule an appointment as soon as possible for a visit on 03/21/2020.   Specialty: General Surgery Why: s/p laparoscopic appendectomy, has drain Contact information: [address] [place] [address] [phone]            Contact information for after-discharge care    Destination    [facility] .   Service: Skilled Nursing Contact information: [address] [place] [address] [phone]                   Time spent on discharge management including discussion of hospital course, clinical condition, outpatient instructions, prescriptions, and follow up with the patient and members of the medical team: >30 minutes  -- Lynden Oxford , PA-C  [facility]  03/09/2020, 9:30 AM [phone] M-F: 7am - 4pm

## 2020-03-09 NOTE — Progress Notes (Signed)
Attempted to call sons home phone twice today but was unable to reach him.

## 2020-03-09 NOTE — Progress Notes (Signed)
Cheryl Cummings A and O x1. VSS. Pt tolerating diet well. No complaints of nausea or vomiting. IV removed intact, prescriptions given. Discharge sent with patient. Patient discharged with EMS    Vitals:   03/09/20 1420 03/09/20 1500  BP: (!) 175/100 (!) 142/94  Pulse: 90   Resp:    Temp:    SpO2:      [name]

## 2020-03-10 DIAGNOSIS — F22 Delusional disorders: Secondary | ICD-10-CM | POA: Diagnosis not present

## 2020-03-10 DIAGNOSIS — K659 Peritonitis, unspecified: Secondary | ICD-10-CM | POA: Diagnosis not present

## 2020-03-10 DIAGNOSIS — R2681 Unsteadiness on feet: Secondary | ICD-10-CM | POA: Diagnosis not present

## 2020-03-10 DIAGNOSIS — G20 Parkinson's disease: Secondary | ICD-10-CM | POA: Diagnosis not present

## 2020-03-11 DIAGNOSIS — R2681 Unsteadiness on feet: Secondary | ICD-10-CM | POA: Diagnosis not present

## 2020-03-11 DIAGNOSIS — G20 Parkinson's disease: Secondary | ICD-10-CM | POA: Diagnosis not present

## 2020-03-14 ENCOUNTER — Encounter: Payer: Self-pay | Admitting: Surgery

## 2020-03-14 ENCOUNTER — Other Ambulatory Visit: Payer: Self-pay

## 2020-03-14 ENCOUNTER — Ambulatory Visit (INDEPENDENT_AMBULATORY_CARE_PROVIDER_SITE_OTHER): Payer: Medicare Other | Admitting: Surgery

## 2020-03-14 VITALS — BP 116/80 | HR 102 | Temp 97.9°F

## 2020-03-14 DIAGNOSIS — K3580 Unspecified acute appendicitis: Secondary | ICD-10-CM

## 2020-03-14 DIAGNOSIS — Z09 Encounter for follow-up examination after completed treatment for conditions other than malignant neoplasm: Secondary | ICD-10-CM

## 2020-03-14 NOTE — Patient Instructions (Signed)
Do daily dressing changes with dry gauze and tape to drain site until this is healed. Keep the area clean and dry. May have some pain medication for this today.  Follow-up with our office as needed.  Please call and ask to speak with a nurse if you develop questions or concerns.

## 2020-03-14 NOTE — Progress Notes (Signed)
03/14/2020  HPI: Cheryl Cummings is a 79 y.o. female s/p laparoscopic appendectomy on 03/07/2020.  Patient presents today for follow-up.  She has a Landscape architect drain in place.  Looking at her nursing home notes, it has been draining about 30 mL/day over the last 2 days.  Denies any nausea, vomiting.  She is not eating much because she has low appetite.  Vital signs: BP 116/80    Pulse (!) 102    Temp 97.9 F (36.6 C)    SpO2 94%    Physical Exam: Constitutional: No acute distress Abdomen:  Soft, non-distended, appropriately sore to palpation.  Incisions healing well, clean, dry, intact, with some minimal ecchymosis.  Drain in place in LLQ with serosanguinous fluid.    Assessment/Plan: This is a 79 y.o. female s/p laparoscopic appendectomy.  --Blake drain removed today without complication.  Dry gauze dressing applied.  Instructed to change once daily until wound fully sealed. --Continue antibiotic course until completed.  --Follow up prn.   Howie Ill, MD [facility]

## 2020-03-15 DIAGNOSIS — G20 Parkinson's disease: Secondary | ICD-10-CM | POA: Diagnosis not present

## 2020-03-15 DIAGNOSIS — R2681 Unsteadiness on feet: Secondary | ICD-10-CM | POA: Diagnosis not present

## 2020-03-16 DIAGNOSIS — R2681 Unsteadiness on feet: Secondary | ICD-10-CM | POA: Diagnosis not present

## 2020-03-16 DIAGNOSIS — F0280 Dementia in other diseases classified elsewhere without behavioral disturbance: Secondary | ICD-10-CM | POA: Diagnosis not present

## 2020-03-16 DIAGNOSIS — G20 Parkinson's disease: Secondary | ICD-10-CM | POA: Diagnosis not present

## 2020-03-16 DIAGNOSIS — K3580 Unspecified acute appendicitis: Secondary | ICD-10-CM | POA: Diagnosis not present

## 2020-03-16 DIAGNOSIS — M6281 Muscle weakness (generalized): Secondary | ICD-10-CM | POA: Diagnosis not present

## 2020-03-16 DIAGNOSIS — R278 Other lack of coordination: Secondary | ICD-10-CM | POA: Diagnosis not present

## 2020-03-16 DIAGNOSIS — R4189 Other symptoms and signs involving cognitive functions and awareness: Secondary | ICD-10-CM | POA: Diagnosis not present

## 2020-03-16 DIAGNOSIS — Z741 Need for assistance with personal care: Secondary | ICD-10-CM | POA: Diagnosis not present

## 2020-03-17 DIAGNOSIS — F0280 Dementia in other diseases classified elsewhere without behavioral disturbance: Secondary | ICD-10-CM | POA: Diagnosis not present

## 2020-03-17 DIAGNOSIS — Z741 Need for assistance with personal care: Secondary | ICD-10-CM | POA: Diagnosis not present

## 2020-03-17 DIAGNOSIS — R278 Other lack of coordination: Secondary | ICD-10-CM | POA: Diagnosis not present

## 2020-03-17 DIAGNOSIS — G20 Parkinson's disease: Secondary | ICD-10-CM | POA: Diagnosis not present

## 2020-03-17 DIAGNOSIS — R2681 Unsteadiness on feet: Secondary | ICD-10-CM | POA: Diagnosis not present

## 2020-03-17 DIAGNOSIS — R4189 Other symptoms and signs involving cognitive functions and awareness: Secondary | ICD-10-CM | POA: Diagnosis not present

## 2020-03-18 ENCOUNTER — Encounter: Payer: Self-pay | Admitting: Surgery

## 2020-03-18 DIAGNOSIS — Z741 Need for assistance with personal care: Secondary | ICD-10-CM | POA: Diagnosis not present

## 2020-03-18 DIAGNOSIS — R278 Other lack of coordination: Secondary | ICD-10-CM | POA: Diagnosis not present

## 2020-03-18 DIAGNOSIS — G20 Parkinson's disease: Secondary | ICD-10-CM | POA: Diagnosis not present

## 2020-03-18 DIAGNOSIS — F0280 Dementia in other diseases classified elsewhere without behavioral disturbance: Secondary | ICD-10-CM | POA: Diagnosis not present

## 2020-03-18 DIAGNOSIS — R2681 Unsteadiness on feet: Secondary | ICD-10-CM | POA: Diagnosis not present

## 2020-03-18 DIAGNOSIS — R4189 Other symptoms and signs involving cognitive functions and awareness: Secondary | ICD-10-CM | POA: Diagnosis not present

## 2020-03-21 DIAGNOSIS — Z741 Need for assistance with personal care: Secondary | ICD-10-CM | POA: Diagnosis not present

## 2020-03-21 DIAGNOSIS — R2681 Unsteadiness on feet: Secondary | ICD-10-CM | POA: Diagnosis not present

## 2020-03-21 DIAGNOSIS — R278 Other lack of coordination: Secondary | ICD-10-CM | POA: Diagnosis not present

## 2020-03-21 DIAGNOSIS — G20 Parkinson's disease: Secondary | ICD-10-CM | POA: Diagnosis not present

## 2020-03-21 DIAGNOSIS — R4189 Other symptoms and signs involving cognitive functions and awareness: Secondary | ICD-10-CM | POA: Diagnosis not present

## 2020-03-21 DIAGNOSIS — F0280 Dementia in other diseases classified elsewhere without behavioral disturbance: Secondary | ICD-10-CM | POA: Diagnosis not present

## 2020-03-22 DIAGNOSIS — F0280 Dementia in other diseases classified elsewhere without behavioral disturbance: Secondary | ICD-10-CM | POA: Diagnosis not present

## 2020-03-22 DIAGNOSIS — R278 Other lack of coordination: Secondary | ICD-10-CM | POA: Diagnosis not present

## 2020-03-22 DIAGNOSIS — R4189 Other symptoms and signs involving cognitive functions and awareness: Secondary | ICD-10-CM | POA: Diagnosis not present

## 2020-03-22 DIAGNOSIS — Z741 Need for assistance with personal care: Secondary | ICD-10-CM | POA: Diagnosis not present

## 2020-03-22 DIAGNOSIS — R2681 Unsteadiness on feet: Secondary | ICD-10-CM | POA: Diagnosis not present

## 2020-03-22 DIAGNOSIS — G20 Parkinson's disease: Secondary | ICD-10-CM | POA: Diagnosis not present

## 2020-03-23 DIAGNOSIS — R4189 Other symptoms and signs involving cognitive functions and awareness: Secondary | ICD-10-CM | POA: Diagnosis not present

## 2020-03-23 DIAGNOSIS — F0280 Dementia in other diseases classified elsewhere without behavioral disturbance: Secondary | ICD-10-CM | POA: Diagnosis not present

## 2020-03-23 DIAGNOSIS — R2681 Unsteadiness on feet: Secondary | ICD-10-CM | POA: Diagnosis not present

## 2020-03-23 DIAGNOSIS — G20 Parkinson's disease: Secondary | ICD-10-CM | POA: Diagnosis not present

## 2020-03-23 DIAGNOSIS — Z741 Need for assistance with personal care: Secondary | ICD-10-CM | POA: Diagnosis not present

## 2020-03-23 DIAGNOSIS — R278 Other lack of coordination: Secondary | ICD-10-CM | POA: Diagnosis not present

## 2020-03-24 DIAGNOSIS — F0280 Dementia in other diseases classified elsewhere without behavioral disturbance: Secondary | ICD-10-CM | POA: Diagnosis not present

## 2020-03-24 DIAGNOSIS — Z741 Need for assistance with personal care: Secondary | ICD-10-CM | POA: Diagnosis not present

## 2020-03-24 DIAGNOSIS — R278 Other lack of coordination: Secondary | ICD-10-CM | POA: Diagnosis not present

## 2020-03-24 DIAGNOSIS — R2681 Unsteadiness on feet: Secondary | ICD-10-CM | POA: Diagnosis not present

## 2020-03-24 DIAGNOSIS — G20 Parkinson's disease: Secondary | ICD-10-CM | POA: Diagnosis not present

## 2020-03-24 DIAGNOSIS — R4189 Other symptoms and signs involving cognitive functions and awareness: Secondary | ICD-10-CM | POA: Diagnosis not present

## 2020-03-28 DIAGNOSIS — R2681 Unsteadiness on feet: Secondary | ICD-10-CM | POA: Diagnosis not present

## 2020-03-28 DIAGNOSIS — R4189 Other symptoms and signs involving cognitive functions and awareness: Secondary | ICD-10-CM | POA: Diagnosis not present

## 2020-03-28 DIAGNOSIS — Z741 Need for assistance with personal care: Secondary | ICD-10-CM | POA: Diagnosis not present

## 2020-03-28 DIAGNOSIS — G20 Parkinson's disease: Secondary | ICD-10-CM | POA: Diagnosis not present

## 2020-03-28 DIAGNOSIS — R278 Other lack of coordination: Secondary | ICD-10-CM | POA: Diagnosis not present

## 2020-03-28 DIAGNOSIS — F0280 Dementia in other diseases classified elsewhere without behavioral disturbance: Secondary | ICD-10-CM | POA: Diagnosis not present

## 2020-03-28 DIAGNOSIS — F22 Delusional disorders: Secondary | ICD-10-CM | POA: Diagnosis not present

## 2020-03-29 DIAGNOSIS — M5136 Other intervertebral disc degeneration, lumbar region: Secondary | ICD-10-CM | POA: Diagnosis not present

## 2020-03-29 DIAGNOSIS — F0280 Dementia in other diseases classified elsewhere without behavioral disturbance: Secondary | ICD-10-CM | POA: Diagnosis not present

## 2020-03-29 DIAGNOSIS — R4189 Other symptoms and signs involving cognitive functions and awareness: Secondary | ICD-10-CM | POA: Diagnosis not present

## 2020-03-29 DIAGNOSIS — R2681 Unsteadiness on feet: Secondary | ICD-10-CM | POA: Diagnosis not present

## 2020-03-29 DIAGNOSIS — G20 Parkinson's disease: Secondary | ICD-10-CM | POA: Diagnosis not present

## 2020-03-29 DIAGNOSIS — R278 Other lack of coordination: Secondary | ICD-10-CM | POA: Diagnosis not present

## 2020-03-29 DIAGNOSIS — Z741 Need for assistance with personal care: Secondary | ICD-10-CM | POA: Diagnosis not present

## 2020-03-29 DIAGNOSIS — M5134 Other intervertebral disc degeneration, thoracic region: Secondary | ICD-10-CM | POA: Diagnosis not present

## 2020-03-30 DIAGNOSIS — G20 Parkinson's disease: Secondary | ICD-10-CM | POA: Diagnosis not present

## 2020-03-30 DIAGNOSIS — R278 Other lack of coordination: Secondary | ICD-10-CM | POA: Diagnosis not present

## 2020-03-30 DIAGNOSIS — S22000A Wedge compression fracture of unspecified thoracic vertebra, initial encounter for closed fracture: Secondary | ICD-10-CM | POA: Diagnosis not present

## 2020-03-30 DIAGNOSIS — R2681 Unsteadiness on feet: Secondary | ICD-10-CM | POA: Diagnosis not present

## 2020-03-30 DIAGNOSIS — R4189 Other symptoms and signs involving cognitive functions and awareness: Secondary | ICD-10-CM | POA: Diagnosis not present

## 2020-03-30 DIAGNOSIS — M40209 Unspecified kyphosis, site unspecified: Secondary | ICD-10-CM | POA: Diagnosis not present

## 2020-03-30 DIAGNOSIS — Z741 Need for assistance with personal care: Secondary | ICD-10-CM | POA: Diagnosis not present

## 2020-03-30 DIAGNOSIS — F0280 Dementia in other diseases classified elsewhere without behavioral disturbance: Secondary | ICD-10-CM | POA: Diagnosis not present

## 2020-03-30 DIAGNOSIS — M4126 Other idiopathic scoliosis, lumbar region: Secondary | ICD-10-CM | POA: Diagnosis not present

## 2020-03-31 DIAGNOSIS — R4189 Other symptoms and signs involving cognitive functions and awareness: Secondary | ICD-10-CM | POA: Diagnosis not present

## 2020-03-31 DIAGNOSIS — G20 Parkinson's disease: Secondary | ICD-10-CM | POA: Diagnosis not present

## 2020-03-31 DIAGNOSIS — Z741 Need for assistance with personal care: Secondary | ICD-10-CM | POA: Diagnosis not present

## 2020-03-31 DIAGNOSIS — R278 Other lack of coordination: Secondary | ICD-10-CM | POA: Diagnosis not present

## 2020-03-31 DIAGNOSIS — R2681 Unsteadiness on feet: Secondary | ICD-10-CM | POA: Diagnosis not present

## 2020-03-31 DIAGNOSIS — F0280 Dementia in other diseases classified elsewhere without behavioral disturbance: Secondary | ICD-10-CM | POA: Diagnosis not present

## 2020-04-01 DIAGNOSIS — G20 Parkinson's disease: Secondary | ICD-10-CM | POA: Diagnosis not present

## 2020-04-01 DIAGNOSIS — R2681 Unsteadiness on feet: Secondary | ICD-10-CM | POA: Diagnosis not present

## 2020-04-01 DIAGNOSIS — Z741 Need for assistance with personal care: Secondary | ICD-10-CM | POA: Diagnosis not present

## 2020-04-01 DIAGNOSIS — F0280 Dementia in other diseases classified elsewhere without behavioral disturbance: Secondary | ICD-10-CM | POA: Diagnosis not present

## 2020-04-01 DIAGNOSIS — R278 Other lack of coordination: Secondary | ICD-10-CM | POA: Diagnosis not present

## 2020-04-01 DIAGNOSIS — R4189 Other symptoms and signs involving cognitive functions and awareness: Secondary | ICD-10-CM | POA: Diagnosis not present

## 2020-04-04 DIAGNOSIS — F0280 Dementia in other diseases classified elsewhere without behavioral disturbance: Secondary | ICD-10-CM | POA: Diagnosis not present

## 2020-04-04 DIAGNOSIS — R278 Other lack of coordination: Secondary | ICD-10-CM | POA: Diagnosis not present

## 2020-04-04 DIAGNOSIS — Z741 Need for assistance with personal care: Secondary | ICD-10-CM | POA: Diagnosis not present

## 2020-04-04 DIAGNOSIS — R4189 Other symptoms and signs involving cognitive functions and awareness: Secondary | ICD-10-CM | POA: Diagnosis not present

## 2020-04-04 DIAGNOSIS — R2681 Unsteadiness on feet: Secondary | ICD-10-CM | POA: Diagnosis not present

## 2020-04-04 DIAGNOSIS — G20 Parkinson's disease: Secondary | ICD-10-CM | POA: Diagnosis not present

## 2020-04-05 DIAGNOSIS — G3183 Dementia with Lewy bodies: Secondary | ICD-10-CM | POA: Diagnosis not present

## 2020-04-05 DIAGNOSIS — Z741 Need for assistance with personal care: Secondary | ICD-10-CM | POA: Diagnosis not present

## 2020-04-05 DIAGNOSIS — E440 Moderate protein-calorie malnutrition: Secondary | ICD-10-CM | POA: Diagnosis not present

## 2020-04-05 DIAGNOSIS — R2681 Unsteadiness on feet: Secondary | ICD-10-CM | POA: Diagnosis not present

## 2020-04-05 DIAGNOSIS — R278 Other lack of coordination: Secondary | ICD-10-CM | POA: Diagnosis not present

## 2020-04-05 DIAGNOSIS — F22 Delusional disorders: Secondary | ICD-10-CM | POA: Diagnosis not present

## 2020-04-05 DIAGNOSIS — M159 Polyosteoarthritis, unspecified: Secondary | ICD-10-CM | POA: Diagnosis not present

## 2020-04-05 DIAGNOSIS — G20 Parkinson's disease: Secondary | ICD-10-CM | POA: Diagnosis not present

## 2020-04-05 DIAGNOSIS — F0280 Dementia in other diseases classified elsewhere without behavioral disturbance: Secondary | ICD-10-CM | POA: Diagnosis not present

## 2020-04-05 DIAGNOSIS — R4189 Other symptoms and signs involving cognitive functions and awareness: Secondary | ICD-10-CM | POA: Diagnosis not present

## 2020-04-06 DIAGNOSIS — R4189 Other symptoms and signs involving cognitive functions and awareness: Secondary | ICD-10-CM | POA: Diagnosis not present

## 2020-04-06 DIAGNOSIS — R278 Other lack of coordination: Secondary | ICD-10-CM | POA: Diagnosis not present

## 2020-04-06 DIAGNOSIS — G20 Parkinson's disease: Secondary | ICD-10-CM | POA: Diagnosis not present

## 2020-04-06 DIAGNOSIS — R2681 Unsteadiness on feet: Secondary | ICD-10-CM | POA: Diagnosis not present

## 2020-04-06 DIAGNOSIS — F0280 Dementia in other diseases classified elsewhere without behavioral disturbance: Secondary | ICD-10-CM | POA: Diagnosis not present

## 2020-04-06 DIAGNOSIS — Z741 Need for assistance with personal care: Secondary | ICD-10-CM | POA: Diagnosis not present

## 2020-04-07 DIAGNOSIS — F0280 Dementia in other diseases classified elsewhere without behavioral disturbance: Secondary | ICD-10-CM | POA: Diagnosis not present

## 2020-04-07 DIAGNOSIS — R278 Other lack of coordination: Secondary | ICD-10-CM | POA: Diagnosis not present

## 2020-04-07 DIAGNOSIS — R2681 Unsteadiness on feet: Secondary | ICD-10-CM | POA: Diagnosis not present

## 2020-04-07 DIAGNOSIS — Z741 Need for assistance with personal care: Secondary | ICD-10-CM | POA: Diagnosis not present

## 2020-04-07 DIAGNOSIS — G20 Parkinson's disease: Secondary | ICD-10-CM | POA: Diagnosis not present

## 2020-04-07 DIAGNOSIS — R4189 Other symptoms and signs involving cognitive functions and awareness: Secondary | ICD-10-CM | POA: Diagnosis not present

## 2020-04-08 DIAGNOSIS — R4189 Other symptoms and signs involving cognitive functions and awareness: Secondary | ICD-10-CM | POA: Diagnosis not present

## 2020-04-08 DIAGNOSIS — Y92129 Unspecified place in nursing home as the place of occurrence of the external cause: Secondary | ICD-10-CM | POA: Diagnosis not present

## 2020-04-08 DIAGNOSIS — R278 Other lack of coordination: Secondary | ICD-10-CM | POA: Diagnosis not present

## 2020-04-08 DIAGNOSIS — R2681 Unsteadiness on feet: Secondary | ICD-10-CM | POA: Diagnosis not present

## 2020-04-08 DIAGNOSIS — Z741 Need for assistance with personal care: Secondary | ICD-10-CM | POA: Diagnosis not present

## 2020-04-08 DIAGNOSIS — F0280 Dementia in other diseases classified elsewhere without behavioral disturbance: Secondary | ICD-10-CM | POA: Diagnosis not present

## 2020-04-08 DIAGNOSIS — S0003XA Contusion of scalp, initial encounter: Secondary | ICD-10-CM | POA: Diagnosis not present

## 2020-04-08 DIAGNOSIS — G20 Parkinson's disease: Secondary | ICD-10-CM | POA: Diagnosis not present

## 2020-04-11 DIAGNOSIS — R2681 Unsteadiness on feet: Secondary | ICD-10-CM | POA: Diagnosis not present

## 2020-04-11 DIAGNOSIS — G20 Parkinson's disease: Secondary | ICD-10-CM | POA: Diagnosis not present

## 2020-04-11 DIAGNOSIS — R278 Other lack of coordination: Secondary | ICD-10-CM | POA: Diagnosis not present

## 2020-04-11 DIAGNOSIS — F0280 Dementia in other diseases classified elsewhere without behavioral disturbance: Secondary | ICD-10-CM | POA: Diagnosis not present

## 2020-04-11 DIAGNOSIS — R4189 Other symptoms and signs involving cognitive functions and awareness: Secondary | ICD-10-CM | POA: Diagnosis not present

## 2020-04-11 DIAGNOSIS — Z741 Need for assistance with personal care: Secondary | ICD-10-CM | POA: Diagnosis not present

## 2020-04-12 DIAGNOSIS — G20 Parkinson's disease: Secondary | ICD-10-CM | POA: Diagnosis not present

## 2020-04-12 DIAGNOSIS — F0280 Dementia in other diseases classified elsewhere without behavioral disturbance: Secondary | ICD-10-CM | POA: Diagnosis not present

## 2020-04-12 DIAGNOSIS — R2681 Unsteadiness on feet: Secondary | ICD-10-CM | POA: Diagnosis not present

## 2020-04-12 DIAGNOSIS — R4189 Other symptoms and signs involving cognitive functions and awareness: Secondary | ICD-10-CM | POA: Diagnosis not present

## 2020-04-12 DIAGNOSIS — Z741 Need for assistance with personal care: Secondary | ICD-10-CM | POA: Diagnosis not present

## 2020-04-12 DIAGNOSIS — R278 Other lack of coordination: Secondary | ICD-10-CM | POA: Diagnosis not present

## 2020-04-13 DIAGNOSIS — G20 Parkinson's disease: Secondary | ICD-10-CM | POA: Diagnosis not present

## 2020-04-13 DIAGNOSIS — Z741 Need for assistance with personal care: Secondary | ICD-10-CM | POA: Diagnosis not present

## 2020-04-13 DIAGNOSIS — R2681 Unsteadiness on feet: Secondary | ICD-10-CM | POA: Diagnosis not present

## 2020-04-13 DIAGNOSIS — F0280 Dementia in other diseases classified elsewhere without behavioral disturbance: Secondary | ICD-10-CM | POA: Diagnosis not present

## 2020-04-13 DIAGNOSIS — R4189 Other symptoms and signs involving cognitive functions and awareness: Secondary | ICD-10-CM | POA: Diagnosis not present

## 2020-04-13 DIAGNOSIS — R278 Other lack of coordination: Secondary | ICD-10-CM | POA: Diagnosis not present

## 2020-04-14 DIAGNOSIS — F0280 Dementia in other diseases classified elsewhere without behavioral disturbance: Secondary | ICD-10-CM | POA: Diagnosis not present

## 2020-04-14 DIAGNOSIS — G20 Parkinson's disease: Secondary | ICD-10-CM | POA: Diagnosis not present

## 2020-04-14 DIAGNOSIS — R278 Other lack of coordination: Secondary | ICD-10-CM | POA: Diagnosis not present

## 2020-04-14 DIAGNOSIS — R4189 Other symptoms and signs involving cognitive functions and awareness: Secondary | ICD-10-CM | POA: Diagnosis not present

## 2020-04-14 DIAGNOSIS — R2681 Unsteadiness on feet: Secondary | ICD-10-CM | POA: Diagnosis not present

## 2020-04-14 DIAGNOSIS — Z741 Need for assistance with personal care: Secondary | ICD-10-CM | POA: Diagnosis not present

## 2020-04-18 DIAGNOSIS — M6281 Muscle weakness (generalized): Secondary | ICD-10-CM | POA: Diagnosis not present

## 2020-04-18 DIAGNOSIS — Z741 Need for assistance with personal care: Secondary | ICD-10-CM | POA: Diagnosis not present

## 2020-04-18 DIAGNOSIS — F0280 Dementia in other diseases classified elsewhere without behavioral disturbance: Secondary | ICD-10-CM | POA: Diagnosis not present

## 2020-04-18 DIAGNOSIS — R4189 Other symptoms and signs involving cognitive functions and awareness: Secondary | ICD-10-CM | POA: Diagnosis not present

## 2020-04-18 DIAGNOSIS — K3580 Unspecified acute appendicitis: Secondary | ICD-10-CM | POA: Diagnosis not present

## 2020-04-18 DIAGNOSIS — R2681 Unsteadiness on feet: Secondary | ICD-10-CM | POA: Diagnosis not present

## 2020-04-18 DIAGNOSIS — R278 Other lack of coordination: Secondary | ICD-10-CM | POA: Diagnosis not present

## 2020-04-18 DIAGNOSIS — G20 Parkinson's disease: Secondary | ICD-10-CM | POA: Diagnosis not present

## 2020-04-19 DIAGNOSIS — Z741 Need for assistance with personal care: Secondary | ICD-10-CM | POA: Diagnosis not present

## 2020-04-19 DIAGNOSIS — R278 Other lack of coordination: Secondary | ICD-10-CM | POA: Diagnosis not present

## 2020-04-19 DIAGNOSIS — R4189 Other symptoms and signs involving cognitive functions and awareness: Secondary | ICD-10-CM | POA: Diagnosis not present

## 2020-04-19 DIAGNOSIS — F0280 Dementia in other diseases classified elsewhere without behavioral disturbance: Secondary | ICD-10-CM | POA: Diagnosis not present

## 2020-04-19 DIAGNOSIS — G20 Parkinson's disease: Secondary | ICD-10-CM | POA: Diagnosis not present

## 2020-04-19 DIAGNOSIS — R2681 Unsteadiness on feet: Secondary | ICD-10-CM | POA: Diagnosis not present

## 2020-04-20 DIAGNOSIS — R278 Other lack of coordination: Secondary | ICD-10-CM | POA: Diagnosis not present

## 2020-04-20 DIAGNOSIS — R4189 Other symptoms and signs involving cognitive functions and awareness: Secondary | ICD-10-CM | POA: Diagnosis not present

## 2020-04-20 DIAGNOSIS — F0280 Dementia in other diseases classified elsewhere without behavioral disturbance: Secondary | ICD-10-CM | POA: Diagnosis not present

## 2020-04-20 DIAGNOSIS — G20 Parkinson's disease: Secondary | ICD-10-CM | POA: Diagnosis not present

## 2020-04-20 DIAGNOSIS — Z741 Need for assistance with personal care: Secondary | ICD-10-CM | POA: Diagnosis not present

## 2020-04-20 DIAGNOSIS — R2681 Unsteadiness on feet: Secondary | ICD-10-CM | POA: Diagnosis not present

## 2020-04-21 DIAGNOSIS — G20 Parkinson's disease: Secondary | ICD-10-CM | POA: Diagnosis not present

## 2020-04-21 DIAGNOSIS — F0280 Dementia in other diseases classified elsewhere without behavioral disturbance: Secondary | ICD-10-CM | POA: Diagnosis not present

## 2020-04-21 DIAGNOSIS — Z741 Need for assistance with personal care: Secondary | ICD-10-CM | POA: Diagnosis not present

## 2020-04-21 DIAGNOSIS — R278 Other lack of coordination: Secondary | ICD-10-CM | POA: Diagnosis not present

## 2020-04-21 DIAGNOSIS — R2681 Unsteadiness on feet: Secondary | ICD-10-CM | POA: Diagnosis not present

## 2020-04-21 DIAGNOSIS — R4189 Other symptoms and signs involving cognitive functions and awareness: Secondary | ICD-10-CM | POA: Diagnosis not present

## 2020-04-22 DIAGNOSIS — G20 Parkinson's disease: Secondary | ICD-10-CM | POA: Diagnosis not present

## 2020-04-22 DIAGNOSIS — R4189 Other symptoms and signs involving cognitive functions and awareness: Secondary | ICD-10-CM | POA: Diagnosis not present

## 2020-04-22 DIAGNOSIS — Z741 Need for assistance with personal care: Secondary | ICD-10-CM | POA: Diagnosis not present

## 2020-04-22 DIAGNOSIS — F0280 Dementia in other diseases classified elsewhere without behavioral disturbance: Secondary | ICD-10-CM | POA: Diagnosis not present

## 2020-04-22 DIAGNOSIS — R2681 Unsteadiness on feet: Secondary | ICD-10-CM | POA: Diagnosis not present

## 2020-04-22 DIAGNOSIS — R278 Other lack of coordination: Secondary | ICD-10-CM | POA: Diagnosis not present

## 2020-04-24 DIAGNOSIS — W19XXXA Unspecified fall, initial encounter: Secondary | ICD-10-CM | POA: Diagnosis not present

## 2020-04-24 DIAGNOSIS — M79621 Pain in right upper arm: Secondary | ICD-10-CM | POA: Diagnosis not present

## 2020-04-24 DIAGNOSIS — M25511 Pain in right shoulder: Secondary | ICD-10-CM | POA: Diagnosis not present

## 2020-04-24 DIAGNOSIS — M25531 Pain in right wrist: Secondary | ICD-10-CM | POA: Diagnosis not present

## 2020-04-24 DIAGNOSIS — M79631 Pain in right forearm: Secondary | ICD-10-CM | POA: Diagnosis not present

## 2020-04-24 DIAGNOSIS — M25521 Pain in right elbow: Secondary | ICD-10-CM | POA: Diagnosis not present

## 2020-04-25 DIAGNOSIS — S42211A Unspecified displaced fracture of surgical neck of right humerus, initial encounter for closed fracture: Secondary | ICD-10-CM | POA: Diagnosis not present

## 2020-04-25 DIAGNOSIS — S42201A Unspecified fracture of upper end of right humerus, initial encounter for closed fracture: Secondary | ICD-10-CM | POA: Diagnosis not present

## 2020-04-26 DIAGNOSIS — F0280 Dementia in other diseases classified elsewhere without behavioral disturbance: Secondary | ICD-10-CM | POA: Diagnosis not present

## 2020-04-26 DIAGNOSIS — R4189 Other symptoms and signs involving cognitive functions and awareness: Secondary | ICD-10-CM | POA: Diagnosis not present

## 2020-04-26 DIAGNOSIS — R2681 Unsteadiness on feet: Secondary | ICD-10-CM | POA: Diagnosis not present

## 2020-04-26 DIAGNOSIS — Z741 Need for assistance with personal care: Secondary | ICD-10-CM | POA: Diagnosis not present

## 2020-04-26 DIAGNOSIS — G20 Parkinson's disease: Secondary | ICD-10-CM | POA: Diagnosis not present

## 2020-04-26 DIAGNOSIS — R278 Other lack of coordination: Secondary | ICD-10-CM | POA: Diagnosis not present

## 2020-04-27 DIAGNOSIS — R2681 Unsteadiness on feet: Secondary | ICD-10-CM | POA: Diagnosis not present

## 2020-04-27 DIAGNOSIS — R4189 Other symptoms and signs involving cognitive functions and awareness: Secondary | ICD-10-CM | POA: Diagnosis not present

## 2020-04-27 DIAGNOSIS — G20 Parkinson's disease: Secondary | ICD-10-CM | POA: Diagnosis not present

## 2020-04-27 DIAGNOSIS — R278 Other lack of coordination: Secondary | ICD-10-CM | POA: Diagnosis not present

## 2020-04-27 DIAGNOSIS — Z741 Need for assistance with personal care: Secondary | ICD-10-CM | POA: Diagnosis not present

## 2020-04-27 DIAGNOSIS — F0280 Dementia in other diseases classified elsewhere without behavioral disturbance: Secondary | ICD-10-CM | POA: Diagnosis not present

## 2020-04-28 DIAGNOSIS — R2681 Unsteadiness on feet: Secondary | ICD-10-CM | POA: Diagnosis not present

## 2020-04-28 DIAGNOSIS — G20 Parkinson's disease: Secondary | ICD-10-CM | POA: Diagnosis not present

## 2020-04-28 DIAGNOSIS — Z741 Need for assistance with personal care: Secondary | ICD-10-CM | POA: Diagnosis not present

## 2020-04-28 DIAGNOSIS — F0280 Dementia in other diseases classified elsewhere without behavioral disturbance: Secondary | ICD-10-CM | POA: Diagnosis not present

## 2020-04-28 DIAGNOSIS — R278 Other lack of coordination: Secondary | ICD-10-CM | POA: Diagnosis not present

## 2020-04-28 DIAGNOSIS — R4189 Other symptoms and signs involving cognitive functions and awareness: Secondary | ICD-10-CM | POA: Diagnosis not present

## 2020-05-03 DIAGNOSIS — R4189 Other symptoms and signs involving cognitive functions and awareness: Secondary | ICD-10-CM | POA: Diagnosis not present

## 2020-05-03 DIAGNOSIS — R278 Other lack of coordination: Secondary | ICD-10-CM | POA: Diagnosis not present

## 2020-05-03 DIAGNOSIS — R2681 Unsteadiness on feet: Secondary | ICD-10-CM | POA: Diagnosis not present

## 2020-05-03 DIAGNOSIS — G20 Parkinson's disease: Secondary | ICD-10-CM | POA: Diagnosis not present

## 2020-05-03 DIAGNOSIS — F0280 Dementia in other diseases classified elsewhere without behavioral disturbance: Secondary | ICD-10-CM | POA: Diagnosis not present

## 2020-05-03 DIAGNOSIS — Z741 Need for assistance with personal care: Secondary | ICD-10-CM | POA: Diagnosis not present

## 2020-05-04 DIAGNOSIS — R278 Other lack of coordination: Secondary | ICD-10-CM | POA: Diagnosis not present

## 2020-05-04 DIAGNOSIS — R4189 Other symptoms and signs involving cognitive functions and awareness: Secondary | ICD-10-CM | POA: Diagnosis not present

## 2020-05-04 DIAGNOSIS — F0280 Dementia in other diseases classified elsewhere without behavioral disturbance: Secondary | ICD-10-CM | POA: Diagnosis not present

## 2020-05-04 DIAGNOSIS — Z741 Need for assistance with personal care: Secondary | ICD-10-CM | POA: Diagnosis not present

## 2020-05-04 DIAGNOSIS — G20 Parkinson's disease: Secondary | ICD-10-CM | POA: Diagnosis not present

## 2020-05-04 DIAGNOSIS — R2681 Unsteadiness on feet: Secondary | ICD-10-CM | POA: Diagnosis not present

## 2020-05-05 DIAGNOSIS — R2681 Unsteadiness on feet: Secondary | ICD-10-CM | POA: Diagnosis not present

## 2020-05-05 DIAGNOSIS — G20 Parkinson's disease: Secondary | ICD-10-CM | POA: Diagnosis not present

## 2020-05-05 DIAGNOSIS — R278 Other lack of coordination: Secondary | ICD-10-CM | POA: Diagnosis not present

## 2020-05-05 DIAGNOSIS — Z741 Need for assistance with personal care: Secondary | ICD-10-CM | POA: Diagnosis not present

## 2020-05-05 DIAGNOSIS — F0280 Dementia in other diseases classified elsewhere without behavioral disturbance: Secondary | ICD-10-CM | POA: Diagnosis not present

## 2020-05-05 DIAGNOSIS — R4189 Other symptoms and signs involving cognitive functions and awareness: Secondary | ICD-10-CM | POA: Diagnosis not present

## 2020-05-09 DIAGNOSIS — F0280 Dementia in other diseases classified elsewhere without behavioral disturbance: Secondary | ICD-10-CM | POA: Diagnosis not present

## 2020-05-09 DIAGNOSIS — Z741 Need for assistance with personal care: Secondary | ICD-10-CM | POA: Diagnosis not present

## 2020-05-09 DIAGNOSIS — R278 Other lack of coordination: Secondary | ICD-10-CM | POA: Diagnosis not present

## 2020-05-09 DIAGNOSIS — G20 Parkinson's disease: Secondary | ICD-10-CM | POA: Diagnosis not present

## 2020-05-09 DIAGNOSIS — R2681 Unsteadiness on feet: Secondary | ICD-10-CM | POA: Diagnosis not present

## 2020-05-09 DIAGNOSIS — R4189 Other symptoms and signs involving cognitive functions and awareness: Secondary | ICD-10-CM | POA: Diagnosis not present

## 2020-05-10 DIAGNOSIS — G20 Parkinson's disease: Secondary | ICD-10-CM | POA: Diagnosis not present

## 2020-05-10 DIAGNOSIS — Z741 Need for assistance with personal care: Secondary | ICD-10-CM | POA: Diagnosis not present

## 2020-05-10 DIAGNOSIS — R4189 Other symptoms and signs involving cognitive functions and awareness: Secondary | ICD-10-CM | POA: Diagnosis not present

## 2020-05-10 DIAGNOSIS — F0280 Dementia in other diseases classified elsewhere without behavioral disturbance: Secondary | ICD-10-CM | POA: Diagnosis not present

## 2020-05-10 DIAGNOSIS — R2681 Unsteadiness on feet: Secondary | ICD-10-CM | POA: Diagnosis not present

## 2020-05-10 DIAGNOSIS — R278 Other lack of coordination: Secondary | ICD-10-CM | POA: Diagnosis not present

## 2020-05-11 DIAGNOSIS — F0280 Dementia in other diseases classified elsewhere without behavioral disturbance: Secondary | ICD-10-CM | POA: Diagnosis not present

## 2020-05-11 DIAGNOSIS — G20 Parkinson's disease: Secondary | ICD-10-CM | POA: Diagnosis not present

## 2020-05-11 DIAGNOSIS — R2681 Unsteadiness on feet: Secondary | ICD-10-CM | POA: Diagnosis not present

## 2020-05-11 DIAGNOSIS — Z741 Need for assistance with personal care: Secondary | ICD-10-CM | POA: Diagnosis not present

## 2020-05-11 DIAGNOSIS — R4189 Other symptoms and signs involving cognitive functions and awareness: Secondary | ICD-10-CM | POA: Diagnosis not present

## 2020-05-11 DIAGNOSIS — R278 Other lack of coordination: Secondary | ICD-10-CM | POA: Diagnosis not present

## 2020-05-12 DIAGNOSIS — Z741 Need for assistance with personal care: Secondary | ICD-10-CM | POA: Diagnosis not present

## 2020-05-12 DIAGNOSIS — R4189 Other symptoms and signs involving cognitive functions and awareness: Secondary | ICD-10-CM | POA: Diagnosis not present

## 2020-05-12 DIAGNOSIS — F0280 Dementia in other diseases classified elsewhere without behavioral disturbance: Secondary | ICD-10-CM | POA: Diagnosis not present

## 2020-05-12 DIAGNOSIS — R2681 Unsteadiness on feet: Secondary | ICD-10-CM | POA: Diagnosis not present

## 2020-05-12 DIAGNOSIS — R278 Other lack of coordination: Secondary | ICD-10-CM | POA: Diagnosis not present

## 2020-05-12 DIAGNOSIS — G20 Parkinson's disease: Secondary | ICD-10-CM | POA: Diagnosis not present

## 2020-05-13 DIAGNOSIS — R278 Other lack of coordination: Secondary | ICD-10-CM | POA: Diagnosis not present

## 2020-05-13 DIAGNOSIS — G20 Parkinson's disease: Secondary | ICD-10-CM | POA: Diagnosis not present

## 2020-05-13 DIAGNOSIS — R4189 Other symptoms and signs involving cognitive functions and awareness: Secondary | ICD-10-CM | POA: Diagnosis not present

## 2020-05-13 DIAGNOSIS — R2681 Unsteadiness on feet: Secondary | ICD-10-CM | POA: Diagnosis not present

## 2020-05-13 DIAGNOSIS — F0280 Dementia in other diseases classified elsewhere without behavioral disturbance: Secondary | ICD-10-CM | POA: Diagnosis not present

## 2020-05-13 DIAGNOSIS — Z741 Need for assistance with personal care: Secondary | ICD-10-CM | POA: Diagnosis not present

## 2020-05-16 DIAGNOSIS — S42201A Unspecified fracture of upper end of right humerus, initial encounter for closed fracture: Secondary | ICD-10-CM | POA: Diagnosis not present

## 2020-05-17 DIAGNOSIS — R278 Other lack of coordination: Secondary | ICD-10-CM | POA: Diagnosis not present

## 2020-05-17 DIAGNOSIS — Z741 Need for assistance with personal care: Secondary | ICD-10-CM | POA: Diagnosis not present

## 2020-05-17 DIAGNOSIS — K3580 Unspecified acute appendicitis: Secondary | ICD-10-CM | POA: Diagnosis not present

## 2020-05-17 DIAGNOSIS — R2681 Unsteadiness on feet: Secondary | ICD-10-CM | POA: Diagnosis not present

## 2020-05-17 DIAGNOSIS — M6281 Muscle weakness (generalized): Secondary | ICD-10-CM | POA: Diagnosis not present

## 2020-05-17 DIAGNOSIS — G20 Parkinson's disease: Secondary | ICD-10-CM | POA: Diagnosis not present

## 2020-05-17 DIAGNOSIS — F0280 Dementia in other diseases classified elsewhere without behavioral disturbance: Secondary | ICD-10-CM | POA: Diagnosis not present

## 2020-05-17 DIAGNOSIS — R4189 Other symptoms and signs involving cognitive functions and awareness: Secondary | ICD-10-CM | POA: Diagnosis not present

## 2020-05-18 DIAGNOSIS — R4189 Other symptoms and signs involving cognitive functions and awareness: Secondary | ICD-10-CM | POA: Diagnosis not present

## 2020-05-18 DIAGNOSIS — F0280 Dementia in other diseases classified elsewhere without behavioral disturbance: Secondary | ICD-10-CM | POA: Diagnosis not present

## 2020-05-18 DIAGNOSIS — G20 Parkinson's disease: Secondary | ICD-10-CM | POA: Diagnosis not present

## 2020-05-18 DIAGNOSIS — Z741 Need for assistance with personal care: Secondary | ICD-10-CM | POA: Diagnosis not present

## 2020-05-18 DIAGNOSIS — R278 Other lack of coordination: Secondary | ICD-10-CM | POA: Diagnosis not present

## 2020-05-18 DIAGNOSIS — R2681 Unsteadiness on feet: Secondary | ICD-10-CM | POA: Diagnosis not present

## 2020-05-19 DIAGNOSIS — G20 Parkinson's disease: Secondary | ICD-10-CM | POA: Diagnosis not present

## 2020-05-19 DIAGNOSIS — Z741 Need for assistance with personal care: Secondary | ICD-10-CM | POA: Diagnosis not present

## 2020-05-19 DIAGNOSIS — R278 Other lack of coordination: Secondary | ICD-10-CM | POA: Diagnosis not present

## 2020-05-19 DIAGNOSIS — F0280 Dementia in other diseases classified elsewhere without behavioral disturbance: Secondary | ICD-10-CM | POA: Diagnosis not present

## 2020-05-19 DIAGNOSIS — R4189 Other symptoms and signs involving cognitive functions and awareness: Secondary | ICD-10-CM | POA: Diagnosis not present

## 2020-05-19 DIAGNOSIS — R2681 Unsteadiness on feet: Secondary | ICD-10-CM | POA: Diagnosis not present

## 2020-05-24 DIAGNOSIS — G20 Parkinson's disease: Secondary | ICD-10-CM | POA: Diagnosis not present

## 2020-05-24 DIAGNOSIS — R2681 Unsteadiness on feet: Secondary | ICD-10-CM | POA: Diagnosis not present

## 2020-05-24 DIAGNOSIS — R4189 Other symptoms and signs involving cognitive functions and awareness: Secondary | ICD-10-CM | POA: Diagnosis not present

## 2020-05-24 DIAGNOSIS — F0280 Dementia in other diseases classified elsewhere without behavioral disturbance: Secondary | ICD-10-CM | POA: Diagnosis not present

## 2020-05-24 DIAGNOSIS — R278 Other lack of coordination: Secondary | ICD-10-CM | POA: Diagnosis not present

## 2020-05-24 DIAGNOSIS — Z741 Need for assistance with personal care: Secondary | ICD-10-CM | POA: Diagnosis not present

## 2020-05-25 DIAGNOSIS — F0280 Dementia in other diseases classified elsewhere without behavioral disturbance: Secondary | ICD-10-CM | POA: Diagnosis not present

## 2020-05-25 DIAGNOSIS — M1990 Unspecified osteoarthritis, unspecified site: Secondary | ICD-10-CM | POA: Diagnosis not present

## 2020-05-25 DIAGNOSIS — F22 Delusional disorders: Secondary | ICD-10-CM | POA: Diagnosis not present

## 2020-05-25 DIAGNOSIS — Z741 Need for assistance with personal care: Secondary | ICD-10-CM | POA: Diagnosis not present

## 2020-05-25 DIAGNOSIS — S42201A Unspecified fracture of upper end of right humerus, initial encounter for closed fracture: Secondary | ICD-10-CM | POA: Diagnosis not present

## 2020-05-25 DIAGNOSIS — G20 Parkinson's disease: Secondary | ICD-10-CM | POA: Diagnosis not present

## 2020-05-25 DIAGNOSIS — R2681 Unsteadiness on feet: Secondary | ICD-10-CM | POA: Diagnosis not present

## 2020-05-25 DIAGNOSIS — R4189 Other symptoms and signs involving cognitive functions and awareness: Secondary | ICD-10-CM | POA: Diagnosis not present

## 2020-05-25 DIAGNOSIS — E43 Unspecified severe protein-calorie malnutrition: Secondary | ICD-10-CM | POA: Diagnosis not present

## 2020-05-25 DIAGNOSIS — R278 Other lack of coordination: Secondary | ICD-10-CM | POA: Diagnosis not present

## 2020-05-26 DIAGNOSIS — F0280 Dementia in other diseases classified elsewhere without behavioral disturbance: Secondary | ICD-10-CM | POA: Diagnosis not present

## 2020-05-26 DIAGNOSIS — R278 Other lack of coordination: Secondary | ICD-10-CM | POA: Diagnosis not present

## 2020-05-26 DIAGNOSIS — G20 Parkinson's disease: Secondary | ICD-10-CM | POA: Diagnosis not present

## 2020-05-26 DIAGNOSIS — Z741 Need for assistance with personal care: Secondary | ICD-10-CM | POA: Diagnosis not present

## 2020-05-26 DIAGNOSIS — R2681 Unsteadiness on feet: Secondary | ICD-10-CM | POA: Diagnosis not present

## 2020-05-26 DIAGNOSIS — R4189 Other symptoms and signs involving cognitive functions and awareness: Secondary | ICD-10-CM | POA: Diagnosis not present

## 2020-05-27 DIAGNOSIS — R278 Other lack of coordination: Secondary | ICD-10-CM | POA: Diagnosis not present

## 2020-05-27 DIAGNOSIS — R2681 Unsteadiness on feet: Secondary | ICD-10-CM | POA: Diagnosis not present

## 2020-05-27 DIAGNOSIS — R4189 Other symptoms and signs involving cognitive functions and awareness: Secondary | ICD-10-CM | POA: Diagnosis not present

## 2020-05-27 DIAGNOSIS — G20 Parkinson's disease: Secondary | ICD-10-CM | POA: Diagnosis not present

## 2020-05-27 DIAGNOSIS — F0280 Dementia in other diseases classified elsewhere without behavioral disturbance: Secondary | ICD-10-CM | POA: Diagnosis not present

## 2020-05-27 DIAGNOSIS — Z741 Need for assistance with personal care: Secondary | ICD-10-CM | POA: Diagnosis not present

## 2020-06-01 DIAGNOSIS — G20 Parkinson's disease: Secondary | ICD-10-CM | POA: Diagnosis not present

## 2020-06-01 DIAGNOSIS — F0280 Dementia in other diseases classified elsewhere without behavioral disturbance: Secondary | ICD-10-CM | POA: Diagnosis not present

## 2020-06-01 DIAGNOSIS — R4189 Other symptoms and signs involving cognitive functions and awareness: Secondary | ICD-10-CM | POA: Diagnosis not present

## 2020-06-01 DIAGNOSIS — R2681 Unsteadiness on feet: Secondary | ICD-10-CM | POA: Diagnosis not present

## 2020-06-01 DIAGNOSIS — R278 Other lack of coordination: Secondary | ICD-10-CM | POA: Diagnosis not present

## 2020-06-01 DIAGNOSIS — Z741 Need for assistance with personal care: Secondary | ICD-10-CM | POA: Diagnosis not present

## 2020-06-02 DIAGNOSIS — G20 Parkinson's disease: Secondary | ICD-10-CM | POA: Diagnosis not present

## 2020-06-02 DIAGNOSIS — R2681 Unsteadiness on feet: Secondary | ICD-10-CM | POA: Diagnosis not present

## 2020-06-02 DIAGNOSIS — F0280 Dementia in other diseases classified elsewhere without behavioral disturbance: Secondary | ICD-10-CM | POA: Diagnosis not present

## 2020-06-02 DIAGNOSIS — S42201A Unspecified fracture of upper end of right humerus, initial encounter for closed fracture: Secondary | ICD-10-CM | POA: Diagnosis not present

## 2020-06-02 DIAGNOSIS — R4189 Other symptoms and signs involving cognitive functions and awareness: Secondary | ICD-10-CM | POA: Diagnosis not present

## 2020-06-02 DIAGNOSIS — Z741 Need for assistance with personal care: Secondary | ICD-10-CM | POA: Diagnosis not present

## 2020-06-02 DIAGNOSIS — R278 Other lack of coordination: Secondary | ICD-10-CM | POA: Diagnosis not present

## 2020-06-03 DIAGNOSIS — F0280 Dementia in other diseases classified elsewhere without behavioral disturbance: Secondary | ICD-10-CM | POA: Diagnosis not present

## 2020-06-03 DIAGNOSIS — R278 Other lack of coordination: Secondary | ICD-10-CM | POA: Diagnosis not present

## 2020-06-03 DIAGNOSIS — R2681 Unsteadiness on feet: Secondary | ICD-10-CM | POA: Diagnosis not present

## 2020-06-03 DIAGNOSIS — G20 Parkinson's disease: Secondary | ICD-10-CM | POA: Diagnosis not present

## 2020-06-03 DIAGNOSIS — Z741 Need for assistance with personal care: Secondary | ICD-10-CM | POA: Diagnosis not present

## 2020-06-03 DIAGNOSIS — R4189 Other symptoms and signs involving cognitive functions and awareness: Secondary | ICD-10-CM | POA: Diagnosis not present

## 2020-06-07 DIAGNOSIS — G20 Parkinson's disease: Secondary | ICD-10-CM | POA: Diagnosis not present

## 2020-06-07 DIAGNOSIS — Z741 Need for assistance with personal care: Secondary | ICD-10-CM | POA: Diagnosis not present

## 2020-06-07 DIAGNOSIS — R2681 Unsteadiness on feet: Secondary | ICD-10-CM | POA: Diagnosis not present

## 2020-06-07 DIAGNOSIS — R4189 Other symptoms and signs involving cognitive functions and awareness: Secondary | ICD-10-CM | POA: Diagnosis not present

## 2020-06-07 DIAGNOSIS — F0280 Dementia in other diseases classified elsewhere without behavioral disturbance: Secondary | ICD-10-CM | POA: Diagnosis not present

## 2020-06-07 DIAGNOSIS — R278 Other lack of coordination: Secondary | ICD-10-CM | POA: Diagnosis not present

## 2020-06-08 DIAGNOSIS — G20 Parkinson's disease: Secondary | ICD-10-CM | POA: Diagnosis not present

## 2020-06-08 DIAGNOSIS — F0280 Dementia in other diseases classified elsewhere without behavioral disturbance: Secondary | ICD-10-CM | POA: Diagnosis not present

## 2020-06-08 DIAGNOSIS — Z741 Need for assistance with personal care: Secondary | ICD-10-CM | POA: Diagnosis not present

## 2020-06-08 DIAGNOSIS — R2681 Unsteadiness on feet: Secondary | ICD-10-CM | POA: Diagnosis not present

## 2020-06-08 DIAGNOSIS — R4189 Other symptoms and signs involving cognitive functions and awareness: Secondary | ICD-10-CM | POA: Diagnosis not present

## 2020-06-08 DIAGNOSIS — R278 Other lack of coordination: Secondary | ICD-10-CM | POA: Diagnosis not present

## 2020-06-13 DIAGNOSIS — R278 Other lack of coordination: Secondary | ICD-10-CM | POA: Diagnosis not present

## 2020-06-13 DIAGNOSIS — Z741 Need for assistance with personal care: Secondary | ICD-10-CM | POA: Diagnosis not present

## 2020-06-13 DIAGNOSIS — G20 Parkinson's disease: Secondary | ICD-10-CM | POA: Diagnosis not present

## 2020-06-13 DIAGNOSIS — R2681 Unsteadiness on feet: Secondary | ICD-10-CM | POA: Diagnosis not present

## 2020-06-13 DIAGNOSIS — F0280 Dementia in other diseases classified elsewhere without behavioral disturbance: Secondary | ICD-10-CM | POA: Diagnosis not present

## 2020-06-13 DIAGNOSIS — R4189 Other symptoms and signs involving cognitive functions and awareness: Secondary | ICD-10-CM | POA: Diagnosis not present

## 2020-06-15 DIAGNOSIS — R2681 Unsteadiness on feet: Secondary | ICD-10-CM | POA: Diagnosis not present

## 2020-06-15 DIAGNOSIS — R4189 Other symptoms and signs involving cognitive functions and awareness: Secondary | ICD-10-CM | POA: Diagnosis not present

## 2020-06-15 DIAGNOSIS — F0280 Dementia in other diseases classified elsewhere without behavioral disturbance: Secondary | ICD-10-CM | POA: Diagnosis not present

## 2020-06-15 DIAGNOSIS — M6281 Muscle weakness (generalized): Secondary | ICD-10-CM | POA: Diagnosis not present

## 2020-06-15 DIAGNOSIS — R278 Other lack of coordination: Secondary | ICD-10-CM | POA: Diagnosis not present

## 2020-06-15 DIAGNOSIS — Z741 Need for assistance with personal care: Secondary | ICD-10-CM | POA: Diagnosis not present

## 2020-06-15 DIAGNOSIS — K3580 Unspecified acute appendicitis: Secondary | ICD-10-CM | POA: Diagnosis not present

## 2020-07-14 ENCOUNTER — Emergency Department
Admission: EM | Admit: 2020-07-14 | Discharge: 2020-07-14 | Disposition: A | Discharge status: Home / Self Care | Payer: Medicare Other | Attending: Emergency Medicine | Admitting: Emergency Medicine

## 2020-07-14 ENCOUNTER — Emergency Department: Payer: Medicare Other

## 2020-07-14 ENCOUNTER — Other Ambulatory Visit: Payer: Self-pay

## 2020-07-14 DIAGNOSIS — G20 Parkinson's disease: Secondary | ICD-10-CM | POA: Diagnosis not present

## 2020-07-14 DIAGNOSIS — S0990XA Unspecified injury of head, initial encounter: Secondary | ICD-10-CM | POA: Diagnosis not present

## 2020-07-14 DIAGNOSIS — R5381 Other malaise: Secondary | ICD-10-CM | POA: Diagnosis not present

## 2020-07-14 DIAGNOSIS — Z79899 Other long term (current) drug therapy: Secondary | ICD-10-CM | POA: Insufficient documentation

## 2020-07-14 DIAGNOSIS — F0390 Unspecified dementia without behavioral disturbance: Secondary | ICD-10-CM | POA: Diagnosis not present

## 2020-07-14 DIAGNOSIS — M2550 Pain in unspecified joint: Secondary | ICD-10-CM | POA: Diagnosis not present

## 2020-07-14 DIAGNOSIS — Z7401 Bed confinement status: Secondary | ICD-10-CM | POA: Diagnosis not present

## 2020-07-14 DIAGNOSIS — I10 Essential (primary) hypertension: Secondary | ICD-10-CM | POA: Diagnosis not present

## 2020-07-14 DIAGNOSIS — M25562 Pain in left knee: Secondary | ICD-10-CM | POA: Diagnosis not present

## 2020-07-14 DIAGNOSIS — W19XXXA Unspecified fall, initial encounter: Secondary | ICD-10-CM | POA: Insufficient documentation

## 2020-07-14 DIAGNOSIS — G319 Degenerative disease of nervous system, unspecified: Secondary | ICD-10-CM | POA: Diagnosis not present

## 2020-07-14 DIAGNOSIS — I672 Cerebral atherosclerosis: Secondary | ICD-10-CM | POA: Diagnosis not present

## 2020-07-14 DIAGNOSIS — R519 Headache, unspecified: Secondary | ICD-10-CM | POA: Diagnosis not present

## 2020-07-14 DIAGNOSIS — R52 Pain, unspecified: Secondary | ICD-10-CM | POA: Diagnosis not present

## 2020-07-14 NOTE — ED Notes (Signed)
Unit secretary setting up transfer back to [facility]

## 2020-07-14 NOTE — ED Provider Notes (Signed)
[facility] Emergency Department Provider Note  ____________________________________________  Time seen: Approximately 6:39 PM  I have reviewed the triage vital signs and the nursing notes.   HISTORY  Chief Complaint Fall    HPI Cheryl Cummings is a 79 y.o. female who presents the emergency department for evaluation of head injury.  Patient reportedly had a mechanical fall striking the right temporal region of her head.  No reported loss of consciousness.  Patient been sent for evaluation.  She does have a history of Parkinson's and dementia.  Patient gives multiple different scenarios as to how this injury occurred.  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  Patient does come from a long-term care facility.  She is alert to her self, location and reason for visit even if she does give multiple different  stories as to how this injury occurred.        Past Medical History:  Diagnosis Date  . Essential hypertension, benign   . GERD (gastroesophageal reflux disease)   . Glaucoma   . Parkinson disease [facility](HCC)     Patient Active Problem List   Diagnosis Date Noted  . Acute appendicitis 03/07/2020  . Dementia (HCC) 12/18/2018  . Parkinson disease (HCC) 03/14/2017  . Malnutrition of mild degree (HCC) 03/14/2017  . GERD (gastroesophageal reflux disease)   . Glaucoma   . Generalized osteoarthritis of multiple sites     Past Surgical History:  Procedure Laterality Date  . LAPAROSCOPIC APPENDECTOMY N/A 03/07/2020   Procedure: APPENDECTOMY LAPAROSCOPIC;  Surgeon: Henrene DodgePiscoya, Jose, MD;  Location: [facility] ORS;  Service: General;  Laterality: N/A;  . VAGINAL DELIVERY     twice    Prior to Admission medications   Medication Sig Start Date End Date Taking? Authorizing Provider  acetaminophen (TYLENOL) 325 MG tablet  Take 325-650 mg by mouth every 4 (four) hours as needed for mild pain or fever.    [provider]  alum & mag hydroxide-simeth (MINTOX) 200-200-20 MG/5ML suspension Take 30 mLs by mouth every 6 (six) hours as needed for indigestion or heartburn.    [provider]  bismuth subsalicylate (KAOPECTATE) 262 MG/15ML suspension Take 10 mLs by mouth every 6 (six) hours as needed for indigestion or diarrhea or loose stools.    [provider]  carbamide peroxide (DEBROX) 6.5 % OTIC solution Place 5 drops into both ears 2 (two) times daily as needed (wax buildup).    [provider]  carbidopa-levodopa (SINEMET IR) 25-100 MG tablet Take 1-2 tablets by mouth in the morning, at noon, in the evening, and at bedtime. 02/19/20   [provider]  cetirizine (ZYRTEC) 10 MG tablet Take 10 mg by mouth daily as needed for allergies.    [provider]  dextromethorphan-guaiFENesin (TUSSIN DM) 10-100 MG/5ML liquid Take 10 mLs by mouth every 4 (four) hours as needed for cough.    [provider]  diphenhydrAMINE (BENADRYL) 25 MG tablet Take 25 mg by mouth every 6 (six) hours as needed for itching or allergies.    [provider]  dorzolamide-timolol (COSOPT) 22.3-6.8 MG/ML ophthalmic solution Place 1 drop into both eyes 2 (two) times daily.    [provider]  magnesium hydroxide (MILK OF MAGNESIA) 400 MG/5ML suspension Take 30 mLs by mouth at bedtime as needed for mild constipation or moderate  constipation.    [provider]  Multiple Vitamins-Minerals (MULTIVITAMIN WITH MINERALS) tablet Take 1 tablet by mouth daily.    [provider]  nitroGLYCERIN (NITROSTAT) 0.4 MG SL tablet Place 0.4 mg under the tongue as needed. 01/30/20   [provider]  ondansetron (ZOFRAN) 4 MG tablet Take 4 mg by mouth every 8 (eight) hours as needed for nausea or vomiting.    [provider]  oxyCODONE (OXY IR/ROXICODONE) 5 MG  immediate release tablet Take 1 tablet (5 mg total) by mouth every 6 (six) hours as needed for severe pain or breakthrough pain. 03/09/20   Donovan Kail, PA-C  TRAVATAN Z 0.004 % SOLN ophthalmic solution Place 1 drop into both eyes at bedtime.    [provider]  Vitamin D, Ergocalciferol, (DRISDOL) 50000 units CAPS capsule Take 1 capsule (50,000 Units total) by mouth every 30 (thirty) days. Patient taking differently: Take 50,000 Units by mouth. Every 28 days 03/14/17   [name], MD    Allergies Nsaids, Diazepam, Ibuprofen, Meperidine, Mushroom extract complex, Other, and Shellfish allergy  No family history on file.  Social History Social History   Tobacco Use  . Smoking status: Never Smoker  . Smokeless tobacco: Never Used  Substance Use Topics  . Alcohol use: No  . Drug use: No     Review of Systems  Constitutional: No fever/chills positive for fall striking the right side of the head.  No reported loss of consciousness. Eyes: No visual changes. No discharge ENT: No upper respiratory complaints. Cardiovascular: no chest pain. Respiratory: no cough. No SOB. Gastrointestinal: No abdominal pain.  No nausea, no vomiting.  No diarrhea.  No constipation. Genitourinary: Negative for dysuria. No hematuria Musculoskeletal: Negative for musculoskeletal pain. Skin: Negative for rash, abrasions, lacerations, ecchymosis. Neurological: Negative for headaches, focal weakness or numbness.  10 System ROS otherwise negative.  ____________________________________________   PHYSICAL EXAM:  VITAL SIGNS: ED Triage Vitals  Enc Vitals Group     BP 07/14/20 1625 (!) 171/111     Pulse Rate 07/14/20 1622 83     Resp 07/14/20 1622 18     Temp 07/14/20 1622 98.6 F (37 C)     Temp Source 07/14/20 1622 Oral     SpO2 07/14/20 1622 100 %     Weight 07/14/20 1622 119 lb 14.9 oz (54.4 kg)     Height 07/14/20 1622 5\' 6"  (1.676 m)     Head Circumference --      Peak Flow  --      Pain Score 07/14/20 1622 5     Pain Loc --      Pain Edu? --      Excl. in GC? --      Constitutional: Alert and oriented to self, location.  Unsure of date.  Patient understands why she is in the emergency department, but is unsure of the exact events leading up to her arrival in the ED.. Well appearing and in no acute distress. Eyes: Conjunctivae are normal. PERRL. EOMI. Head: Small hematoma to the right temple region.  No other visible signs of trauma with abrasions, lacerations, ecchymosis.  Area does not appear to be tender to palpation.  No palpable abnormality or crepitus underlying hematoma.  No battle signs, raccoon eyes, serosanguineous fluid drainage from the ears or nares. ENT:      Ears:       Nose: No congestion/rhinnorhea.      Mouth/Throat: Mucous membranes are moist.  Neck:  No stridor.  No cervical spine tenderness to palpation  Cardiovascular: Normal rate, regular rhythm. Normal S1 and S2.  Good peripheral circulation. Respiratory: Normal respiratory effort without tachypnea or retractions. Lungs CTAB. Good air entry to the bases with no decreased or absent breath sounds. Musculoskeletal: Full range of motion to all extremities. No gross deformities appreciated. Neurologic:  Normal speech and language. No gross focal neurologic deficits are appreciated.  Cranial nerves II through XII grossly intact. Skin:  Skin is warm, dry and intact. No rash noted. Psychiatric: Mood and affect are normal. Speech and behavior are normal.  Patient with obvious dementia as she is unable to recall events leading up to her fall.  Patient is alert to herself, location.  She is not alert to date/time.  Patient was unable to provide consistent story regarding her recent fall.  However patient is able to have a logical conversation regarding this injury, my differential, our work-up.   ____________________________________________   LABS (all labs ordered are listed, but only abnormal  results are displayed)  Labs Reviewed - No data to display ____________________________________________  EKG   ____________________________________________  RADIOLOGY I personally viewed and evaluated these images as part of my medical decision making, as well as reviewing the written report by the radiologist.  ED Provider Interpretation: No acute traumatic findings on CT scan of the head  CT Head Wo Contrast  Result Date: 07/14/2020 CLINICAL DATA:  Unwitnessed fall. Head trauma with hematoma to the right temple. EXAM: CT HEAD WITHOUT CONTRAST TECHNIQUE: Contiguous axial images were obtained from the base of the skull through the vertex without intravenous contrast. COMPARISON:  CT head 12/21/2018.  MRI brain 03/01/2016 FINDINGS: Brain: Mild cerebral atrophy. No ventricular dilatation. Diffuse low attenuation change throughout the deep white matter likely representing small vessel ischemia. No mass-effect or midline shift. No abnormal extra-axial fluid collections. Gray-white matter junctions are distinct. Basal cisterns are not effaced. No acute intracranial hemorrhage. Vascular: Moderate intracranial arterial vascular calcifications. Skull: The calvarium appears intact. No acute depressed skull fractures. Sinuses/Orbits: Paranasal sinuses and mastoid air cells are clear. Other: Small subcutaneous scalp hematoma over the right posterior frontal region. IMPRESSION: 1. No acute intracranial abnormalities. 2. Chronic atrophy and small vessel ischemic changes. Electronically Signed   By: Burman Nieves M.D.   On: 07/14/2020 17:05    ____________________________________________    PROCEDURES  Procedure(s) performed:    Procedures    Medications - No data to display   ____________________________________________   INITIAL IMPRESSION / ASSESSMENT AND PLAN / ED COURSE  Pertinent labs & imaging results that were available during my care of the patient were reviewed by me and  considered in my medical decision making (see chart for details).  Review of the [place] CSRS was performed in accordance of the [facility] prior to dispensing any controlled drugs.           Patient's diagnosis is consistent with fall, minor head injury.  Patient presented to emergency department after reported mechanical fall today.  There was no reported loss of consciousness.  Patient had multiple recounted events on how she fell, none of them logical.  She does have a history of Parkinson's and parkinsonian dementia.  Patient had a reassuring exam.  She was neurologically intact.  Mild hematoma to the right scalp.  No other reported injuries.  Full range of motion all extremities.  Patient was alert to herself, location and cause for her arrival in the ED.  She was confused as to the  story leading up to her injury however.  Patient was able to converse about my differential, our work-up and findings.  She was able to perform cranial nerve testing without difficulty.  At this time there was no traumatic findings on CT scan of the head.  No indication for further work-up as she has good range of motion to the remaining extremities, no other tenderness on exam and no other physical exam findings concerning for additional trauma..  Patient will be discharged back to long-term care facility at this time.  Patient is given ED precautions to return to the ED for any worsening or new symptoms.     ____________________________________________  FINAL CLINICAL IMPRESSION(S) / ED DIAGNOSES  Final diagnoses:  Fall, initial encounter  Minor head injury, initial encounter      NEW MEDICATIONS STARTED DURING THIS VISIT:  ED Discharge Orders    None          This chart was dictated using voice recognition software/Dragon. Despite best efforts to proofread, errors can occur which can change the meaning. Any change was purely unintentional.    Racheal Patches, PA-C 07/14/20 1906    Arnaldo Natal, MD 07/14/20 2322

## 2020-07-14 NOTE — ED Triage Notes (Addendum)
Pt arrived to ED via EMS with c/o unwitnessed mechanical fall. Pt states she hit her head. Pt denies blood thinner use.   Pt states she was in her office and tripped after going to pick up some paperwork. Pt denies any LOC. Pt is A&Ox4 and answering questions appropriately at this time.   Pt visualized to have hematoma on R temple at this time.   Pt states hx of parkinson's

## 2020-07-14 NOTE — ED Notes (Signed)
Pt reports rustling up cattle and fell today striking her head and landing on her right hand/arm.  Pt brought in via ems from her apartment.  Pt has pain to right side of head.  No loc  No vomiting.  Pt alert  Speech clear.  No swelling or deformity noted to right arm.

## 2020-07-14 NOTE — ED Triage Notes (Signed)
First RN Note: pt to ED via [facility] with c/o unwitnessed fall at [facility]. Per EMS pt with hx of parkinson's and dementia, per EMS pt with hematoma to R temple and L knee pain at this time.

## 2020-08-15 DIAGNOSIS — H6120 Impacted cerumen, unspecified ear: Secondary | ICD-10-CM | POA: Diagnosis not present

## 2020-09-21 DIAGNOSIS — Z741 Need for assistance with personal care: Secondary | ICD-10-CM | POA: Diagnosis not present

## 2020-09-21 DIAGNOSIS — R278 Other lack of coordination: Secondary | ICD-10-CM | POA: Diagnosis not present

## 2020-09-21 DIAGNOSIS — R2689 Other abnormalities of gait and mobility: Secondary | ICD-10-CM | POA: Diagnosis not present

## 2020-09-21 DIAGNOSIS — R2681 Unsteadiness on feet: Secondary | ICD-10-CM | POA: Diagnosis not present

## 2020-09-21 DIAGNOSIS — F0280 Dementia in other diseases classified elsewhere without behavioral disturbance: Secondary | ICD-10-CM | POA: Diagnosis not present

## 2020-09-21 DIAGNOSIS — W19XXXD Unspecified fall, subsequent encounter: Secondary | ICD-10-CM | POA: Diagnosis not present

## 2020-09-21 DIAGNOSIS — M6281 Muscle weakness (generalized): Secondary | ICD-10-CM | POA: Diagnosis not present

## 2020-09-21 DIAGNOSIS — R4189 Other symptoms and signs involving cognitive functions and awareness: Secondary | ICD-10-CM | POA: Diagnosis not present

## 2020-09-21 DIAGNOSIS — G20 Parkinson's disease: Secondary | ICD-10-CM | POA: Diagnosis not present

## 2020-09-23 DIAGNOSIS — F0280 Dementia in other diseases classified elsewhere without behavioral disturbance: Secondary | ICD-10-CM | POA: Diagnosis not present

## 2020-09-23 DIAGNOSIS — R2681 Unsteadiness on feet: Secondary | ICD-10-CM | POA: Diagnosis not present

## 2020-09-23 DIAGNOSIS — R2689 Other abnormalities of gait and mobility: Secondary | ICD-10-CM | POA: Diagnosis not present

## 2020-09-23 DIAGNOSIS — G20 Parkinson's disease: Secondary | ICD-10-CM | POA: Diagnosis not present

## 2020-09-23 DIAGNOSIS — W19XXXD Unspecified fall, subsequent encounter: Secondary | ICD-10-CM | POA: Diagnosis not present

## 2020-09-23 DIAGNOSIS — M6281 Muscle weakness (generalized): Secondary | ICD-10-CM | POA: Diagnosis not present

## 2020-09-24 DIAGNOSIS — M6281 Muscle weakness (generalized): Secondary | ICD-10-CM | POA: Diagnosis not present

## 2020-09-24 DIAGNOSIS — W19XXXD Unspecified fall, subsequent encounter: Secondary | ICD-10-CM | POA: Diagnosis not present

## 2020-09-24 DIAGNOSIS — R2681 Unsteadiness on feet: Secondary | ICD-10-CM | POA: Diagnosis not present

## 2020-09-24 DIAGNOSIS — G20 Parkinson's disease: Secondary | ICD-10-CM | POA: Diagnosis not present

## 2020-09-24 DIAGNOSIS — R2689 Other abnormalities of gait and mobility: Secondary | ICD-10-CM | POA: Diagnosis not present

## 2020-09-24 DIAGNOSIS — F0280 Dementia in other diseases classified elsewhere without behavioral disturbance: Secondary | ICD-10-CM | POA: Diagnosis not present

## 2020-09-25 DIAGNOSIS — W19XXXD Unspecified fall, subsequent encounter: Secondary | ICD-10-CM | POA: Diagnosis not present

## 2020-09-25 DIAGNOSIS — F0280 Dementia in other diseases classified elsewhere without behavioral disturbance: Secondary | ICD-10-CM | POA: Diagnosis not present

## 2020-09-25 DIAGNOSIS — R2681 Unsteadiness on feet: Secondary | ICD-10-CM | POA: Diagnosis not present

## 2020-09-25 DIAGNOSIS — R2689 Other abnormalities of gait and mobility: Secondary | ICD-10-CM | POA: Diagnosis not present

## 2020-09-25 DIAGNOSIS — G20 Parkinson's disease: Secondary | ICD-10-CM | POA: Diagnosis not present

## 2020-09-25 DIAGNOSIS — M6281 Muscle weakness (generalized): Secondary | ICD-10-CM | POA: Diagnosis not present

## 2020-09-26 DIAGNOSIS — R2689 Other abnormalities of gait and mobility: Secondary | ICD-10-CM | POA: Diagnosis not present

## 2020-09-26 DIAGNOSIS — R2681 Unsteadiness on feet: Secondary | ICD-10-CM | POA: Diagnosis not present

## 2020-09-26 DIAGNOSIS — M6281 Muscle weakness (generalized): Secondary | ICD-10-CM | POA: Diagnosis not present

## 2020-09-26 DIAGNOSIS — W19XXXD Unspecified fall, subsequent encounter: Secondary | ICD-10-CM | POA: Diagnosis not present

## 2020-09-26 DIAGNOSIS — G20 Parkinson's disease: Secondary | ICD-10-CM | POA: Diagnosis not present

## 2020-09-26 DIAGNOSIS — F0280 Dementia in other diseases classified elsewhere without behavioral disturbance: Secondary | ICD-10-CM | POA: Diagnosis not present

## 2020-09-27 DIAGNOSIS — R2681 Unsteadiness on feet: Secondary | ICD-10-CM | POA: Diagnosis not present

## 2020-09-27 DIAGNOSIS — F0280 Dementia in other diseases classified elsewhere without behavioral disturbance: Secondary | ICD-10-CM | POA: Diagnosis not present

## 2020-09-27 DIAGNOSIS — M6281 Muscle weakness (generalized): Secondary | ICD-10-CM | POA: Diagnosis not present

## 2020-09-27 DIAGNOSIS — W19XXXD Unspecified fall, subsequent encounter: Secondary | ICD-10-CM | POA: Diagnosis not present

## 2020-09-27 DIAGNOSIS — R2689 Other abnormalities of gait and mobility: Secondary | ICD-10-CM | POA: Diagnosis not present

## 2020-09-27 DIAGNOSIS — G20 Parkinson's disease: Secondary | ICD-10-CM | POA: Diagnosis not present

## 2020-09-28 DIAGNOSIS — F22 Delusional disorders: Secondary | ICD-10-CM | POA: Diagnosis not present

## 2020-09-28 DIAGNOSIS — K219 Gastro-esophageal reflux disease without esophagitis: Secondary | ICD-10-CM | POA: Diagnosis not present

## 2020-09-28 DIAGNOSIS — F0151 Vascular dementia with behavioral disturbance: Secondary | ICD-10-CM | POA: Diagnosis not present

## 2020-09-28 DIAGNOSIS — E43 Unspecified severe protein-calorie malnutrition: Secondary | ICD-10-CM | POA: Diagnosis not present

## 2020-09-28 DIAGNOSIS — G20 Parkinson's disease: Secondary | ICD-10-CM | POA: Diagnosis not present

## 2020-09-28 DIAGNOSIS — M1990 Unspecified osteoarthritis, unspecified site: Secondary | ICD-10-CM | POA: Diagnosis not present

## 2020-09-29 DIAGNOSIS — R2689 Other abnormalities of gait and mobility: Secondary | ICD-10-CM | POA: Diagnosis not present

## 2020-09-29 DIAGNOSIS — R2681 Unsteadiness on feet: Secondary | ICD-10-CM | POA: Diagnosis not present

## 2020-09-29 DIAGNOSIS — F0280 Dementia in other diseases classified elsewhere without behavioral disturbance: Secondary | ICD-10-CM | POA: Diagnosis not present

## 2020-09-29 DIAGNOSIS — W19XXXD Unspecified fall, subsequent encounter: Secondary | ICD-10-CM | POA: Diagnosis not present

## 2020-09-29 DIAGNOSIS — M6281 Muscle weakness (generalized): Secondary | ICD-10-CM | POA: Diagnosis not present

## 2020-09-29 DIAGNOSIS — G20 Parkinson's disease: Secondary | ICD-10-CM | POA: Diagnosis not present

## 2020-09-30 DIAGNOSIS — W19XXXD Unspecified fall, subsequent encounter: Secondary | ICD-10-CM | POA: Diagnosis not present

## 2020-09-30 DIAGNOSIS — R2689 Other abnormalities of gait and mobility: Secondary | ICD-10-CM | POA: Diagnosis not present

## 2020-09-30 DIAGNOSIS — M6281 Muscle weakness (generalized): Secondary | ICD-10-CM | POA: Diagnosis not present

## 2020-09-30 DIAGNOSIS — G20 Parkinson's disease: Secondary | ICD-10-CM | POA: Diagnosis not present

## 2020-09-30 DIAGNOSIS — F0280 Dementia in other diseases classified elsewhere without behavioral disturbance: Secondary | ICD-10-CM | POA: Diagnosis not present

## 2020-09-30 DIAGNOSIS — R2681 Unsteadiness on feet: Secondary | ICD-10-CM | POA: Diagnosis not present

## 2020-10-01 DIAGNOSIS — M6281 Muscle weakness (generalized): Secondary | ICD-10-CM | POA: Diagnosis not present

## 2020-10-01 DIAGNOSIS — W19XXXD Unspecified fall, subsequent encounter: Secondary | ICD-10-CM | POA: Diagnosis not present

## 2020-10-01 DIAGNOSIS — R2681 Unsteadiness on feet: Secondary | ICD-10-CM | POA: Diagnosis not present

## 2020-10-01 DIAGNOSIS — R2689 Other abnormalities of gait and mobility: Secondary | ICD-10-CM | POA: Diagnosis not present

## 2020-10-01 DIAGNOSIS — F0280 Dementia in other diseases classified elsewhere without behavioral disturbance: Secondary | ICD-10-CM | POA: Diagnosis not present

## 2020-10-01 DIAGNOSIS — G20 Parkinson's disease: Secondary | ICD-10-CM | POA: Diagnosis not present

## 2020-10-03 DIAGNOSIS — W19XXXD Unspecified fall, subsequent encounter: Secondary | ICD-10-CM | POA: Diagnosis not present

## 2020-10-03 DIAGNOSIS — H6120 Impacted cerumen, unspecified ear: Secondary | ICD-10-CM | POA: Diagnosis not present

## 2020-10-03 DIAGNOSIS — G20 Parkinson's disease: Secondary | ICD-10-CM | POA: Diagnosis not present

## 2020-10-03 DIAGNOSIS — R2689 Other abnormalities of gait and mobility: Secondary | ICD-10-CM | POA: Diagnosis not present

## 2020-10-03 DIAGNOSIS — M6281 Muscle weakness (generalized): Secondary | ICD-10-CM | POA: Diagnosis not present

## 2020-10-03 DIAGNOSIS — R2681 Unsteadiness on feet: Secondary | ICD-10-CM | POA: Diagnosis not present

## 2020-10-03 DIAGNOSIS — F0280 Dementia in other diseases classified elsewhere without behavioral disturbance: Secondary | ICD-10-CM | POA: Diagnosis not present

## 2020-10-04 DIAGNOSIS — F0280 Dementia in other diseases classified elsewhere without behavioral disturbance: Secondary | ICD-10-CM | POA: Diagnosis not present

## 2020-10-04 DIAGNOSIS — R2689 Other abnormalities of gait and mobility: Secondary | ICD-10-CM | POA: Diagnosis not present

## 2020-10-04 DIAGNOSIS — W19XXXD Unspecified fall, subsequent encounter: Secondary | ICD-10-CM | POA: Diagnosis not present

## 2020-10-04 DIAGNOSIS — M6281 Muscle weakness (generalized): Secondary | ICD-10-CM | POA: Diagnosis not present

## 2020-10-04 DIAGNOSIS — G20 Parkinson's disease: Secondary | ICD-10-CM | POA: Diagnosis not present

## 2020-10-04 DIAGNOSIS — R2681 Unsteadiness on feet: Secondary | ICD-10-CM | POA: Diagnosis not present

## 2020-10-05 DIAGNOSIS — W19XXXD Unspecified fall, subsequent encounter: Secondary | ICD-10-CM | POA: Diagnosis not present

## 2020-10-05 DIAGNOSIS — M6281 Muscle weakness (generalized): Secondary | ICD-10-CM | POA: Diagnosis not present

## 2020-10-05 DIAGNOSIS — F0280 Dementia in other diseases classified elsewhere without behavioral disturbance: Secondary | ICD-10-CM | POA: Diagnosis not present

## 2020-10-05 DIAGNOSIS — R2681 Unsteadiness on feet: Secondary | ICD-10-CM | POA: Diagnosis not present

## 2020-10-05 DIAGNOSIS — G20 Parkinson's disease: Secondary | ICD-10-CM | POA: Diagnosis not present

## 2020-10-05 DIAGNOSIS — R2689 Other abnormalities of gait and mobility: Secondary | ICD-10-CM | POA: Diagnosis not present

## 2020-10-06 DIAGNOSIS — W19XXXD Unspecified fall, subsequent encounter: Secondary | ICD-10-CM | POA: Diagnosis not present

## 2020-10-06 DIAGNOSIS — R2689 Other abnormalities of gait and mobility: Secondary | ICD-10-CM | POA: Diagnosis not present

## 2020-10-06 DIAGNOSIS — M6281 Muscle weakness (generalized): Secondary | ICD-10-CM | POA: Diagnosis not present

## 2020-10-06 DIAGNOSIS — R2681 Unsteadiness on feet: Secondary | ICD-10-CM | POA: Diagnosis not present

## 2020-10-06 DIAGNOSIS — F0280 Dementia in other diseases classified elsewhere without behavioral disturbance: Secondary | ICD-10-CM | POA: Diagnosis not present

## 2020-10-06 DIAGNOSIS — G20 Parkinson's disease: Secondary | ICD-10-CM | POA: Diagnosis not present

## 2020-10-07 DIAGNOSIS — G20 Parkinson's disease: Secondary | ICD-10-CM | POA: Diagnosis not present

## 2020-10-07 DIAGNOSIS — R2681 Unsteadiness on feet: Secondary | ICD-10-CM | POA: Diagnosis not present

## 2020-10-07 DIAGNOSIS — M6281 Muscle weakness (generalized): Secondary | ICD-10-CM | POA: Diagnosis not present

## 2020-10-07 DIAGNOSIS — R2689 Other abnormalities of gait and mobility: Secondary | ICD-10-CM | POA: Diagnosis not present

## 2020-10-07 DIAGNOSIS — W19XXXD Unspecified fall, subsequent encounter: Secondary | ICD-10-CM | POA: Diagnosis not present

## 2020-10-07 DIAGNOSIS — F0280 Dementia in other diseases classified elsewhere without behavioral disturbance: Secondary | ICD-10-CM | POA: Diagnosis not present

## 2020-10-08 DIAGNOSIS — G20 Parkinson's disease: Secondary | ICD-10-CM | POA: Diagnosis not present

## 2020-10-08 DIAGNOSIS — F0280 Dementia in other diseases classified elsewhere without behavioral disturbance: Secondary | ICD-10-CM | POA: Diagnosis not present

## 2020-10-08 DIAGNOSIS — M6281 Muscle weakness (generalized): Secondary | ICD-10-CM | POA: Diagnosis not present

## 2020-10-08 DIAGNOSIS — W19XXXD Unspecified fall, subsequent encounter: Secondary | ICD-10-CM | POA: Diagnosis not present

## 2020-10-08 DIAGNOSIS — R2689 Other abnormalities of gait and mobility: Secondary | ICD-10-CM | POA: Diagnosis not present

## 2020-10-08 DIAGNOSIS — R2681 Unsteadiness on feet: Secondary | ICD-10-CM | POA: Diagnosis not present

## 2020-10-09 DIAGNOSIS — G20 Parkinson's disease: Secondary | ICD-10-CM | POA: Diagnosis not present

## 2020-10-09 DIAGNOSIS — F0280 Dementia in other diseases classified elsewhere without behavioral disturbance: Secondary | ICD-10-CM | POA: Diagnosis not present

## 2020-10-09 DIAGNOSIS — M6281 Muscle weakness (generalized): Secondary | ICD-10-CM | POA: Diagnosis not present

## 2020-10-09 DIAGNOSIS — R2681 Unsteadiness on feet: Secondary | ICD-10-CM | POA: Diagnosis not present

## 2020-10-09 DIAGNOSIS — R2689 Other abnormalities of gait and mobility: Secondary | ICD-10-CM | POA: Diagnosis not present

## 2020-10-09 DIAGNOSIS — W19XXXD Unspecified fall, subsequent encounter: Secondary | ICD-10-CM | POA: Diagnosis not present

## 2020-10-11 DIAGNOSIS — F0280 Dementia in other diseases classified elsewhere without behavioral disturbance: Secondary | ICD-10-CM | POA: Diagnosis not present

## 2020-10-11 DIAGNOSIS — M6281 Muscle weakness (generalized): Secondary | ICD-10-CM | POA: Diagnosis not present

## 2020-10-11 DIAGNOSIS — W19XXXD Unspecified fall, subsequent encounter: Secondary | ICD-10-CM | POA: Diagnosis not present

## 2020-10-11 DIAGNOSIS — R2681 Unsteadiness on feet: Secondary | ICD-10-CM | POA: Diagnosis not present

## 2020-10-11 DIAGNOSIS — R2689 Other abnormalities of gait and mobility: Secondary | ICD-10-CM | POA: Diagnosis not present

## 2020-10-11 DIAGNOSIS — G20 Parkinson's disease: Secondary | ICD-10-CM | POA: Diagnosis not present

## 2020-10-12 DIAGNOSIS — R2689 Other abnormalities of gait and mobility: Secondary | ICD-10-CM | POA: Diagnosis not present

## 2020-10-12 DIAGNOSIS — M6281 Muscle weakness (generalized): Secondary | ICD-10-CM | POA: Diagnosis not present

## 2020-10-12 DIAGNOSIS — R2681 Unsteadiness on feet: Secondary | ICD-10-CM | POA: Diagnosis not present

## 2020-10-12 DIAGNOSIS — W19XXXD Unspecified fall, subsequent encounter: Secondary | ICD-10-CM | POA: Diagnosis not present

## 2020-10-12 DIAGNOSIS — F0280 Dementia in other diseases classified elsewhere without behavioral disturbance: Secondary | ICD-10-CM | POA: Diagnosis not present

## 2020-10-12 DIAGNOSIS — G20 Parkinson's disease: Secondary | ICD-10-CM | POA: Diagnosis not present

## 2020-10-14 DIAGNOSIS — R2681 Unsteadiness on feet: Secondary | ICD-10-CM | POA: Diagnosis not present

## 2020-10-14 DIAGNOSIS — Z741 Need for assistance with personal care: Secondary | ICD-10-CM | POA: Diagnosis not present

## 2020-10-14 DIAGNOSIS — G20 Parkinson's disease: Secondary | ICD-10-CM | POA: Diagnosis not present

## 2020-10-14 DIAGNOSIS — R4189 Other symptoms and signs involving cognitive functions and awareness: Secondary | ICD-10-CM | POA: Diagnosis not present

## 2020-10-14 DIAGNOSIS — F0280 Dementia in other diseases classified elsewhere without behavioral disturbance: Secondary | ICD-10-CM | POA: Diagnosis not present

## 2020-10-14 DIAGNOSIS — R278 Other lack of coordination: Secondary | ICD-10-CM | POA: Diagnosis not present

## 2020-10-14 DIAGNOSIS — W19XXXD Unspecified fall, subsequent encounter: Secondary | ICD-10-CM | POA: Diagnosis not present

## 2020-10-14 DIAGNOSIS — R2689 Other abnormalities of gait and mobility: Secondary | ICD-10-CM | POA: Diagnosis not present

## 2020-10-14 DIAGNOSIS — M6281 Muscle weakness (generalized): Secondary | ICD-10-CM | POA: Diagnosis not present

## 2020-10-19 DIAGNOSIS — G20 Parkinson's disease: Secondary | ICD-10-CM | POA: Diagnosis not present

## 2020-10-19 DIAGNOSIS — R2681 Unsteadiness on feet: Secondary | ICD-10-CM | POA: Diagnosis not present

## 2020-10-19 DIAGNOSIS — W19XXXD Unspecified fall, subsequent encounter: Secondary | ICD-10-CM | POA: Diagnosis not present

## 2020-10-19 DIAGNOSIS — M6281 Muscle weakness (generalized): Secondary | ICD-10-CM | POA: Diagnosis not present

## 2020-10-19 DIAGNOSIS — R2689 Other abnormalities of gait and mobility: Secondary | ICD-10-CM | POA: Diagnosis not present

## 2020-10-19 DIAGNOSIS — F0280 Dementia in other diseases classified elsewhere without behavioral disturbance: Secondary | ICD-10-CM | POA: Diagnosis not present

## 2020-10-26 DIAGNOSIS — R2689 Other abnormalities of gait and mobility: Secondary | ICD-10-CM | POA: Diagnosis not present

## 2020-10-26 DIAGNOSIS — F0280 Dementia in other diseases classified elsewhere without behavioral disturbance: Secondary | ICD-10-CM | POA: Diagnosis not present

## 2020-10-26 DIAGNOSIS — M6281 Muscle weakness (generalized): Secondary | ICD-10-CM | POA: Diagnosis not present

## 2020-10-26 DIAGNOSIS — R2681 Unsteadiness on feet: Secondary | ICD-10-CM | POA: Diagnosis not present

## 2020-10-26 DIAGNOSIS — W19XXXD Unspecified fall, subsequent encounter: Secondary | ICD-10-CM | POA: Diagnosis not present

## 2020-10-26 DIAGNOSIS — G20 Parkinson's disease: Secondary | ICD-10-CM | POA: Diagnosis not present

## 2020-10-27 DIAGNOSIS — G20 Parkinson's disease: Secondary | ICD-10-CM | POA: Diagnosis not present

## 2020-10-27 DIAGNOSIS — R2681 Unsteadiness on feet: Secondary | ICD-10-CM | POA: Diagnosis not present

## 2020-10-27 DIAGNOSIS — F0280 Dementia in other diseases classified elsewhere without behavioral disturbance: Secondary | ICD-10-CM | POA: Diagnosis not present

## 2020-10-27 DIAGNOSIS — W19XXXD Unspecified fall, subsequent encounter: Secondary | ICD-10-CM | POA: Diagnosis not present

## 2020-10-27 DIAGNOSIS — M6281 Muscle weakness (generalized): Secondary | ICD-10-CM | POA: Diagnosis not present

## 2020-10-27 DIAGNOSIS — R2689 Other abnormalities of gait and mobility: Secondary | ICD-10-CM | POA: Diagnosis not present

## 2020-11-01 DIAGNOSIS — G20 Parkinson's disease: Secondary | ICD-10-CM | POA: Diagnosis not present

## 2020-11-01 DIAGNOSIS — R2681 Unsteadiness on feet: Secondary | ICD-10-CM | POA: Diagnosis not present

## 2020-11-01 DIAGNOSIS — W19XXXD Unspecified fall, subsequent encounter: Secondary | ICD-10-CM | POA: Diagnosis not present

## 2020-11-01 DIAGNOSIS — M6281 Muscle weakness (generalized): Secondary | ICD-10-CM | POA: Diagnosis not present

## 2020-11-01 DIAGNOSIS — R2689 Other abnormalities of gait and mobility: Secondary | ICD-10-CM | POA: Diagnosis not present

## 2020-11-01 DIAGNOSIS — F0280 Dementia in other diseases classified elsewhere without behavioral disturbance: Secondary | ICD-10-CM | POA: Diagnosis not present

## 2020-11-02 DIAGNOSIS — F0280 Dementia in other diseases classified elsewhere without behavioral disturbance: Secondary | ICD-10-CM | POA: Diagnosis not present

## 2020-11-02 DIAGNOSIS — R2689 Other abnormalities of gait and mobility: Secondary | ICD-10-CM | POA: Diagnosis not present

## 2020-11-02 DIAGNOSIS — M6281 Muscle weakness (generalized): Secondary | ICD-10-CM | POA: Diagnosis not present

## 2020-11-02 DIAGNOSIS — R2681 Unsteadiness on feet: Secondary | ICD-10-CM | POA: Diagnosis not present

## 2020-11-02 DIAGNOSIS — G20 Parkinson's disease: Secondary | ICD-10-CM | POA: Diagnosis not present

## 2020-11-02 DIAGNOSIS — W19XXXD Unspecified fall, subsequent encounter: Secondary | ICD-10-CM | POA: Diagnosis not present

## 2020-11-03 ENCOUNTER — Emergency Department: Payer: Medicare Other

## 2020-11-03 ENCOUNTER — Inpatient Hospital Stay: Payer: Medicare Other

## 2020-11-03 ENCOUNTER — Inpatient Hospital Stay
Admission: EM | Admit: 2020-11-03 | Discharge: 2020-11-06 | DRG: 480 | Disposition: A | Discharge status: Skilled Nursing Facility | Payer: Medicare Other | Source: Skilled Nursing Facility | Attending: Internal Medicine | Admitting: Internal Medicine

## 2020-11-03 ENCOUNTER — Encounter
Admission: EM | Disposition: A | Discharge status: Skilled Nursing Facility | Payer: Self-pay | Source: Skilled Nursing Facility | Attending: Internal Medicine

## 2020-11-03 ENCOUNTER — Encounter: Payer: Self-pay | Admitting: Internal Medicine

## 2020-11-03 ENCOUNTER — Inpatient Hospital Stay: Payer: Medicare Other | Admitting: Anesthesiology

## 2020-11-03 ENCOUNTER — Other Ambulatory Visit: Payer: Self-pay

## 2020-11-03 DIAGNOSIS — S42291D Other displaced fracture of upper end of right humerus, subsequent encounter for fracture with routine healing: Secondary | ICD-10-CM | POA: Diagnosis not present

## 2020-11-03 DIAGNOSIS — I10 Essential (primary) hypertension: Secondary | ICD-10-CM | POA: Diagnosis present

## 2020-11-03 DIAGNOSIS — I6523 Occlusion and stenosis of bilateral carotid arteries: Secondary | ICD-10-CM | POA: Diagnosis not present

## 2020-11-03 DIAGNOSIS — F02C3 Parkinson's disease without dyskinesia, without mention of fluctuations: Secondary | ICD-10-CM | POA: Diagnosis present

## 2020-11-03 DIAGNOSIS — S42211A Unspecified displaced fracture of surgical neck of right humerus, initial encounter for closed fracture: Secondary | ICD-10-CM | POA: Diagnosis present

## 2020-11-03 DIAGNOSIS — Z79899 Other long term (current) drug therapy: Secondary | ICD-10-CM

## 2020-11-03 DIAGNOSIS — G20 Parkinson's disease: Secondary | ICD-10-CM | POA: Diagnosis not present

## 2020-11-03 DIAGNOSIS — Z886 Allergy status to analgesic agent status: Secondary | ICD-10-CM

## 2020-11-03 DIAGNOSIS — R0902 Hypoxemia: Secondary | ICD-10-CM | POA: Diagnosis not present

## 2020-11-03 DIAGNOSIS — Z888 Allergy status to other drugs, medicaments and biological substances status: Secondary | ICD-10-CM | POA: Diagnosis not present

## 2020-11-03 DIAGNOSIS — Z91018 Allergy to other foods: Secondary | ICD-10-CM | POA: Diagnosis not present

## 2020-11-03 DIAGNOSIS — R404 Transient alteration of awareness: Secondary | ICD-10-CM | POA: Diagnosis not present

## 2020-11-03 DIAGNOSIS — S72142A Displaced intertrochanteric fracture of left femur, initial encounter for closed fracture: Secondary | ICD-10-CM | POA: Diagnosis present

## 2020-11-03 DIAGNOSIS — K219 Gastro-esophageal reflux disease without esophagitis: Secondary | ICD-10-CM | POA: Diagnosis not present

## 2020-11-03 DIAGNOSIS — E440 Moderate protein-calorie malnutrition: Secondary | ICD-10-CM | POA: Diagnosis present

## 2020-11-03 DIAGNOSIS — Z91013 Allergy to seafood: Secondary | ICD-10-CM

## 2020-11-03 DIAGNOSIS — Z7401 Bed confinement status: Secondary | ICD-10-CM | POA: Diagnosis not present

## 2020-11-03 DIAGNOSIS — Z681 Body mass index (BMI) 19 or less, adult: Secondary | ICD-10-CM | POA: Diagnosis not present

## 2020-11-03 DIAGNOSIS — W19XXXA Unspecified fall, initial encounter: Secondary | ICD-10-CM | POA: Diagnosis not present

## 2020-11-03 DIAGNOSIS — F0280 Dementia in other diseases classified elsewhere without behavioral disturbance: Secondary | ICD-10-CM | POA: Diagnosis present

## 2020-11-03 DIAGNOSIS — W1830XA Fall on same level, unspecified, initial encounter: Secondary | ICD-10-CM | POA: Diagnosis present

## 2020-11-03 DIAGNOSIS — Z419 Encounter for procedure for purposes other than remedying health state, unspecified: Secondary | ICD-10-CM

## 2020-11-03 DIAGNOSIS — Z66 Do not resuscitate: Secondary | ICD-10-CM | POA: Diagnosis present

## 2020-11-03 DIAGNOSIS — M2550 Pain in unspecified joint: Secondary | ICD-10-CM | POA: Diagnosis not present

## 2020-11-03 DIAGNOSIS — M25512 Pain in left shoulder: Secondary | ICD-10-CM | POA: Diagnosis not present

## 2020-11-03 DIAGNOSIS — J9811 Atelectasis: Secondary | ICD-10-CM | POA: Diagnosis not present

## 2020-11-03 DIAGNOSIS — S72002A Fracture of unspecified part of neck of left femur, initial encounter for closed fracture: Secondary | ICD-10-CM | POA: Diagnosis not present

## 2020-11-03 DIAGNOSIS — Z043 Encounter for examination and observation following other accident: Secondary | ICD-10-CM | POA: Diagnosis not present

## 2020-11-03 DIAGNOSIS — S7292XA Unspecified fracture of left femur, initial encounter for closed fracture: Secondary | ICD-10-CM | POA: Diagnosis not present

## 2020-11-03 DIAGNOSIS — M47812 Spondylosis without myelopathy or radiculopathy, cervical region: Secondary | ICD-10-CM | POA: Diagnosis not present

## 2020-11-03 DIAGNOSIS — E441 Mild protein-calorie malnutrition: Secondary | ICD-10-CM | POA: Diagnosis not present

## 2020-11-03 DIAGNOSIS — S72145A Nondisplaced intertrochanteric fracture of left femur, initial encounter for closed fracture: Secondary | ICD-10-CM

## 2020-11-03 DIAGNOSIS — S42209A Unspecified fracture of upper end of unspecified humerus, initial encounter for closed fracture: Secondary | ICD-10-CM

## 2020-11-03 DIAGNOSIS — H9190 Unspecified hearing loss, unspecified ear: Secondary | ICD-10-CM | POA: Diagnosis present

## 2020-11-03 DIAGNOSIS — S7292XD Unspecified fracture of left femur, subsequent encounter for closed fracture with routine healing: Secondary | ICD-10-CM | POA: Diagnosis not present

## 2020-11-03 DIAGNOSIS — Z20822 Contact with and (suspected) exposure to covid-19: Secondary | ICD-10-CM | POA: Diagnosis present

## 2020-11-03 DIAGNOSIS — Z01818 Encounter for other preprocedural examination: Secondary | ICD-10-CM | POA: Diagnosis not present

## 2020-11-03 DIAGNOSIS — M79605 Pain in left leg: Secondary | ICD-10-CM | POA: Diagnosis not present

## 2020-11-03 DIAGNOSIS — R079 Chest pain, unspecified: Secondary | ICD-10-CM | POA: Diagnosis not present

## 2020-11-03 DIAGNOSIS — S42201S Unspecified fracture of upper end of right humerus, sequela: Secondary | ICD-10-CM | POA: Diagnosis not present

## 2020-11-03 DIAGNOSIS — J90 Pleural effusion, not elsewhere classified: Secondary | ICD-10-CM | POA: Diagnosis not present

## 2020-11-03 DIAGNOSIS — R52 Pain, unspecified: Secondary | ICD-10-CM | POA: Diagnosis not present

## 2020-11-03 DIAGNOSIS — M159 Polyosteoarthritis, unspecified: Secondary | ICD-10-CM | POA: Diagnosis not present

## 2020-11-03 DIAGNOSIS — R410 Disorientation, unspecified: Secondary | ICD-10-CM | POA: Diagnosis not present

## 2020-11-03 DIAGNOSIS — J9601 Acute respiratory failure with hypoxia: Secondary | ICD-10-CM | POA: Diagnosis present

## 2020-11-03 DIAGNOSIS — F0390 Unspecified dementia without behavioral disturbance: Secondary | ICD-10-CM | POA: Diagnosis not present

## 2020-11-03 HISTORY — PX: INTRAMEDULLARY (IM) NAIL INTERTROCHANTERIC: SHX5875

## 2020-11-03 LAB — PROCALCITONIN: Procalcitonin: <0.1 ng/mL

## 2020-11-03 LAB — CBC WITH DIFFERENTIAL/PLATELET
Abs Immature Granulocytes: 0.04 10*3/uL (ref 0.00–0.07)
Basophils Absolute: 0 10*3/uL (ref 0.0–0.1)
Basophils Relative: 1 %
Eosinophils Absolute: 0.1 10*3/uL (ref 0.0–0.5)
Eosinophils Relative: 2 %
HCT: 41.6 % (ref 36.0–46.0)
Hemoglobin: 14.4 g/dL (ref 12.0–15.0)
Immature Granulocytes: 1 %
Lymphocytes Relative: 25 %
Lymphs Abs: 1.7 10*3/uL (ref 0.7–4.0)
MCH: 31.6 pg (ref 26.0–34.0)
MCHC: 34.6 g/dL (ref 30.0–36.0)
MCV: 91.4 fL (ref 80.0–100.0)
Monocytes Absolute: 0.5 10*3/uL (ref 0.1–1.0)
Monocytes Relative: 7 %
Neutro Abs: 4.3 10*3/uL (ref 1.7–7.7)
Neutrophils Relative %: 64 %
Platelets: 191 10*3/uL (ref 150–400)
RBC: 4.55 MIL/uL (ref 3.87–5.11)
RDW: 13.2 % (ref 11.5–15.5)
WBC: 6.6 10*3/uL (ref 4.0–10.5)
nRBC: 0 % (ref 0.0–0.2)

## 2020-11-03 LAB — COMPREHENSIVE METABOLIC PANEL
ALT: 16 U/L (ref 0–44)
AST: 20 U/L (ref 15–41)
Albumin: 4.1 g/dL (ref 3.5–5.0)
Alkaline Phosphatase: 91 U/L (ref 38–126)
Anion gap: 9 (ref 5–15)
BUN: 22 mg/dL (ref 8–23)
CO2: 25 mmol/L (ref 22–32)
Calcium: 9 mg/dL (ref 8.9–10.3)
Chloride: 104 mmol/L (ref 98–111)
Creatinine, Ser: 0.53 mg/dL (ref 0.44–1.00)
GFR, Estimated: >60 mL/min (ref >60)
Glucose, Bld: 100 mg/dL — ABNORMAL HIGH (ref 70–99)
Potassium: 4 mmol/L (ref 3.5–5.1)
Sodium: 138 mmol/L (ref 135–145)
Total Bilirubin: 1 mg/dL (ref 0.3–1.2)
Total Protein: 7.1 g/dL (ref 6.5–8.1)

## 2020-11-03 LAB — RESP PANEL BY RT-PCR (FLU A&B, COVID) ARPGX2
Influenza A by PCR: NEGATIVE
Influenza B by PCR: NEGATIVE
SARS Coronavirus 2 by RT PCR: NEGATIVE

## 2020-11-03 LAB — APTT: aPTT: 28 seconds (ref 24–36)

## 2020-11-03 LAB — PROTIME-INR
INR: 1 (ref 0.8–1.2)
Prothrombin Time: 13.4 seconds (ref 11.4–15.2)

## 2020-11-03 LAB — TROPONIN I (HIGH SENSITIVITY): Troponin I (High Sensitivity): 4 ng/L (ref <18)

## 2020-11-03 LAB — BRAIN NATRIURETIC PEPTIDE: B Natriuretic Peptide: 101 pg/mL — ABNORMAL HIGH (ref 0.0–100.0)

## 2020-11-03 SURGERY — FIXATION, FRACTURE, INTERTROCHANTERIC, WITH INTRAMEDULLARY ROD
Anesthesia: Spinal | Laterality: Left

## 2020-11-03 MED ORDER — LORATADINE 10 MG PO TABS
10.0000 mg | ORAL_TABLET | Freq: Every day | ORAL | Status: DC
  Administered 2020-11-04 – 2020-11-06 (×3): 10 mg via ORAL
  Filled 2020-11-03 (×3): qty 1

## 2020-11-03 MED ORDER — BISACODYL 10 MG RE SUPP
10.0000 mg | Freq: Every day | RECTAL | Status: DC | PRN
  Administered 2020-11-05: 10 mg via RECTAL
  Filled 2020-11-03: qty 1

## 2020-11-03 MED ORDER — BUPIVACAINE-EPINEPHRINE (PF) 0.5% -1:200000 IJ SOLN
INTRAMUSCULAR | Status: DC | PRN
  Administered 2020-11-03: 20 mL

## 2020-11-03 MED ORDER — ADULT MULTIVITAMIN W/MINERALS CH
ORAL_TABLET | Freq: Every day | ORAL | Status: DC
  Administered 2020-11-04 – 2020-11-06 (×3): 1 via ORAL
  Filled 2020-11-03 (×3): qty 1

## 2020-11-03 MED ORDER — PROPOFOL 10 MG/ML IV BOLUS
INTRAVENOUS | Status: DC | PRN
  Administered 2020-11-03: 20 mg via INTRAVENOUS
  Administered 2020-11-03: 30 mg via INTRAVENOUS
  Administered 2020-11-03: 10 mg via INTRAVENOUS
  Administered 2020-11-03: 20 mg via INTRAVENOUS

## 2020-11-03 MED ORDER — ACETAMINOPHEN 500 MG PO TABS
500.0000 mg | ORAL_TABLET | Freq: Four times a day (QID) | ORAL | Status: AC
  Administered 2020-11-03: 500 mg via ORAL
  Filled 2020-11-03 (×2): qty 1

## 2020-11-03 MED ORDER — ENOXAPARIN SODIUM 40 MG/0.4ML ~~LOC~~ SOLN
40.0000 mg | SUBCUTANEOUS | Status: DC
  Administered 2020-11-04 – 2020-11-06 (×3): 40 mg via SUBCUTANEOUS
  Filled 2020-11-03 (×3): qty 0.4

## 2020-11-03 MED ORDER — FENTANYL CITRATE (PF) 100 MCG/2ML IJ SOLN
50.0000 ug | Freq: Once | INTRAMUSCULAR | Status: AC
Start: 2020-11-03 — End: 2020-11-03
  Administered 2020-11-03: 50 ug via INTRAVENOUS

## 2020-11-03 MED ORDER — BUPIVACAINE HCL (PF) 0.5 % IJ SOLN
INTRAMUSCULAR | Status: AC
  Filled 2020-11-03: qty 10

## 2020-11-03 MED ORDER — FENTANYL CITRATE (PF) 100 MCG/2ML IJ SOLN: 25.0000 ug | INTRAMUSCULAR | Status: DC | PRN

## 2020-11-03 MED ORDER — HYDROCODONE-ACETAMINOPHEN 5-325 MG PO TABS: 1.0000 | ORAL_TABLET | ORAL | Status: DC | PRN

## 2020-11-03 MED ORDER — ONDANSETRON HCL 4 MG/2ML IJ SOLN: 4.0000 mg | Freq: Once | INTRAMUSCULAR | Status: DC | PRN

## 2020-11-03 MED ORDER — PROPOFOL 500 MG/50ML IV EMUL
INTRAVENOUS | Status: DC | PRN
  Administered 2020-11-03: 30 ug/kg/min via INTRAVENOUS

## 2020-11-03 MED ORDER — CARBIDOPA-LEVODOPA 25-100 MG PO TABS: 1.0000 | ORAL_TABLET | Freq: Three times a day (TID) | ORAL | Status: DC

## 2020-11-03 MED ORDER — FUROSEMIDE 10 MG/ML IJ SOLN
20.0000 mg | Freq: Once | INTRAMUSCULAR | Status: AC
  Administered 2020-11-03: 20 mg via INTRAVENOUS
  Filled 2020-11-03: qty 4

## 2020-11-03 MED ORDER — PHENYLEPHRINE HCL (PRESSORS) 10 MG/ML IV SOLN
INTRAVENOUS | Status: DC | PRN
  Administered 2020-11-03: 100 ug via INTRAVENOUS
  Administered 2020-11-03 (×2): 50 ug via INTRAVENOUS

## 2020-11-03 MED ORDER — DIPHENHYDRAMINE HCL 12.5 MG/5ML PO ELIX
12.5000 mg | ORAL_SOLUTION | ORAL | Status: DC | PRN
Start: 2020-11-03 — End: 2020-11-06

## 2020-11-03 MED ORDER — TRAMADOL HCL 50 MG PO TABS
50.0000 mg | ORAL_TABLET | Freq: Four times a day (QID) | ORAL | Status: DC | PRN
  Administered 2020-11-04: 50 mg via ORAL
  Filled 2020-11-03 (×2): qty 1

## 2020-11-03 MED ORDER — METOCLOPRAMIDE HCL 10 MG PO TABS: 5.0000 mg | ORAL_TABLET | Freq: Three times a day (TID) | ORAL | Status: DC | PRN

## 2020-11-03 MED ORDER — ONDANSETRON HCL 4 MG/2ML IJ SOLN: 4.0000 mg | Freq: Four times a day (QID) | INTRAMUSCULAR | Status: DC | PRN

## 2020-11-03 MED ORDER — CEFAZOLIN SODIUM-DEXTROSE 2-4 GM/100ML-% IV SOLN
2.0000 g | Freq: Four times a day (QID) | INTRAVENOUS | Status: AC
  Administered 2020-11-03 – 2020-11-04 (×3): 2 g via INTRAVENOUS
  Filled 2020-11-03 (×3): qty 100

## 2020-11-03 MED ORDER — METOCLOPRAMIDE HCL 5 MG/ML IJ SOLN: 5.0000 mg | Freq: Three times a day (TID) | INTRAMUSCULAR | Status: DC | PRN

## 2020-11-03 MED ORDER — MAGNESIUM HYDROXIDE 400 MG/5ML PO SUSP: 30.0000 mL | Freq: Every day | ORAL | Status: DC | PRN

## 2020-11-03 MED ORDER — MORPHINE SULFATE (PF) 2 MG/ML IV SOLN: 0.5000 mg | INTRAVENOUS | Status: DC | PRN

## 2020-11-03 MED ORDER — FLEET ENEMA 7-19 GM/118ML RE ENEM: 1.0000 | ENEMA | Freq: Once | RECTAL | Status: DC | PRN

## 2020-11-03 MED ORDER — PROPOFOL 1000 MG/100ML IV EMUL
INTRAVENOUS | Status: AC
  Filled 2020-11-03: qty 100

## 2020-11-03 MED ORDER — SODIUM CHLORIDE 0.9 % IV SOLN
INTRAVENOUS | Status: DC
  Administered 2020-11-04: 500 mL via INTRAVENOUS
  Administered 2020-11-04: 1000 mL via INTRAVENOUS

## 2020-11-03 MED ORDER — HYDRALAZINE HCL 25 MG PO TABS: 25.0000 mg | ORAL_TABLET | Freq: Three times a day (TID) | ORAL | Status: AC | PRN

## 2020-11-03 MED ORDER — CARBIDOPA-LEVODOPA 25-100 MG PO TABS
2.0000 | ORAL_TABLET | Freq: Every morning | ORAL | Status: DC
  Administered 2020-11-04 – 2020-11-06 (×3): 2 via ORAL
  Filled 2020-11-03 (×3): qty 2

## 2020-11-03 MED ORDER — HYDROCODONE-ACETAMINOPHEN 5-325 MG PO TABS: 1.0000 | ORAL_TABLET | Freq: Four times a day (QID) | ORAL | Status: DC | PRN

## 2020-11-03 MED ORDER — CEFAZOLIN SODIUM-DEXTROSE 2-4 GM/100ML-% IV SOLN
2.0000 g | INTRAVENOUS | Status: AC
  Administered 2020-11-03: 2 g via INTRAVENOUS
  Filled 2020-11-03: qty 100

## 2020-11-03 MED ORDER — HYDRALAZINE HCL 50 MG PO TABS: 25.0000 mg | ORAL_TABLET | Freq: Three times a day (TID) | ORAL | Status: DC | PRN

## 2020-11-03 MED ORDER — IOHEXOL 350 MG/ML SOLN
50.0000 mL | Freq: Once | INTRAVENOUS | Status: AC | PRN
  Administered 2020-11-03: 50 mL via INTRAVENOUS

## 2020-11-03 MED ORDER — DOCUSATE SODIUM 100 MG PO CAPS
100.0000 mg | ORAL_CAPSULE | Freq: Two times a day (BID) | ORAL | Status: DC
  Administered 2020-11-03 – 2020-11-06 (×6): 100 mg via ORAL
  Filled 2020-11-03 (×6): qty 1

## 2020-11-03 MED ORDER — HYDROMORPHONE HCL 1 MG/ML IJ SOLN: 0.5000 mg | Freq: Once | INTRAMUSCULAR | Status: DC

## 2020-11-03 MED ORDER — NITROGLYCERIN 0.4 MG SL SUBL: 0.4000 mg | SUBLINGUAL_TABLET | SUBLINGUAL | Status: DC | PRN

## 2020-11-03 MED ORDER — ONDANSETRON HCL 4 MG PO TABS: 4.0000 mg | ORAL_TABLET | Freq: Four times a day (QID) | ORAL | Status: DC | PRN

## 2020-11-03 MED ORDER — FENTANYL CITRATE (PF) 100 MCG/2ML IJ SOLN
25.0000 ug | Freq: Once | INTRAMUSCULAR | Status: DC
  Filled 2020-11-03: qty 2

## 2020-11-03 MED ORDER — CEFAZOLIN SODIUM-DEXTROSE 2-4 GM/100ML-% IV SOLN
INTRAVENOUS | Status: AC
  Filled 2020-11-03: qty 100

## 2020-11-03 MED ORDER — CARBIDOPA-LEVODOPA 25-100 MG PO TABS: 1.0000 | ORAL_TABLET | ORAL | Status: DC

## 2020-11-03 MED ORDER — HYDROCODONE-ACETAMINOPHEN 5-325 MG PO TABS: 1.0000 | ORAL_TABLET | Freq: Three times a day (TID) | ORAL | Status: DC | PRN

## 2020-11-03 MED ORDER — BUPIVACAINE HCL (PF) 0.5 % IJ SOLN
INTRAMUSCULAR | Status: DC | PRN
  Administered 2020-11-03: 2.2 mL

## 2020-11-03 MED ORDER — LACTATED RINGERS IV SOLN: INTRAVENOUS | Status: DC | PRN

## 2020-11-03 MED ORDER — ACETAMINOPHEN 325 MG PO TABS
325.0000 mg | ORAL_TABLET | Freq: Four times a day (QID) | ORAL | Status: DC | PRN
Start: 2020-11-04 — End: 2020-11-06
  Administered 2020-11-04: 325 mg via ORAL
  Administered 2020-11-06: 650 mg via ORAL
  Filled 2020-11-03 (×2): qty 2

## 2020-11-03 MED ORDER — LABETALOL HCL 5 MG/ML IV SOLN: 5.0000 mg | INTRAVENOUS | Status: DC | PRN

## 2020-11-03 MED ORDER — HYDROMORPHONE HCL 1 MG/ML IJ SOLN
0.5000 mg | Freq: Once | INTRAMUSCULAR | Status: AC | PRN
  Administered 2020-11-03: 0.5 mg via INTRAVENOUS
  Filled 2020-11-03: qty 1

## 2020-11-03 MED ORDER — ALUMINUM-PETROLATUM-ZINC (1-2-3 PASTE) 0.027-13.7-10% PASTE
1.0000 "application " | PASTE | CUTANEOUS | Status: DC | PRN
  Filled 2020-11-03 (×2): qty 1

## 2020-11-03 MED ORDER — MORPHINE SULFATE (PF) 2 MG/ML IV SOLN
0.5000 mg | INTRAVENOUS | Status: DC | PRN
  Administered 2020-11-03: 0.5 mg via INTRAVENOUS
  Filled 2020-11-03: qty 1

## 2020-11-03 MED ORDER — ONDANSETRON HCL 4 MG/2ML IJ SOLN
4.0000 mg | Freq: Once | INTRAMUSCULAR | Status: AC
  Administered 2020-11-03: 4 mg via INTRAVENOUS
  Filled 2020-11-03: qty 2

## 2020-11-03 SURGICAL SUPPLY — 45 items
APL PRP STRL LF DISP 70% ISPRP (MISCELLANEOUS) ×2
BIT DRILL 4.3MMS DISTAL GRDTED (BIT) ×1 IMPLANT
BNDG COHESIVE 4X5 TAN STRL (GAUZE/BANDAGES/DRESSINGS) ×2 IMPLANT
BNDG COHESIVE 6X5 TAN STRL LF (GAUZE/BANDAGES/DRESSINGS) ×2 IMPLANT
CANISTER SUCT 1200ML W/VALVE (MISCELLANEOUS) IMPLANT
CHLORAPREP W/TINT 26 (MISCELLANEOUS) ×4 IMPLANT
COVER WAND RF STERILE (DRAPES) ×2 IMPLANT
DRAPE 3/4 80X56 (DRAPES) ×2 IMPLANT
DRAPE C-ARMOR (DRAPES) ×2 IMPLANT
DRILL 4.3MMS DISTAL GRADUATED (BIT) ×2
DRSG MEPILEX SACRM 8.7X9.8 (GAUZE/BANDAGES/DRESSINGS) ×2 IMPLANT
DRSG OPSITE POSTOP 3X4 (GAUZE/BANDAGES/DRESSINGS) IMPLANT
DRSG OPSITE POSTOP 4X6 (GAUZE/BANDAGES/DRESSINGS) ×6 IMPLANT
ELECT CAUTERY BLADE 6.4 (BLADE) ×2 IMPLANT
ELECT REM PT RETURN 9FT ADLT (ELECTROSURGICAL) ×2
ELECTRODE REM PT RTRN 9FT ADLT (ELECTROSURGICAL) ×1 IMPLANT
GAUZE SPONGE 4X4 12PLY STRL (GAUZE/BANDAGES/DRESSINGS) ×2 IMPLANT
GLOVE SURG ENC MOIS LTX SZ8 (GLOVE) ×4 IMPLANT
GLOVE SURG UNDER LTX SZ8 (GLOVE) ×2 IMPLANT
GOWN STRL REUS W/ TWL LRG LVL3 (GOWN DISPOSABLE) ×1 IMPLANT
GOWN STRL REUS W/ TWL XL LVL3 (GOWN DISPOSABLE) ×1 IMPLANT
GOWN STRL REUS W/TWL LRG LVL3 (GOWN DISPOSABLE) ×2
GOWN STRL REUS W/TWL XL LVL3 (GOWN DISPOSABLE) ×2
GUIDEPIN VERSANAIL DSP 3.2X444 (ORTHOPEDIC DISPOSABLE SUPPLIES) ×2 IMPLANT
GUIDEWIRE BALL NOSE 100CM (WIRE) ×2 IMPLANT
HIP FRAC NAIL LEFT 11X360MM (Orthopedic Implant) ×2 IMPLANT
MANIFOLD NEPTUNE II (INSTRUMENTS) ×2 IMPLANT
MAT ABSORB  FLUID 56X50 GRAY (MISCELLANEOUS) ×1
MAT ABSORB FLUID 56X50 GRAY (MISCELLANEOUS) ×1 IMPLANT
NAIL HIP FRAC LEFT 11X360MM (Orthopedic Implant) ×1 IMPLANT
NEEDLE FILTER BLUNT 18X 1/2SAF (NEEDLE) ×1
NEEDLE FILTER BLUNT 18X1 1/2 (NEEDLE) ×1 IMPLANT
NEEDLE HYPO 22GX1.5 SAFETY (NEEDLE) ×2 IMPLANT
NS IRRIG 500ML POUR BTL (IV SOLUTION) ×2 IMPLANT
PACK HIP COMPR (MISCELLANEOUS) ×2 IMPLANT
SCREW BONE CORTICAL 5.0X42 (Screw) ×2 IMPLANT
SCREW LAG HIP NAIL 10.5X95 (Screw) ×2 IMPLANT
STAPLER SKIN PROX 35W (STAPLE) ×2 IMPLANT
STRAP SAFETY 5IN WIDE (MISCELLANEOUS) ×2 IMPLANT
SUT VIC AB 0 CT1 36 (SUTURE) ×2 IMPLANT
SUT VIC AB 1 CT1 36 (SUTURE) ×2 IMPLANT
SUT VIC AB 2-0 CT1 (SUTURE) ×4 IMPLANT
SYR 10ML LL (SYRINGE) ×2 IMPLANT
SYR 30ML LL (SYRINGE) ×2 IMPLANT
TAPE MICROFOAM 4IN (TAPE) IMPLANT

## 2020-11-03 NOTE — ED Triage Notes (Signed)
EDP at bedside. Pt to ED via [facility] from [facility] assisted living facility. Pt had unwitnessed fall this morning. Pt denies LOC or head trauma. Pt c/o L leg pain (not hip pain) and EMS reports that pt has outrward rotation of L leg.  Pt has dementia and has DNR paperwork with her.

## 2020-11-03 NOTE — ED Notes (Signed)
ED secretary calling transport for pt.

## 2020-11-03 NOTE — Anesthesia Procedure Notes (Signed)
Spinal  Patient location during procedure: OR Start time: 11/03/2020 3:35 PM End time: 11/03/2020 3:43 PM Reason for block: surgical anesthesia Staffing Performed: anesthesiologist  Anesthesiologist: Tera Mater, MD Resident/CRNA: Hedda Slade, CRNA Preanesthetic Checklist Completed: patient identified, IV checked, site marked, risks and benefits discussed, surgical consent, monitors and equipment checked, pre-op evaluation and timeout performed Spinal Block Patient position: sitting Prep: ChloraPrep Patient monitoring: heart rate, continuous pulse ox, blood pressure and cardiac monitor Approach: midline Location: L3-4 Injection technique: single-shot Needle Needle type: Whitacre and Introducer  Needle gauge: 24 G Needle length: 9 cm Assessment Events: CSF return Additional Notes Negative paresthesia. Negative blood return. Positive free-flowing CSF. Expiration date of kit checked and confirmed. Patient tolerated procedure well, without complications.

## 2020-11-03 NOTE — ED Notes (Signed)
Dr Sedalia Muta at bedside with this nurse and CN. Dr Sedalia Muta does not want any BP meds to be given at this time for BP of 155/104. Dr Sedalia Muta wants pt to go to floor. She will put in note relating to this encounter. CN aware of situation and will communicate to floor CN.

## 2020-11-03 NOTE — ED Notes (Signed)
Informed Dr Fuller Plan of BP trends.

## 2020-11-03 NOTE — ED Notes (Signed)
Still in conversation with floor about floor taking pt. Floor was concerned about pt's BP trends. PRN labetalol is for SBP over 180. Before pt went to CT last time, SBP was 170. Pt back from CT now, SBP is 155. Informed floor of current BP. CN from ED and floor involved in conversation.   Pt had removed SPO2 sensor; reapplied to finger.  OR nurse that spoke with earlier also said that cefazolin is to be given right before surgery, not in ED.  Dr Sedalia Muta informed of BP trends and asked if she wants to give any PRN IV BP meds at this time.

## 2020-11-03 NOTE — ED Notes (Signed)
Pt in CT.

## 2020-11-03 NOTE — ED Notes (Signed)
SPO2 is 88-02% on RA. Applied 2L oxygen per [place].  Pt complaining that L leg still hurting severely. Will administer PRN dilaudid.  Pt crying.

## 2020-11-03 NOTE — ED Provider Notes (Signed)
[facility] Emergency Department Provider Note  ____________________________________________   Event Date/Time   First MD Initiated Contact with Patient 11/03/20 0813     (approximate)  I have reviewed the triage vital signs and the nursing notes.   HISTORY  Chief Complaint Fall   HPI Cheryl Cummings is a 80 y.o. female with hypertension, Parkinson's who comes in with fall.  [facility]  Patient was last seen at 6 AM.  Sometime in the past 2 hours patient had an unwitnessed fall.  Patient was found down on the ground.  No attempts were made to ambulate patient since the fall.  Patient does report leg pain bilaterally.  Seems to be more in the left side, hip, constant, worse with movement, better at rest.  Patient does have baseline dementia.  She does not think she hit her head or had LOC.  Denies any chest pain or shortness of breath or abdominal pain.          Past Medical History:  Diagnosis Date  . Essential hypertension, benign   . GERD (gastroesophageal reflux disease)   . Glaucoma   . Parkinson disease [facility](HCC)     Patient Active Problem List   Diagnosis Date Noted  . Acute appendicitis 03/07/2020  . Dementia (HCC) 12/18/2018  . Parkinson disease (HCC) 03/14/2017  . Malnutrition of mild degree (HCC) 03/14/2017  . GERD (gastroesophageal reflux disease)   . Glaucoma   . Generalized osteoarthritis of multiple sites     Past Surgical History:  Procedure Laterality Date  . LAPAROSCOPIC APPENDECTOMY N/A 03/07/2020   Procedure: APPENDECTOMY LAPAROSCOPIC;  Surgeon: Henrene DodgePiscoya, Jose, MD;  Location: [facility] ORS;  Service: General;  Laterality: N/A;  . VAGINAL DELIVERY     twice    Prior to Admission medications   Medication Sig Start Date End Date Taking? Authorizing Provider  acetaminophen (TYLENOL) 325 MG tablet Take 325-650 mg by mouth every 4 (four) hours as needed for mild pain or fever.    [provider]  alum & mag hydroxide-simeth (MINTOX) 200-200-20 MG/5ML suspension Take 30 mLs by mouth every 6 (six) hours as needed for indigestion or heartburn.    [provider]  bismuth subsalicylate (KAOPECTATE) 262 MG/15ML suspension Take 10 mLs by mouth every 6 (six) hours as needed for indigestion or diarrhea or loose stools.    [provider]  carbamide peroxide (DEBROX) 6.5 % OTIC solution Place 5 drops into both ears 2 (two) times daily as needed (wax buildup).    [provider]  carbidopa-levodopa (SINEMET IR) 25-100 MG tablet Take 1-2 tablets by mouth in the morning, at noon, in the evening, and at bedtime. 02/19/20   [provider]  cetirizine (ZYRTEC) 10 MG tablet Take 10 mg by mouth daily as needed for allergies.    [provider]  dextromethorphan-guaiFENesin (TUSSIN DM) 10-100 MG/5ML liquid Take 10 mLs by mouth every 4 (four) hours as needed for cough.    [provider]  diphenhydrAMINE (BENADRYL) 25 MG tablet Take 25 mg by mouth every 6 (six) hours as needed for itching or allergies.    [provider]  dorzolamide-timolol (COSOPT) 22.3-6.8 MG/ML ophthalmic solution Place 1 drop into both eyes 2 (two) times daily.    [provider]  magnesium hydroxide (MILK OF MAGNESIA) 400 MG/5ML suspension Take 30 mLs by mouth at bedtime as needed for mild constipation or moderate constipation.    [provider]  Multiple Vitamins-Minerals (MULTIVITAMIN WITH MINERALS) tablet Take 1 tablet by mouth daily.    [provider]  nitroGLYCERIN (NITROSTAT) 0.4 MG SL tablet Place 0.4 mg under the tongue as needed. 01/30/20   [provider]  ondansetron (ZOFRAN) 4 MG tablet Take 4 mg by mouth every 8 (eight) hours as needed for nausea or vomiting.    [provider]  oxyCODONE (OXY IR/ROXICODONE) 5 MG immediate release tablet Take 1 tablet (5 mg total) by mouth every 6 (six) hours as needed for  severe pain or breakthrough pain. 03/09/20   Donovan Kail, PA-C  TRAVATAN Z 0.004 % SOLN ophthalmic solution Place 1 drop into both eyes at bedtime.    [provider]  Vitamin D, Ergocalciferol, (DRISDOL) 50000 units CAPS capsule Take 1 capsule (50,000 Units total) by mouth every 30 (thirty) days. Patient taking differently: Take 50,000 Units by mouth. Every 28 days 03/14/17   [name], MD    Allergies Nsaids, Diazepam, Ibuprofen, Meperidine, Mushroom extract complex, Other, and Shellfish allergy  No family history on file.  Social History Social History   Tobacco Use  . Smoking status: Never Smoker  . Smokeless tobacco: Never Used  Substance Use Topics  . Alcohol use: No  . Drug use: No      Review of Systems Constitutional: No fever/chills Eyes: No visual changes. ENT: No sore throat. Cardiovascular: Denies chest pain. Respiratory: Denies shortness of breath. Gastrointestinal: No abdominal pain.  No nausea, no vomiting.  No diarrhea.  No constipation. Genitourinary: Negative for dysuria. Musculoskeletal: Negative for back pain.  Positive hip pain bilaterally worse left, shoulder pain  Skin: Negative for rash. Neurological: Negative for headaches, focal weakness or numbness. All other ROS negative ____________________________________________   PHYSICAL EXAM:  VITAL SIGNS: ED Triage Vitals [11/03/20 0811]  Enc Vitals Group     BP      Pulse      Resp      Temp      Temp src      SpO2      Weight 145 lb (65.8 kg)     Height 5\' 4"  (1.626 m)     Head Circumference      Peak Flow      Pain Score      Pain Loc      Pain Edu?      Excl. in GC?     Constitutional: Alert and oriented x1. GCS 15  Eyes: Conjunctivae are normal. EOMI. Head: Atraumatic. Nose: No congestion/rhinnorhea. Mouth/Throat: Mucous membranes are moist.   Neck: No stridor. Trachea Midline. FROM Cardiovascular: Normal rate, regular rhythm. Grossly normal heart sounds.   Good peripheral circulation. No chest wall tenderness Respiratory: Normal respiratory effort.  No retractions. Lungs CTAB.   Gastrointestinal: Soft and nontender. No distention. No abdominal bruits.  Musculoskeletal:   RUE: No point tenderness, deformity or other signs of injury. Radial pulse intact. Neuro intact. Full ROM in joint. LUE: slight tenderness on shoulder deformity or other signs of injury. Radial pulse intact. Neuro intact. Full ROM in joints RLE: Slightly tender on the right hip but not as bad as the left.  no Deformity or other signs of injury. DP pulse intact. Neuro intact.  Decreased range of motion secondary to pain but able to passively move LLE: Tenderness over the hip with leg internally rotated.. DP pulse intact. Neuro intact.  Decreased range of motion secondary to pain Neurologic:  Normal speech and language. No gross  focal neurologic deficits are appreciated.  Baseline dementia Skin:  Skin is warm, dry and intact. No rash noted. Psychiatric: Mood and affect are normal. Speech and behavior are normal. GU: Deferred   ____________________________________________   LABS (all labs ordered are listed, but only abnormal results are displayed)  Labs Reviewed  COMPREHENSIVE METABOLIC PANEL - Abnormal; Notable for the following components:      Result Value   Glucose, Bld 100 (*)    All other components within normal limits  BRAIN NATRIURETIC PEPTIDE - Abnormal; Notable for the following components:   B Natriuretic Peptide 101.0 (*)    All other components within normal limits  RESP PANEL BY RT-PCR (FLU A&B, COVID) ARPGX2  CBC WITH DIFFERENTIAL/PLATELET  PROTIME-INR  APTT  PROCALCITONIN  TROPONIN I (HIGH SENSITIVITY)   ____________________________________________   ED ECG REPORT I, Concha Se, the attending physician, personally viewed and interpreted this ECG.  Normal sinus rate of 82, no ST elevation, T wave version lead III, normal  intervals ____________________________________________  RADIOLOGY Vela Prose, personally viewed and evaluated these images (plain radiographs) as part of my medical decision making, as well as reviewing the written report by the radiologist.  ED MD interpretation: Intertrochanter fracture on the left.  Pulmonary edema bilaterally  Official radiology report(s): DG Tibia/Fibula Left  Result Date: 11/03/2020 CLINICAL DATA:  Left leg pain following a fall. EXAM: LEFT TIBIA AND FIBULA - 2 VIEW COMPARISON:  Left femur radiographs obtained the same time. FINDINGS: Diffuse osteopenia. No fracture or dislocation seen. IMPRESSION: No fracture or dislocation. Electronically Signed   By: Beckie Salts M.D.   On: 11/03/2020 10:05   CT Head Wo Contrast  Result Date: 11/03/2020 CLINICAL DATA:  Unwitnessed fall this morning. EXAM: CT HEAD WITHOUT CONTRAST CT CERVICAL SPINE WITHOUT CONTRAST TECHNIQUE: Multidetector CT imaging of the head and cervical spine was performed following the standard protocol without intravenous contrast. Multiplanar CT image reconstructions of the cervical spine were also generated. COMPARISON:  Head CT dated 07/14/2020 FINDINGS: CT HEAD FINDINGS Brain: Stable mildly enlarged ventricles and cortical sulci. Stable marked patchy white matter low density in both cerebral hemispheres. No intracranial hemorrhage, mass lesion or CT evidence of acute infarction. Vascular: No hyperdense vessel or unexpected calcification. Skull: Normal. Negative for fracture or focal lesion. Sinuses/Orbits: Unremarkable. Other: None. In the increasing in each CT CERVICAL SPINE FINDINGS Alignment: Normal. Skull base and vertebrae: No acute fracture. No primary bone lesion or focal pathologic process. Soft tissues and spinal canal: No prevertebral fluid or swelling. No visible canal hematoma. Disc levels:  Mild degenerative changes at the C5-6 level. Upper chest: Clear lung apices. Other: Mildly enlarged thyroid  gland containing multiple nodules. The largest nodule on the left measures 2.1 cm in maximum diameter and the largest nodule on the right measures 1.6 cm in maximum diameter. Bilateral carotid artery calcifications. IMPRESSION: 1. No skull fracture or intracranial hemorrhage. 2. No cervical spine fracture or subluxation. 3. Stable mild diffuse cerebral and cerebellar atrophy. 4. Stable marked chronic small vessel white matter ischemic changes in both cerebral hemispheres. 5. Bilateral carotid artery atheromatous calcifications. 6. Mildly enlarged thyroid gland containing multiple nodules. Recommend thyroid US (ref: J Am Coll Radiol. 2015 Feb;12(2): 143-50). Electronically Signed   By: Beckie Salts M.D.   On: 11/03/2020 11:27   CT Cervical Spine Wo Contrast  Result Date: 11/03/2020 CLINICAL DATA:  Unwitnessed fall this morning. EXAM: CT HEAD WITHOUT CONTRAST CT CERVICAL SPINE WITHOUT CONTRAST TECHNIQUE: Multidetector CT imaging  of the head and cervical spine was performed following the standard protocol without intravenous contrast. Multiplanar CT image reconstructions of the cervical spine were also generated. COMPARISON:  Head CT dated 07/14/2020 FINDINGS: CT HEAD FINDINGS Brain: Stable mildly enlarged ventricles and cortical sulci. Stable marked patchy white matter low density in both cerebral hemispheres. No intracranial hemorrhage, mass lesion or CT evidence of acute infarction. Vascular: No hyperdense vessel or unexpected calcification. Skull: Normal. Negative for fracture or focal lesion. Sinuses/Orbits: Unremarkable. Other: None. In the increasing in each CT CERVICAL SPINE FINDINGS Alignment: Normal. Skull base and vertebrae: No acute fracture. No primary bone lesion or focal pathologic process. Soft tissues and spinal canal: No prevertebral fluid or swelling. No visible canal hematoma. Disc levels:  Mild degenerative changes at the C5-6 level. Upper chest: Clear lung apices. Other: Mildly enlarged  thyroid gland containing multiple nodules. The largest nodule on the left measures 2.1 cm in maximum diameter and the largest nodule on the right measures 1.6 cm in maximum diameter. Bilateral carotid artery calcifications. IMPRESSION: 1. No skull fracture or intracranial hemorrhage. 2. No cervical spine fracture or subluxation. 3. Stable mild diffuse cerebral and cerebellar atrophy. 4. Stable marked chronic small vessel white matter ischemic changes in both cerebral hemispheres. 5. Bilateral carotid artery atheromatous calcifications. 6. Mildly enlarged thyroid gland containing multiple nodules. Recommend thyroid US (ref: J Am Coll Radiol. 2015 Feb;12(2): 143-50). Electronically Signed   By: Beckie Salts M.D.   On: 11/03/2020 11:27   DG Chest Portable 1 View  Result Date: 11/03/2020 CLINICAL DATA:  Preop radiograph EXAM: PORTABLE CHEST 1 VIEW COMPARISON:  01/30/2020. FINDINGS: Kyphoscoliosis deformity diminishes exam detail. Aortic tortuosity. No pleural effusion identified. There is mild diffuse increase interstitial markings concerning for pulmonary edema. Bilateral hazy lung opacities are also noted which appear new from previous imaging and is concerning for edema versus infection. IMPRESSION: 1. Findings concerning for CHF. Hazy lung opacities may represent alveolar edema or superimposed infection. Electronically Signed   By: Signa Kell M.D.   On: 11/03/2020 10:03   DG Shoulder Left  Result Date: 11/03/2020 CLINICAL DATA:  Left shoulder pain following a fall. EXAM: LEFT SHOULDER - 2+ VIEW COMPARISON:  None. FINDINGS: Diffuse osteopenia. No fracture or dislocation seen. IMPRESSION: No fracture or dislocation. Electronically Signed   By: Beckie Salts M.D.   On: 11/03/2020 10:05   DG Hip Unilat W or Wo Pelvis 2-3 Views Right  Result Date: 11/03/2020 CLINICAL DATA:  Left leg pain and outward rotation of the leg following a fall. Right hip radiographs were requested and performed. EXAM: DG HIP (WITH  OR WITHOUT PELVIS) 2-3V RIGHT COMPARISON:  Left femur radiographs obtained at the same time. FINDINGS: Left intertrochanteric fracture with varus angulation. No right hip fracture or dislocation seen. Diffuse osteopenia. Lower lumbar spine degenerative changes and scoliosis. IMPRESSION: Left intertrochanteric fracture with varus angulation. Electronically Signed   By: Beckie Salts M.D.   On: 11/03/2020 10:04   DG Femur Min 2 Views Left  Result Date: 11/03/2020 CLINICAL DATA:  Preoperative study. EXAM: LEFT FEMUR 2 VIEWS COMPARISON:  Left hip series 11/03/2020. FINDINGS: Comminuted intertrochanteric left hip fracture. Remainder of the femur is intact. Diffuse osteopenia and degenerative change. Dystrophic calcification number tendon. IMPRESSION: Comminuted intertrochanteric left hip fracture. Remainder of the femur is intact. Electronically Signed   By: Maisie Fus  Register   On: 11/03/2020 10:01    ____________________________________________   PROCEDURES  Procedure(s) performed (including Critical Care):  .1-3 Lead EKG Interpretation Performed  by: Concha Se, MD Authorized by: Concha Se, MD     Interpretation: normal     ECG rate:  70s    ECG rate assessment: normal     Rhythm: sinus rhythm     Ectopy: none     Conduction: normal    .Critical Care Performed by: Concha Se, MD Authorized by: Concha Se, MD   Critical care provider statement:    Critical care time (minutes):  45   Critical care was necessary to treat or prevent imminent or life-threatening deterioration of the following conditions:  Respiratory failure   Critical care was time spent personally by me on the following activities:  Discussions with consultants, evaluation of patient's response to treatment, examination of patient, ordering and performing treatments and interventions, ordering and review of laboratory studies, ordering and review of radiographic studies, pulse oximetry, re-evaluation of patient's  condition, obtaining history from patient or surrogate and review of old charts     ____________________________________________   INITIAL IMPRESSION / ASSESSMENT AND PLAN / ED COURSE    CAEDYN TASSINARI was evaluated in Emergency Department on 11/03/2020 for the symptoms described in the history of present illness. She was evaluated in the context of the global COVID-19 pandemic, which necessitated consideration that the patient might be at risk for infection with the SARS-CoV-2 virus that causes COVID-19. Institutional protocols and algorithms that pertain to the evaluation of patients at risk for COVID-19 are in a state of rapid change based on information released by regulatory bodies including the CDC and federal and state organizations. These policies and algorithms were followed during the patient's care in the ED.    Patient comes in with mechanical fall.  Will get x-rays to evaluate for fracture.  Patient has no chest wall tenderness to suggest intrathoracic or rib fractures.  No abdominal tenderness to suggest abdominal injury.  Given patient does have dementia will get CT head to rule intracranial hemorrhage and CT cervical to rule out cervical fracture.  Will get preop labs.  We will keep patient on the cardiac monitor given we are giving her some IV fentanyl and Zofran to help with pain.  Pt placed on 2L of oxygen. Xray concerning for edema. Added on bnp/trop/procalc. Covid negative.  Will be given 20 of IV Lasix given BNP slightly elevated, Trope was negative and procalcitonin is negative.  Patient has had CT imaging previously showing a thyroid nodule which she has got a thyroid ultrasound for previously.  I attempted to call the orthopedic team Dr. Rosita Kea but I suspect that he is in surgery so I sent him a message.  I discussed with the hospital team Dr. Sedalia Muta for admission.  I did update patient's POA.  ____________________________________________   FINAL CLINICAL IMPRESSION(S)  / ED DIAGNOSES   Final diagnoses:  Acute respiratory failure with hypoxia (HCC)  Closed fracture of left hip, initial encounter (HCC)      MEDICATIONS GIVEN DURING THIS VISIT:  Medications  fentaNYL (SUBLIMAZE) injection 25 mcg (has no administration in time range)  ondansetron (ZOFRAN) injection 4 mg (has no administration in time range)     ED Discharge Orders    None       Note:  This document was prepared using Dragon voice recognition software and may include unintentional dictation errors.   Concha Se, MD 11/03/20 1215

## 2020-11-03 NOTE — ED Notes (Signed)
Admission delay due to concerns related to SBP.  Dr. Sedalia Muta will be notified of VS trend prior to moving patient to inpatient care area.

## 2020-11-03 NOTE — ED Notes (Signed)
Pt to xray

## 2020-11-03 NOTE — Anesthesia Preprocedure Evaluation (Addendum)
Anesthesia Evaluation  Patient identified by MRN, date of birth, ID band Patient awake  General Assessment Comment:Patient drowsy, awakens when talking, responds to questions. Somewhat confused Never had anesthesia  Reviewed: Allergy & Precautions, NPO status , Patient's Chart, lab work & pertinent test results  History of Anesthesia Complications Negative for: history of anesthetic complications  Airway Mallampati: III  TM Distance: >3 FB Neck ROM: Limited  Mouth opening: Limited Mouth Opening Comment: Poor effort with mouth opening Large thoracic kyphosis leading to inability to lay fully supine Dental  (+) Teeth Intact, Dental Advisory Given   Pulmonary neg pulmonary ROS, neg sleep apnea, neg COPD, Patient abstained from smoking.Not current smoker,    Pulmonary exam normal breath sounds clear to auscultation       Cardiovascular Exercise Tolerance: Good METShypertension, (-) CAD and (-) Past MI (-) dysrhythmias  Rhythm:Regular Rate:Normal - Systolic murmurs    Neuro/Psych PSYCHIATRIC DISORDERS Dementia Parkinson diseasenegative neurological ROS     GI/Hepatic Neg liver ROS, GERD  Medicated,(+)     (-) substance abuse  ,   Endo/Other  negative endocrine ROSneg diabetes  Renal/GU negative Renal ROS     Musculoskeletal  (+) Arthritis ,   Abdominal   Peds negative pediatric ROS (+)  Hematology negative hematology ROS (+)   Anesthesia Other Findings Past Medical History: No date: Essential hypertension, benign No date: GERD (gastroesophageal reflux disease) No date: Glaucoma No date: Parkinson disease (HCC)  Reproductive/Obstetrics                             Anesthesia Physical  Anesthesia Plan  ASA: III  Anesthesia Plan: Spinal   Post-op Pain Management:    Induction:   PONV Risk Score and Plan:   Airway Management Planned: Nasal Cannula  Additional Equipment:    Intra-op Plan:   Post-operative Plan:   Informed Consent: I have reviewed the patients History and Physical, chart, labs and discussed the procedure including the risks, benefits and alternatives for the proposed anesthesia with the patient or authorized representative who has indicated his/her understanding and acceptance.     Dental advisory given and Consent reviewed with POA  Plan Discussed with: CRNA and Surgeon  Anesthesia Plan Comments: (Patient denies nausea or vomiting . Patient somewhat confused.  Discussed risks of anesthesia with Lance Morin, patient's daughter.  All risks were discussed including cardiac arrest, CVA, Resp. Difficulty, spinal not working well or conversion to GA.  Daughter understands risks and wishes to proceed.)       Anesthesia Quick Evaluation

## 2020-11-03 NOTE — Transfer of Care (Signed)
Immediate Anesthesia Transfer of Care Note  Patient: Cheryl Cummings  Procedure(s) Performed: INTRAMEDULLARY (IM) NAIL INTERTROCHANTRIC (Left )  Patient Location: PACU  Anesthesia Type:Spinal  Level of Consciousness: drowsy  Airway & Oxygen Therapy: Patient Spontanous Breathing and Patient connected to face mask oxygen  Post-op Assessment: Report given to RN  Post vital signs: stable  Last Vitals:  Vitals Value Taken Time  BP 115/82 11/03/20 1720  Temp    Pulse 85 11/03/20 1722  Resp 9 11/03/20 1722  SpO2 100 % 11/03/20 1722  Vitals shown include unvalidated device data.  Last Pain:  Vitals:   11/03/20 1445  TempSrc:   PainSc: 10-Worst pain ever      Patients Stated Pain Goal: 3 (11/03/20 1445)  Complications: No complications documented.

## 2020-11-03 NOTE — ED Notes (Signed)
Dr. Cox at bedside.  

## 2020-11-03 NOTE — H&P (Signed)
History and Physical   Cheryl Cummings KAJ:[id number] DOB: [birth year] DOA: 11/03/2020  PCP: [name], MD  Patient coming from: Cheryl Cummings  I have personally briefly reviewed patient's old medical records in [facility] EMR.  Chief Concern: Unwitnessed fall  HPI: Cheryl Cummings is a 80 y.o. female with medical history significant for advanced dementia, Parkinson's disease, hypertension, presents to the emergency department from nursing facility for chief concerns of an unwitnessed fall.  Patient was last seen normal at 6 AM on 11/03/2020.  In about 2 hours, patient was found on the ground.  Per report there was no attempts made to ambulate by the patient due to leg pain.  Patient is deaf difficult historian and multiple scans were done in the emergency department.  At bedside she was able to tell me her name, she was not able to tell me her age, current location, current year.  No previous history of seizures   Social history: She lives in a full nursing facility.  Family denies tobacco, EtOH, recreational drug use.  Vaccinations: Patient is vaccinated for covid-19, including booster  ROS: Unable to complete due to advanced dementia  ED Course: Discussed with ED provider, patient requiring hospitalization due to left femur fracture.  Vitals in the emergency department was remarkable for temperature of 97.6, respiration rate of 11, heart rate 78, blood pressure 182/107, SPO2 of 98% on 2 L nasal cannula.  Labs in the emergency department was remarkable for nonfasting blood glucose of 100, BNp 101.  The remaining labs was unremarkable.  EDP gave patient 1 dose Dilaudid 0.5 mg IV.  Per report patient desatted to 86% before pain medication.  EDP ordered a CT of the head without contrast, left shoulder, femur 2 view, tib-fib left, bilateral hip, portable chest x-ray, CT cervical spine and the read is noted in the chart.  Assessment/Plan  Principal Problem:   Femur  fracture, left (HCC) Active Problems:   GERD (gastroesophageal reflux disease)   Generalized osteoarthritis of multiple sites   Parkinson disease (HCC)   Malnutrition of mild degree (HCC)   Dementia (HCC)   Acute hypoxia- CTA of the chest was ordered and negative for pulmonary embolism -CT a of the chest for PE was read as no PE, small right pleural effusion, bilateral lower lobe atelectasis, moderate T5 and severe T6 compression fracture which are new from 2020 but may be chronic, displaced right humeral neck fracture, new from July 2021  Left femur fracture- orthopedic surgery has been consulted - Patient taken to the OR on day of admission for intramedullary nail to the intratrochanteric left - Pain medication: Acetaminophen as needed for mild pain, tramadol 50 mg every 6 hours as needed for moderate pain, morphine 0.5 mg IV every 3 hours as needed for severe pain  Right humoral neck fracture- portable shoulder x-ray was done and found to be fracture deformity involving the right humeral neck, appears subacute to chronic given presence of callus  Parkinson-Resumed home carbidopa levodopa per home dosing instructions  Hypertension- no home antihypertensive at this time - Hydralazine 25 mg p.o. every 8 hours as needed for SBP greater than 170, labetalol 5 mg IV every 2 hours as needed for SBP greater than 170 if patient not able to tolerate p.o. intake  Constipation- resumed milk of magnesia, Colace, Dulcolax suppository -Fleet enema ordered for as needed for severe constipation  Protein-calorie malnutrition dietitian has been consulted  Advanced dementia Chart reviewed.   DVT prophylaxis: Enoxaparin 40  mg subcutaneous every 24 hours Code Status: DNR Diet: N.p.o. in anticipation of OR Family Communication: Updated daughter, Cheryl Cummings over the phone  Disposition Plan: Pending clinical course Consults called: Orthopedic Admission status: Inpatient, MedSurg, no  telemetry  Past Medical History:  Diagnosis Date  . Essential hypertension, benign   . GERD (gastroesophageal reflux disease)   . Glaucoma   . Parkinson disease [facility])    Past Surgical History:  Procedure Laterality Date  . LAPAROSCOPIC APPENDECTOMY N/A 03/07/2020   Procedure: APPENDECTOMY LAPAROSCOPIC;  Surgeon: Henrene Dodge, MD;  Location: [facility] ORS;  Service: General;  Laterality: N/A;  . VAGINAL DELIVERY     twice   Social History:  reports that she has never smoked. She has never used smokeless tobacco. She reports that she does not drink alcohol and does not use drugs.  Allergies  Allergen Reactions  . Nsaids   . Diazepam Other (See Comments)    Unknown  Other reaction(s): Unknown Unknown Other Reaction: OTHER REACTION  . Ibuprofen Swelling  . Meperidine Other (See Comments)    Other Reaction: OTHER REACTION  . Mushroom Extract Complex Nausea And Vomiting  . Other Diarrhea    Sour Cream  . Shellfish Allergy Rash   History reviewed. No pertinent family history. Family history: Family history reviewed and not pertinent  Prior to Admission medications   Medication Sig Start Date End Date Taking? Authorizing Provider  acetaminophen (TYLENOL) 325 MG tablet Take 325-650 mg by mouth every 4 (four) hours as needed for mild pain or fever.    [provider]  alum & mag hydroxide-simeth (MINTOX) 200-200-20 MG/5ML suspension Take 30 mLs by mouth every 6 (six) hours as needed for indigestion or heartburn.    [provider]  bismuth subsalicylate (KAOPECTATE) 262 MG/15ML suspension Take 10 mLs by mouth every 6 (six) hours as needed for indigestion or diarrhea or loose stools.    [provider]  carbamide peroxide (DEBROX) 6.5 % OTIC solution Place 5 drops into both ears 2 (two) times daily as needed (wax buildup).    [provider]  carbidopa-levodopa (SINEMET IR) 25-100 MG tablet Take 1-2 tablets by mouth in the morning, at noon, in the  evening, and at bedtime. 02/19/20   [provider]  cetirizine (ZYRTEC) 10 MG tablet Take 10 mg by mouth daily as needed for allergies.    [provider]  dextromethorphan-guaiFENesin (TUSSIN DM) 10-100 MG/5ML liquid Take 10 mLs by mouth every 4 (four) hours as needed for cough.    [provider]  diphenhydrAMINE (BENADRYL) 25 MG tablet Take 25 mg by mouth every 6 (six) hours as needed for itching or allergies.    [provider]  dorzolamide-timolol (COSOPT) 22.3-6.8 MG/ML ophthalmic solution Place 1 drop into both eyes 2 (two) times daily.    [provider]  magnesium hydroxide (MILK OF MAGNESIA) 400 MG/5ML suspension Take 30 mLs by mouth at bedtime as needed for mild constipation or moderate constipation.    [provider]  Multiple Vitamins-Minerals (MULTIVITAMIN WITH MINERALS) tablet Take 1 tablet by mouth daily.    [provider]  nitroGLYCERIN (NITROSTAT) 0.4 MG SL tablet Place 0.4 mg under the tongue as needed. 01/30/20   [provider]  ondansetron (ZOFRAN) 4 MG tablet Take 4 mg by mouth every 8 (eight) hours as needed for nausea or vomiting.    [provider]  oxyCODONE (OXY IR/ROXICODONE) 5 MG immediate release tablet Take 1 tablet (5 mg total) by  mouth every 6 (six) hours as needed for severe pain or breakthrough pain. 03/09/20   Donovan Kail, PA-C  TRAVATAN Z 0.004 % SOLN ophthalmic solution Place 1 drop into both eyes at bedtime.    [provider]  Vitamin D, Ergocalciferol, (DRISDOL) 50000 units CAPS capsule Take 1 capsule (50,000 Units total) by mouth every 30 (thirty) days. Patient taking differently: Take 50,000 Units by mouth. Every 28 days 03/14/17   [name], MD   Physical Exam: Vitals:   11/03/20 1750 11/03/20 1805 11/03/20 1845 11/03/20 1938  BP: 113/79 114/87 (!) 135/96 113/84  Pulse: 83 85 87 85  Resp: Temp:  (!) 97.4 F (36.3 C) 97.9 F (36.6 C) 98.5  F (36.9 C)  TempSrc:      SpO2: 94% 95% 100% 100%  Weight:      Height:       Constitutional: appears age-appropriate, frail, cachectic, NAD, calm, comfortable Eyes: PERRL, lids and conjunctivae normal ENMT: Mucous membranes are moist. Posterior pharynx clear of any exudate or lesions. Age-appropriate dentition. Hearing appropriate Neck: normal, supple, no masses, no thyromegaly Respiratory: clear to auscultation bilaterally, no wheezing, no crackles. Normal respiratory effort. No accessory muscle use.  Cardiovascular: Regular rate and rhythm, no murmurs / rubs / gallops. No extremity edema. 2+ pedal pulses. No carotid bruits.  Abdomen: no tenderness, no masses palpated, no hepatosplenomegaly. Bowel sounds positive.  Musculoskeletal: Generalized bilateral lower extremity atrophy.  Left leg pain.   Skin: no rashes, lesions, ulcers. No induration.  Multiple bruises present on admission Neurologic: Sensation intact. Strength 5/5 in all 4.  Psychiatric: Normal judgment and insight. Alert and oriented x name.  Patient was not able to tell me her age, location, current calendar year. Normal mood.   EKG: independently reviewed, showing sinus rhythm with rate of 82, QTc 463, LVH  Chest x-ray on Admission: I personally reviewed and I agree with radiologist reading as below.  DG Tibia/Fibula Left  Result Date: 11/03/2020 CLINICAL DATA:  Left leg pain following a fall. EXAM: LEFT TIBIA AND FIBULA - 2 VIEW COMPARISON:  Left femur radiographs obtained the same time. FINDINGS: Diffuse osteopenia. No fracture or dislocation seen. IMPRESSION: No fracture or dislocation. Electronically Signed   By: Beckie Salts M.D.   On: 11/03/2020 10:05   CT Head Wo Contrast  Result Date: 11/03/2020 CLINICAL DATA:  Unwitnessed fall this morning. EXAM: CT HEAD WITHOUT CONTRAST CT CERVICAL SPINE WITHOUT CONTRAST TECHNIQUE: Multidetector CT imaging of the head and cervical spine was performed following the standard  protocol without intravenous contrast. Multiplanar CT image reconstructions of the cervical spine were also generated. COMPARISON:  Head CT dated 07/14/2020 FINDINGS: CT HEAD FINDINGS Brain: Stable mildly enlarged ventricles and cortical sulci. Stable marked patchy white matter low density in both cerebral hemispheres. No intracranial hemorrhage, mass lesion or CT evidence of acute infarction. Vascular: No hyperdense vessel or unexpected calcification. Skull: Normal. Negative for fracture or focal lesion. Sinuses/Orbits: Unremarkable. Other: None. In the increasing in each CT CERVICAL SPINE FINDINGS Alignment: Normal. Skull base and vertebrae: No acute fracture. No primary bone lesion or focal pathologic process. Soft tissues and spinal canal: No prevertebral fluid or swelling. No visible canal hematoma. Disc levels:  Mild degenerative changes at the C5-6 level. Upper chest: Clear lung apices. Other: Mildly enlarged thyroid gland containing multiple nodules. The largest nodule on the left measures 2.1 cm in maximum diameter and the largest nodule on the right measures 1.6 cm  in maximum diameter. Bilateral carotid artery calcifications. IMPRESSION: 1. No skull fracture or intracranial hemorrhage. 2. No cervical spine fracture or subluxation. 3. Stable mild diffuse cerebral and cerebellar atrophy. 4. Stable marked chronic small vessel white matter ischemic changes in both cerebral hemispheres. 5. Bilateral carotid artery atheromatous calcifications. 6. Mildly enlarged thyroid gland containing multiple nodules. Recommend thyroid US (ref: J Am Coll Radiol. 2015 Feb;12(2): 143-50). Electronically Signed   By: Beckie SaltsSteven  Reid M.D.   On: 11/03/2020 11:27   CT ANGIO CHEST PE W OR WO CONTRAST  Result Date: 11/03/2020 CLINICAL DATA:  Hypoxia.  Unwitnessed fall. EXAM: CT ANGIOGRAPHY CHEST WITH CONTRAST TECHNIQUE: Multidetector CT imaging of the chest was performed using the standard protocol during bolus administration of  intravenous contrast. Multiplanar CT image reconstructions and MIPs were obtained to evaluate the vascular anatomy. CONTRAST:  50mL OMNIPAQUE IOHEXOL 350 MG/ML SOLN COMPARISON:  Chest CT 07/24/2018. PET-CT 08/08/2018. Chest radiographs 11/03/2020 and 01/30/2020. FINDINGS: Cardiovascular: Pulmonary arterial opacification is adequate to the segmental level without evidence of emboli. The thoracic aorta is normal in caliber. The heart is mildly enlarged. There is trace pericardial fluid. Mediastinum/Nodes: No enlarged axillary, mediastinal, or hilar lymph nodes. Nondistended esophagus. Enlarged thyroid with underlying nodules (this has been evaluated on previous imaging including a 08/08/2018 ultrasound). Lungs/Pleura: Small right pleural effusion. Two adjacent nodules in the right middle lobe measure 12 x 10 mm in 17 x 13 mm, slightly smaller than on the prior chest CT and without abnormal FDG uptake on the prior PET compatible with benignity. Dependent atelectasis is noted in both lower lobes, and there is also additional atelectasis or scarring in both lung bases. Upper Abdomen: No acute abnormality. Musculoskeletal: Mild T4, severe T7, moderate T9, mild T11, and mild T12 compression fractures are all unchanged from the prior chest CT. Moderate T5 and severe T6 compression fractures are new from the prior chest CT, however no visible unhealed fracture line or paravertebral hematoma is seen to clearly indicate acute fractures and these may also be chronic. There is markedly increased thoracic kyphosis, and there is moderate to severe thoracolumbar levoscoliosis. A displaced right humeral neck fracture is noted on the topogram and is new from 01/30/2020 chest radiographs but may also be chronic. Review of the MIP images confirms the above findings. IMPRESSION: 1. No evidence of pulmonary emboli. 2. Small right pleural effusion. 3. Bilateral lower lobe atelectasis. 4. Moderate T5 and severe T6 compression fractures  which are new from 2020 but may be chronic. If the patient has back pain concerning for an acute fracture in this region, consider thoracic spine MRI for further evaluation. 5. Displaced right humeral neck fracture, new from 01/2020 but incompletely evaluated as this is only visible on the topogram. Electronically Signed   By: Sebastian AcheAllen  Grady M.D.   On: 11/03/2020 14:10   CT Cervical Spine Wo Contrast  Result Date: 11/03/2020 CLINICAL DATA:  Unwitnessed fall this morning. EXAM: CT HEAD WITHOUT CONTRAST CT CERVICAL SPINE WITHOUT CONTRAST TECHNIQUE: Multidetector CT imaging of the head and cervical spine was performed following the standard protocol without intravenous contrast. Multiplanar CT image reconstructions of the cervical spine were also generated. COMPARISON:  Head CT dated 07/14/2020 FINDINGS: CT HEAD FINDINGS Brain: Stable mildly enlarged ventricles and cortical sulci. Stable marked patchy white matter low density in both cerebral hemispheres. No intracranial hemorrhage, mass lesion or CT evidence of acute infarction. Vascular: No hyperdense vessel or unexpected calcification. Skull: Normal. Negative for fracture or focal lesion. Sinuses/Orbits: Unremarkable. Other:  None. In the increasing in each CT CERVICAL SPINE FINDINGS Alignment: Normal. Skull base and vertebrae: No acute fracture. No primary bone lesion or focal pathologic process. Soft tissues and spinal canal: No prevertebral fluid or swelling. No visible canal hematoma. Disc levels:  Mild degenerative changes at the C5-6 level. Upper chest: Clear lung apices. Other: Mildly enlarged thyroid gland containing multiple nodules. The largest nodule on the left measures 2.1 cm in maximum diameter and the largest nodule on the right measures 1.6 cm in maximum diameter. Bilateral carotid artery calcifications. IMPRESSION: 1. No skull fracture or intracranial hemorrhage. 2. No cervical spine fracture or subluxation. 3. Stable mild diffuse cerebral and  cerebellar atrophy. 4. Stable marked chronic small vessel white matter ischemic changes in both cerebral hemispheres. 5. Bilateral carotid artery atheromatous calcifications. 6. Mildly enlarged thyroid gland containing multiple nodules. Recommend thyroid US (ref: J Am Coll Radiol. 2015 Feb;12(2): 143-50). Electronically Signed   By: Beckie Salts M.D.   On: 11/03/2020 11:27   DG Chest Portable 1 View  Result Date: 11/03/2020 CLINICAL DATA:  Preop radiograph EXAM: PORTABLE CHEST 1 VIEW COMPARISON:  01/30/2020. FINDINGS: Kyphoscoliosis deformity diminishes exam detail. Aortic tortuosity. No pleural effusion identified. There is mild diffuse increase interstitial markings concerning for pulmonary edema. Bilateral hazy lung opacities are also noted which appear new from previous imaging and is concerning for edema versus infection. IMPRESSION: 1. Findings concerning for CHF. Hazy lung opacities may represent alveolar edema or superimposed infection. Electronically Signed   By: Signa Kell M.D.   On: 11/03/2020 10:03   DG Shoulder Left  Result Date: 11/03/2020 CLINICAL DATA:  Left shoulder pain following a fall. EXAM: LEFT SHOULDER - 2+ VIEW COMPARISON:  None. FINDINGS: Diffuse osteopenia. No fracture or dislocation seen. IMPRESSION: No fracture or dislocation. Electronically Signed   By: Beckie Salts M.D.   On: 11/03/2020 10:05   DG Shoulder Right Port  Result Date: 11/03/2020 CLINICAL DATA:  Follow-up right humerus fracture EXAM: PORTABLE RIGHT SHOULDER COMPARISON:  CT 11/03/2020, 01/30/2020, 11/03/2020 FINDINGS: Fracture deformity involving the right humeral neck with moderate valgus angulation of the shaft. Callus formation is present consistent with subacute to chronic fracture. No dislocation IMPRESSION: Fracture deformity involving the right humeral neck, appears subacute to chronic given presence of callus. Electronically Signed   By: Jasmine Pang M.D.   On: 11/03/2020 18:41   DG HIP OPERATIVE  UNILAT WITH PELVIS LEFT  Result Date: 11/03/2020 CLINICAL DATA:  Hip fracture EXAM: OPERATIVE left HIP (WITH PELVIS IF PERFORMED) 4 VIEWS TECHNIQUE: Fluoroscopic spot image(s) were submitted for interpretation post-operatively. COMPARISON:  11/03/2020 FINDINGS: Four low resolution intraoperative spot views of the left femur/hip. Total fluoroscopy time was 1 minutes 7 seconds. The images demonstrate intramedullary rod and distal screw fixation of the left femur for intertrochanteric fracture. There is anatomic alignment. IMPRESSION: Intraoperative fluoroscopic assistance provided during surgical fixation of proximal left femur fracture Electronically Signed   By: Jasmine Pang M.D.   On: 11/03/2020 18:38   DG Hip Unilat W or Wo Pelvis 2-3 Views Right  Result Date: 11/03/2020 CLINICAL DATA:  Left leg pain and outward rotation of the leg following a fall. Right hip radiographs were requested and performed. EXAM: DG HIP (WITH OR WITHOUT PELVIS) 2-3V RIGHT COMPARISON:  Left femur radiographs obtained at the same time. FINDINGS: Left intertrochanteric fracture with varus angulation. No right hip fracture or dislocation seen. Diffuse osteopenia. Lower lumbar spine degenerative changes and scoliosis. IMPRESSION: Left intertrochanteric fracture with varus angulation. Electronically  Signed   By: Beckie Salts M.D.   On: 11/03/2020 10:04   DG Femur Min 2 Views Left  Result Date: 11/03/2020 CLINICAL DATA:  Preoperative study. EXAM: LEFT FEMUR 2 VIEWS COMPARISON:  Left hip series 11/03/2020. FINDINGS: Comminuted intertrochanteric left hip fracture. Remainder of the femur is intact. Diffuse osteopenia and degenerative change. Dystrophic calcification number tendon. IMPRESSION: Comminuted intertrochanteric left hip fracture. Remainder of the femur is intact. Electronically Signed   By: Maisie Fus  Register   On: 11/03/2020 10:01   Labs on Admission: I have personally reviewed following labs  CBC: Recent Labs  Lab  11/03/20 0815  WBC 6.6  NEUTROABS 4.3  HGB 14.4  HCT 41.6  MCV 91.4  PLT 191   Basic Metabolic Panel: Recent Labs  Lab 11/03/20 0815  NA 138  K 4.0  CL 104  CO2 25  GLUCOSE 100*  BUN 22  CREATININE 0.53  CALCIUM 9.0   GFR: Estimated Creatinine Clearance: 52.3 mL/min (by C-G formula based on SCr of 0.53 mg/dL).  Liver Function Tests: Recent Labs  Lab 11/03/20 0815  AST 20  ALT 16  ALKPHOS 91  BILITOT 1.0  PROT 7.1  ALBUMIN 4.1   Coagulation Profile: Recent Labs  Lab 11/03/20 0815  INR 1.0   Urine analysis:    Component Value Date/Time   COLORURINE YELLOW (A) 12/21/2018 0836   APPEARANCEUR CLOUDY (A) 12/21/2018 0836   APPEARANCEUR CLOUDY 03/12/2014 1115   LABSPEC 1.011 12/21/2018 0836   LABSPEC 1.020 03/12/2014 1115   PHURINE 7.0 12/21/2018 0836   GLUCOSEU NEGATIVE 12/21/2018 0836   GLUCOSEU NEGATIVE 03/12/2014 1115   HGBUR NEGATIVE 12/21/2018 0836   BILIRUBINUR NEGATIVE 12/21/2018 0836   BILIRUBINUR NEGATIVE 03/12/2014 1115   KETONESUR NEGATIVE 12/21/2018 0836   PROTEINUR NEGATIVE 12/21/2018 0836   NITRITE NEGATIVE 12/21/2018 0836   LEUKOCYTESUR TRACE (A) 12/21/2018 0836   LEUKOCYTESUR 1+ 03/12/2014 1115   Paul Trettin N Arlene Brickel D.O. [facility]  If 7PM-7AM, please contact overnight-coverage provider If 7AM-7PM, please contact day coverage provider [email or web address]  11/03/2020, 11:17 PM

## 2020-11-03 NOTE — ED Notes (Signed)
Informed RN bed assigned 1230

## 2020-11-03 NOTE — Progress Notes (Signed)
No charge progress note  CTA was negative for PE and/or pneumonia. VSSAF. She is optimized from medicine standpoint for orthopedic intervention.

## 2020-11-03 NOTE — Consult Note (Addendum)
ORTHOPAEDIC CONSULTATION  REQUESTING PHYSICIAN: Cox, Amy N, DO  Chief Complaint:   Left hip pain.  History of Present Illness: Cheryl Cummings is a 80 y.o. female with multiple medical problems including hypertension, gastroesophageal reflux disease, dementia, and Parkinson's.  The patient lives in an assisted living facility at [facility].  The patient was in her usual state of health until she apparently lost her balance and sustained an unwitnessed fall this morning.  The patient was brought to the emergency room where x-rays of her left femur showed evidence of a displaced intertrochanteric left hip fracture.  The patient does not believe that she struck her head or loss consciousness.  She also denies any chest pain, shortness of breath, dizziness, lightheadedness, or other symptoms that may have precipitated her fall.  The patient did have a desaturation event in the emergency room so a CT angiogram was obtained which showed no evidence for pulmonary embolus, pneumonia, or significant pulmonary edema.  Therefore, she has been cleared for surgery by the hospitalist from a pulmonary standpoint.  However, the CT angiogram also suggests a mildly displaced surgical neck fracture of the right proximal humerus of unknown certain age.  Past Medical History:  Diagnosis Date  . Essential hypertension, benign   . GERD (gastroesophageal reflux disease)   . Glaucoma   . Parkinson disease [facility])    Past Surgical History:  Procedure Laterality Date  . LAPAROSCOPIC APPENDECTOMY N/A 03/07/2020   Procedure: APPENDECTOMY LAPAROSCOPIC;  Surgeon: Henrene Dodge, MD;  Location: [facility] ORS;  Service: General;  Laterality: N/A;  . VAGINAL DELIVERY     twice   Social History   Socioeconomic History  . Marital status: Widowed    Spouse name: Not on file  . Number of children: 2  . Years of education: Not on file  . Highest education level: Not  on file  Occupational History  . Occupation: Production designer, theatre/television/film    Comment: [place] [employer]  Tobacco Use  . Smoking status: Never Smoker  . Smokeless tobacco: Never Used  Substance and Sexual Activity  . Alcohol use: No  . Drug use: No  . Sexual activity: Not Currently  Other Topics Concern  . Not on file  Social History Narrative   No living will   Would want daughter and son to make health care decisions for her   Would accept resuscitation--but no prolonged ventilation   Would not want extended tube feeds--would accept short term to determine prognosis   Social Determinants of Health   Financial Resource Strain: Not on file  Food Insecurity: Not on file  Transportation Needs: Not on file  Physical Activity: Not on file  Stress: Not on file  Social Connections: Not on file   No family history on file. Allergies  Allergen Reactions  . Nsaids Anaphylaxis  . Diazepam Other (See Comments)    Unknown  Other reaction(s): Unknown Unknown Other Reaction: OTHER REACTION  . Ibuprofen Swelling  . Meperidine Other (See Comments)    Other Reaction: OTHER REACTION  . Mushroom Extract Complex Nausea And Vomiting  . Other Diarrhea    Sour Cream  . Shellfish Allergy Rash   Prior to Admission medications   Medication Sig Start Date End Date Taking? Authorizing Provider  acetaminophen (TYLENOL) 325 MG tablet Take 325-650 mg by mouth every 4 (four) hours as needed for mild pain or fever.    [provider]  alum & mag hydroxide-simeth (MINTOX) 200-200-20 MG/5ML suspension Take 30 mLs by mouth  every 6 (six) hours as needed for indigestion or heartburn.    [provider]  bismuth subsalicylate (KAOPECTATE) 262 MG/15ML suspension Take 10 mLs by mouth every 6 (six) hours as needed for indigestion or diarrhea or loose stools.    [provider]  carbamide peroxide (DEBROX) 6.5 % OTIC solution Place 5 drops into both ears 2 (two) times daily as needed (wax  buildup).    [provider]  carbidopa-levodopa (SINEMET IR) 25-100 MG tablet Take 1-2 tablets by mouth in the morning, at noon, in the evening, and at bedtime. 02/19/20   [provider]  cetirizine (ZYRTEC) 10 MG tablet Take 10 mg by mouth daily as needed for allergies.    [provider]  dextromethorphan-guaiFENesin (TUSSIN DM) 10-100 MG/5ML liquid Take 10 mLs by mouth every 4 (four) hours as needed for cough.    [provider]  diphenhydrAMINE (BENADRYL) 25 MG tablet Take 25 mg by mouth every 6 (six) hours as needed for itching or allergies.    [provider]  dorzolamide-timolol (COSOPT) 22.3-6.8 MG/ML ophthalmic solution Place 1 drop into both eyes 2 (two) times daily.    [provider]  magnesium hydroxide (MILK OF MAGNESIA) 400 MG/5ML suspension Take 30 mLs by mouth at bedtime as needed for mild constipation or moderate constipation.    [provider]  Multiple Vitamins-Minerals (MULTIVITAMIN WITH MINERALS) tablet Take 1 tablet by mouth daily.    [provider]  nitroGLYCERIN (NITROSTAT) 0.4 MG SL tablet Place 0.4 mg under the tongue as needed. 01/30/20   [provider]  ondansetron (ZOFRAN) 4 MG tablet Take 4 mg by mouth every 8 (eight) hours as needed for nausea or vomiting.    [provider]  oxyCODONE (OXY IR/ROXICODONE) 5 MG immediate release tablet Take 1 tablet (5 mg total) by mouth every 6 (six) hours as needed for severe pain or breakthrough pain. 03/09/20   Donovan Kail, PA-C  TRAVATAN Z 0.004 % SOLN ophthalmic solution Place 1 drop into both eyes at bedtime.    [provider]  Vitamin D, Ergocalciferol, (DRISDOL) 50000 units CAPS capsule Take 1 capsule (50,000 Units total) by mouth every 30 (thirty) days. Patient taking differently: Take 50,000 Units by mouth. Every 28 days 03/14/17   [name], MD   DG Tibia/Fibula Left  Result Date: 11/03/2020 CLINICAL DATA:  Left  leg pain following a fall. EXAM: LEFT TIBIA AND FIBULA - 2 VIEW COMPARISON:  Left femur radiographs obtained the same time. FINDINGS: Diffuse osteopenia. No fracture or dislocation seen. IMPRESSION: No fracture or dislocation. Electronically Signed   By: Beckie Salts M.D.   On: 11/03/2020 10:05   CT Head Wo Contrast  Result Date: 11/03/2020 CLINICAL DATA:  Unwitnessed fall this morning. EXAM: CT HEAD WITHOUT CONTRAST CT CERVICAL SPINE WITHOUT CONTRAST TECHNIQUE: Multidetector CT imaging of the head and cervical spine was performed following the standard protocol without intravenous contrast. Multiplanar CT image reconstructions of the cervical spine were also generated. COMPARISON:  Head CT dated 07/14/2020 FINDINGS: CT HEAD FINDINGS Brain: Stable mildly enlarged ventricles and cortical sulci. Stable marked patchy white matter low density in both cerebral hemispheres. No intracranial hemorrhage, mass lesion or CT evidence of acute infarction. Vascular: No hyperdense vessel or unexpected calcification. Skull: Normal. Negative for fracture or focal lesion. Sinuses/Orbits: Unremarkable. Other: None. In the increasing in each CT CERVICAL SPINE FINDINGS Alignment: Normal. Skull base and vertebrae: No acute fracture. No primary bone lesion or focal  pathologic process. Soft tissues and spinal canal: No prevertebral fluid or swelling. No visible canal hematoma. Disc levels:  Mild degenerative changes at the C5-6 level. Upper chest: Clear lung apices. Other: Mildly enlarged thyroid gland containing multiple nodules. The largest nodule on the left measures 2.1 cm in maximum diameter and the largest nodule on the right measures 1.6 cm in maximum diameter. Bilateral carotid artery calcifications. IMPRESSION: 1. No skull fracture or intracranial hemorrhage. 2. No cervical spine fracture or subluxation. 3. Stable mild diffuse cerebral and cerebellar atrophy. 4. Stable marked chronic small vessel white matter ischemic changes  in both cerebral hemispheres. 5. Bilateral carotid artery atheromatous calcifications. 6. Mildly enlarged thyroid gland containing multiple nodules. Recommend thyroid US (ref: J Am Coll Radiol. 2015 Feb;12(2): 143-50). Electronically Signed   By: Beckie Salts M.D.   On: 11/03/2020 11:27   CT ANGIO CHEST PE W OR WO CONTRAST  Result Date: 11/03/2020 CLINICAL DATA:  Hypoxia.  Unwitnessed fall. EXAM: CT ANGIOGRAPHY CHEST WITH CONTRAST TECHNIQUE: Multidetector CT imaging of the chest was performed using the standard protocol during bolus administration of intravenous contrast. Multiplanar CT image reconstructions and MIPs were obtained to evaluate the vascular anatomy. CONTRAST:  63mL OMNIPAQUE IOHEXOL 350 MG/ML SOLN COMPARISON:  Chest CT 07/24/2018. PET-CT 08/08/2018. Chest radiographs 11/03/2020 and 01/30/2020. FINDINGS: Cardiovascular: Pulmonary arterial opacification is adequate to the segmental level without evidence of emboli. The thoracic aorta is normal in caliber. The heart is mildly enlarged. There is trace pericardial fluid. Mediastinum/Nodes: No enlarged axillary, mediastinal, or hilar lymph nodes. Nondistended esophagus. Enlarged thyroid with underlying nodules (this has been evaluated on previous imaging including a 08/08/2018 ultrasound). Lungs/Pleura: Small right pleural effusion. Two adjacent nodules in the right middle lobe measure 12 x 10 mm in 17 x 13 mm, slightly smaller than on the prior chest CT and without abnormal FDG uptake on the prior PET compatible with benignity. Dependent atelectasis is noted in both lower lobes, and there is also additional atelectasis or scarring in both lung bases. Upper Abdomen: No acute abnormality. Musculoskeletal: Mild T4, severe T7, moderate T9, mild T11, and mild T12 compression fractures are all unchanged from the prior chest CT. Moderate T5 and severe T6 compression fractures are new from the prior chest CT, however no visible unhealed fracture line or  paravertebral hematoma is seen to clearly indicate acute fractures and these may also be chronic. There is markedly increased thoracic kyphosis, and there is moderate to severe thoracolumbar levoscoliosis. A displaced right humeral neck fracture is noted on the topogram and is new from 01/30/2020 chest radiographs but may also be chronic. Review of the MIP images confirms the above findings. IMPRESSION: 1. No evidence of pulmonary emboli. 2. Small right pleural effusion. 3. Bilateral lower lobe atelectasis. 4. Moderate T5 and severe T6 compression fractures which are new from 2020 but may be chronic. If the patient has back pain concerning for an acute fracture in this region, consider thoracic spine MRI for further evaluation. 5. Displaced right humeral neck fracture, new from 01/2020 but incompletely evaluated as this is only visible on the topogram. Electronically Signed   By: Sebastian Ache M.D.   On: 11/03/2020 14:10   CT Cervical Spine Wo Contrast  Result Date: 11/03/2020 CLINICAL DATA:  Unwitnessed fall this morning. EXAM: CT HEAD WITHOUT CONTRAST CT CERVICAL SPINE WITHOUT CONTRAST TECHNIQUE: Multidetector CT imaging of the head and cervical spine was performed following the standard protocol without intravenous contrast. Multiplanar CT image reconstructions of the cervical spine  were also generated. COMPARISON:  Head CT dated 07/14/2020 FINDINGS: CT HEAD FINDINGS Brain: Stable mildly enlarged ventricles and cortical sulci. Stable marked patchy white matter low density in both cerebral hemispheres. No intracranial hemorrhage, mass lesion or CT evidence of acute infarction. Vascular: No hyperdense vessel or unexpected calcification. Skull: Normal. Negative for fracture or focal lesion. Sinuses/Orbits: Unremarkable. Other: None. In the increasing in each CT CERVICAL SPINE FINDINGS Alignment: Normal. Skull base and vertebrae: No acute fracture. No primary bone lesion or focal pathologic process. Soft tissues  and spinal canal: No prevertebral fluid or swelling. No visible canal hematoma. Disc levels:  Mild degenerative changes at the C5-6 level. Upper chest: Clear lung apices. Other: Mildly enlarged thyroid gland containing multiple nodules. The largest nodule on the left measures 2.1 cm in maximum diameter and the largest nodule on the right measures 1.6 cm in maximum diameter. Bilateral carotid artery calcifications. IMPRESSION: 1. No skull fracture or intracranial hemorrhage. 2. No cervical spine fracture or subluxation. 3. Stable mild diffuse cerebral and cerebellar atrophy. 4. Stable marked chronic small vessel white matter ischemic changes in both cerebral hemispheres. 5. Bilateral carotid artery atheromatous calcifications. 6. Mildly enlarged thyroid gland containing multiple nodules. Recommend thyroid US (ref: J Am Coll Radiol. 2015 Feb;12(2): 143-50). Electronically Signed   By: Beckie Salts M.D.   On: 11/03/2020 11:27   DG Chest Portable 1 View  Result Date: 11/03/2020 CLINICAL DATA:  Preop radiograph EXAM: PORTABLE CHEST 1 VIEW COMPARISON:  01/30/2020. FINDINGS: Kyphoscoliosis deformity diminishes exam detail. Aortic tortuosity. No pleural effusion identified. There is mild diffuse increase interstitial markings concerning for pulmonary edema. Bilateral hazy lung opacities are also noted which appear new from previous imaging and is concerning for edema versus infection. IMPRESSION: 1. Findings concerning for CHF. Hazy lung opacities may represent alveolar edema or superimposed infection. Electronically Signed   By: Signa Kell M.D.   On: 11/03/2020 10:03   DG Shoulder Left  Result Date: 11/03/2020 CLINICAL DATA:  Left shoulder pain following a fall. EXAM: LEFT SHOULDER - 2+ VIEW COMPARISON:  None. FINDINGS: Diffuse osteopenia. No fracture or dislocation seen. IMPRESSION: No fracture or dislocation. Electronically Signed   By: Beckie Salts M.D.   On: 11/03/2020 10:05   DG Hip Unilat W or Wo Pelvis  2-3 Views Right  Result Date: 11/03/2020 CLINICAL DATA:  Left leg pain and outward rotation of the leg following a fall. Right hip radiographs were requested and performed. EXAM: DG HIP (WITH OR WITHOUT PELVIS) 2-3V RIGHT COMPARISON:  Left femur radiographs obtained at the same time. FINDINGS: Left intertrochanteric fracture with varus angulation. No right hip fracture or dislocation seen. Diffuse osteopenia. Lower lumbar spine degenerative changes and scoliosis. IMPRESSION: Left intertrochanteric fracture with varus angulation. Electronically Signed   By: Beckie Salts M.D.   On: 11/03/2020 10:04   DG Femur Min 2 Views Left  Result Date: 11/03/2020 CLINICAL DATA:  Preoperative study. EXAM: LEFT FEMUR 2 VIEWS COMPARISON:  Left hip series 11/03/2020. FINDINGS: Comminuted intertrochanteric left hip fracture. Remainder of the femur is intact. Diffuse osteopenia and degenerative change. Dystrophic calcification number tendon. IMPRESSION: Comminuted intertrochanteric left hip fracture. Remainder of the femur is intact. Electronically Signed   By: Maisie Fus  Register   On: 11/03/2020 10:01    Positive ROS: All other systems have been reviewed and were otherwise negative with the exception of those mentioned in the HPI and as above.  Physical Exam: General:  Alert, no acute distress Psychiatric:  Patient is not competent  for consent, but exhibits normal mood and affect  Cardiovascular:  No pedal edema Respiratory:  No wheezing, non-labored breathing GI:  Abdomen is soft and non-tender Skin:  No lesions in the area of chief complaint Neurologic:  Sensation intact distally Lymphatic:  No axillary or cervical lymphadenopathy  Orthopedic Exam:  Orthopedic examination is limited to the left hip and lower extremity.  The left lower extremity is somewhat shortened and internally rotated as compared to the right.  Skin inspection around the left hip is unremarkable.  No swelling, erythema, ecchymosis, abrasions,  or other skin abnormalities are identified.  She has some mild-moderate tenderness to palpation of the lateral aspect of the left hip.  She has more severe pain with any attempted active or passive motion of the hip.  She is neurovascularly intact to the left lower extremity and foot.  X-rays:  Recent x-rays of the left femur, including the left hip, are available for review and have been reviewed by myself.  These films demonstrate a varus displaced comminuted intertrochanteric fracture of the left hip.  No significant degenerative changes of the hip joint are noted.  No lytic lesions or other acute bony abnormalities are identified.  Assessment: 1. Closed displaced intertrochanteric fracture, left hip. 2.  Possible right proximal humerus fracture of indeterminate age.  Plan: The treatment options for her left hip fracture, including both surgical and nonsurgical choices, have been discussed in detail with the patient and her family, including the patient's daughter, Lance Morinamra Johnson, who is the patient's power of attorney.  The patient and her family would like to proceed with surgical intervention to include an intramedullary nailing of the displaced intertrochanteric left hip fracture. The risks (including bleeding, infection, nerve and/or blood vessel injury, persistent or recurrent pain, loosening or failure of the components, malunion and/or nonunion, leg length inequality, need for further surgery, blood clots, strokes, heart attacks or arrhythmias, pneumonia, etc.) and benefits of the surgical procedure were discussed. The patient's family states their understanding and agree to proceed. A formal written consent has been obtained by telephone from the patient's daughter.  At this point, the right proximal humerus fracture will be treated in a shoulder immobilizer until more thorough further evaluation can be performed.  Thank you for asking me to participate in the care of this most pleasant yet  unfortunate woman.  I will be happy to follow her with you.   Maryagnes AmosJ. Jeffrey Myrakle Wingler, MD  Beeper #:  [phone]  11/03/2020 2:33 PM

## 2020-11-03 NOTE — Plan of Care (Signed)

## 2020-11-03 NOTE — ED Notes (Signed)
Spoke with OR nurse. She will communicate with floor to figure out if going straight to floor or straight to OR.

## 2020-11-03 NOTE — Op Note (Signed)
11/03/2020  5:22 PM  Patient:   Cheryl Cummings  Pre-Op Diagnosis:   Closed displaced intertrochanteric fracture, left hip.  Post-Op Diagnosis:   Same  Procedure:   Reduction and internal fixation of displaced intertrochanteric left hip fracture with Biomet Affixis TFN nail.  Surgeon:   Maryagnes Amos, MD  Assistant:   None  Anesthesia:   Spinal  Findings:   As above  Complications:   None  EBL:   75 cc  Fluids:   800 cc crystalloid  UOP:   None  TT:   None  Drains:   None  Closure:   Staples  Implants:   Biomet Affixis 11 x 360 mm TFN with a 95 mm lag screw and a 42 mm distal interlocking screw  Brief Clinical Note:   The patient is an 80 year old female who sustained the above-noted injury earlier this morning when she apparently fell while at her assisted living facility. She was brought to the emergency room where x-rays demonstrated the above-noted injury. The patient has been cleared medically and presents at this time for reduction and internal fixation of the displaced intertrochanteric left hip fracture.  Procedure:   The patient was brought into the operating room. After adequate spinal anesthesia was obtained, the patient was lain in the supine position on the fracture table. The uninjured leg was placed in a flexed and abducted position while the injured lower extremity was placed in longitudinal traction. The fracture was reduced using longitudinal traction and internal rotation. The adequacy of reduction was verified fluoroscopically in AP and lateral projections and found to be near anatomic. The lateral aspects of the left hip and thigh were prepped with ChloraPrep solution before being draped sterilely. Preoperative antibiotics were administered. A timeout was performed to verify the appropriate surgical site.   The greater trochanter was identified fluoroscopically and an approximately 3 cm incision made about 2-3 fingerbreadths above the tip of the  greater trochanter. The incision was carried down through the subcutaneous tissues to expose the gluteal fascia. This was split the length of the incision, providing access to the tip of the trochanter. Under fluoroscopic guidance, a guidewire was drilled through the tip of the trochanter into the proximal metaphysis to the level of the lesser trochanter. After verifying its position fluoroscopically in AP and lateral projections, it was overreamed with the initial reamer to the depth of the lesser trochanter. A guidewire was passed down through the femoral canal to the supracondylar region. The adequacy of guidewire position was verified fluoroscopically in AP and lateral projections before the length of the guidewire within the canal was measured and found to be 395 mm. Therefore, a 360 mm length nail was selected. The guidewire was overreamed sequentially using the flexible reamers, beginning with a 9.5 mm reamer and progressing to a 12.5 mm reamer. This provided good cortical chatter. The 11 x 360 mm Biomet Affixis TFN rod was selected and advanced to the appropriate depth, as verified fluoroscopically.   The guide system for the lag screw was positioned and advanced through an approximately 2 cm stab incision over the lateral aspect of the proximal femur. The guidewire was drilled up through the trochanteric femoral nail and into the femoral neck to rest within 5 mm of subchondral bone. After verifying its position in the femoral neck and head in both AP and lateral projections, the guidewire was measured and found to be optimally replicated by a 95 mm lag screw. The guidewire was overreamed to  the appropriate depth before the lag screw was inserted and advanced to the appropriate depth as verified fluoroscopically in AP and lateral projections. The locking screw was advanced, then backed off a quarter turn to set the lag screw. Again the adequacy of hardware position and fracture reduction was verified  fluoroscopically in AP and lateral projections and found to be excellent.  Attention was directed distally. Using the "perfect circle" technique, the leg and fluoroscopy machine were positioned appropriately. An approximately 1.5 cm stab incision was made over the skin at the appropriate point before the drill bit was advanced through the cortex and across the static hole of the nail. The appropriate length of the screw was determined before the 42 mm distal interlocking screw was positioned, then advanced and tightened securely. Again the adequacy of screw position was verified fluoroscopically in AP and lateral projections and found to be excellent.  The wounds were irrigated thoroughly with sterile saline solution before the abductor fascia was reapproximated using #1 Vicryl interrupted sutures. The subcutaneous tissues were closed using 2-0 Vicryl interrupted sutures. The skin was closed using staples. A total of 30 cc of 0.5% Sensorcaine with epinephrine was injected in and around all incisions. Sterile occlusive dressings were applied to all wounds before the patient was transferred back to his/her hospital bed. The patient was then transferred to the recovery room in satisfactory condition after tolerating the procedure well.

## 2020-11-04 ENCOUNTER — Encounter: Payer: Self-pay | Admitting: Surgery

## 2020-11-04 DIAGNOSIS — S72002A Fracture of unspecified part of neck of left femur, initial encounter for closed fracture: Secondary | ICD-10-CM

## 2020-11-04 DIAGNOSIS — S72142A Displaced intertrochanteric fracture of left femur, initial encounter for closed fracture: Secondary | ICD-10-CM

## 2020-11-04 DIAGNOSIS — S42201S Unspecified fracture of upper end of right humerus, sequela: Secondary | ICD-10-CM

## 2020-11-04 DIAGNOSIS — S42209A Unspecified fracture of upper end of unspecified humerus, initial encounter for closed fracture: Secondary | ICD-10-CM

## 2020-11-04 LAB — BASIC METABOLIC PANEL
Anion gap: 10 (ref 5–15)
BUN: 28 mg/dL — ABNORMAL HIGH (ref 8–23)
CO2: 27 mmol/L (ref 22–32)
Calcium: 8.3 mg/dL — ABNORMAL LOW (ref 8.9–10.3)
Chloride: 102 mmol/L (ref 98–111)
Creatinine, Ser: 0.69 mg/dL (ref 0.44–1.00)
GFR, Estimated: >60 mL/min (ref >60)
Glucose, Bld: 146 mg/dL — ABNORMAL HIGH (ref 70–99)
Potassium: 4.1 mmol/L (ref 3.5–5.1)
Sodium: 139 mmol/L (ref 135–145)

## 2020-11-04 LAB — CBC
HCT: 30.1 % — ABNORMAL LOW (ref 36.0–46.0)
Hemoglobin: 10.2 g/dL — ABNORMAL LOW (ref 12.0–15.0)
MCH: 31.5 pg (ref 26.0–34.0)
MCHC: 33.9 g/dL (ref 30.0–36.0)
MCV: 92.9 fL (ref 80.0–100.0)
Platelets: 157 10*3/uL (ref 150–400)
RBC: 3.24 MIL/uL — ABNORMAL LOW (ref 3.87–5.11)
RDW: 13 % (ref 11.5–15.5)
WBC: 11.1 10*3/uL — ABNORMAL HIGH (ref 4.0–10.5)
nRBC: 0 % (ref 0.0–0.2)

## 2020-11-04 LAB — SARS CORONAVIRUS 2 (TAT 6-24 HRS): SARS Coronavirus 2: NEGATIVE

## 2020-11-04 MED ORDER — LATANOPROST 0.005 % OP SOLN
1.0000 [drp] | Freq: Every day | OPHTHALMIC | Status: DC
  Administered 2020-11-04 – 2020-11-05 (×2): 1 [drp] via OPHTHALMIC
  Filled 2020-11-04: qty 2.5

## 2020-11-04 MED ORDER — VITAMIN D (ERGOCALCIFEROL) 1.25 MG (50000 UNIT) PO CAPS
50000.0000 [IU] | ORAL_CAPSULE | ORAL | Status: DC
  Filled 2020-11-04: qty 1

## 2020-11-04 MED ORDER — DORZOLAMIDE HCL-TIMOLOL MAL 2-0.5 % OP SOLN
1.0000 [drp] | Freq: Two times a day (BID) | OPHTHALMIC | Status: DC
  Administered 2020-11-04 – 2020-11-06 (×5): 1 [drp] via OPHTHALMIC
  Filled 2020-11-04 (×2): qty 10

## 2020-11-04 MED ORDER — ENSURE ENLIVE PO LIQD
237.0000 mL | Freq: Two times a day (BID) | ORAL | Status: DC
  Administered 2020-11-04 – 2020-11-06 (×4): 237 mL via ORAL

## 2020-11-04 MED ORDER — CARBIDOPA-LEVODOPA 25-100 MG PO TABS
1.0000 | ORAL_TABLET | Freq: Three times a day (TID) | ORAL | Status: DC
  Administered 2020-11-04 – 2020-11-06 (×7): 1 via ORAL
  Filled 2020-11-04 (×7): qty 1

## 2020-11-04 NOTE — Progress Notes (Signed)
Patient refused scheduled po meds (tylenol/colace) during shift. Sometimes states she has pain. When offered, she reports no pain and refuses meds. This RN will continue to monitor for safety and pain control.

## 2020-11-04 NOTE — Progress Notes (Addendum)
Subjective: 1 Day Post-Op Procedure(s) (LRB): INTRAMEDULLARY (IM) NAIL INTERTROCHANTRIC (Left) Patient is asleep and does not respond to questions or physical stimuli.  Safety mittens intact. Patient is resting and does not appear to be in any pain. PT and Care management to assist with discharge planning, patient will need SNF> Negative for chest pain and shortness of breath Fever: no Gastrointestinal:Negative for nausea and vomiting X-rays of the right shoulder were obtained and demonstrated callus formation around the proximal humerus fracture.    Objective: Vital signs in last 24 hours: Temp:  [97.4 F (36.3 C)-98.5 F (36.9 C)] 98.1 F (36.7 C) (04/22 0527) Pulse Rate:  [66-98] 79 (04/22 0527) Resp:  [10-20] 20 (04/22 0527) BP: (107-209)/(75-116) 176/99 (04/22 0527) SpO2:  [88 %-100 %] 96 % (04/22 0527) Weight:  [65.8 kg] 65.8 kg (04/21 0811)  Intake/Output from previous day:  Intake/Output Summary (Last 24 hours) at 11/04/2020 0705 Last data filed at 11/04/2020 0400 Gross per 24 hour  Intake 1750 ml  Output 475 ml  Net 1275 ml    Intake/Output this shift: No intake/output data recorded.  Labs: Recent Labs    11/03/20 0815 11/04/20 0338  HGB 14.4 10.2*   Recent Labs    11/03/20 0815 11/04/20 0338  WBC 6.6 11.1*  RBC 4.55 3.24*  HCT 41.6 30.1*  PLT 191 157   Recent Labs    11/03/20 0815 11/04/20 0338  NA 138 139  K 4.0 4.1  CL 104 102  CO2 25 27  BUN 22 28*  CREATININE 0.53 0.69  GLUCOSE 100* 146*  CALCIUM 9.0 8.3*   Recent Labs    11/03/20 0815  INR 1.0     EXAM General - Patient is resting comfortably without any signs of pain. Extremity - ABD soft Incision: dressing C/D/I No cellulitis present Dressing/Incision - clean, dry, no drainage Motor Function - When sleeping she does move her left foot up and down.  Unable to wake the patient up to fully assess motor strength.  Past Medical History:  Diagnosis Date  . Essential  hypertension, benign   . GERD (gastroesophageal reflux disease)   . Glaucoma   . Parkinson disease (HCC)     Assessment/Plan: 1 Day Post-Op Procedure(s) (LRB): INTRAMEDULLARY (IM) NAIL INTERTROCHANTRIC (Left) Principal Problem:   Femur fracture, left (HCC) Active Problems:   GERD (gastroesophageal reflux disease)   Generalized osteoarthritis of multiple sites   Parkinson disease (HCC)   Malnutrition of mild degree (HCC)   Dementia (HCC)  Estimated body mass index is 24.89 kg/m as calculated from the following:   Height as of this encounter: 5\' 4"  (1.626 m).   Weight as of this encounter: 65.8 kg. Advance diet Up with therapy D/C IV fluids when tolerating po intake.  Labs reviewed. Hg 10.2 this AM. Up with therapy today. Will likely need SNF upon discharge. X-rays of the right shoulder demonstrated callus formation, indicative for a older injury.  No restrictions with therapy to the right shoulder.  DVT Prophylaxis - Lovenox, Foot Pumps and TED hose Weight-Bearing as tolerated to left leg  J. , PA-C [facility] 11/04/2020, 7:05 AM

## 2020-11-04 NOTE — Plan of Care (Signed)
  Problem: Pain Management: Goal: Pain level will decrease Outcome: Not Applicable   Problem: Activity: Goal: Ability to ambulate and perform ADLs will improve 11/04/2020 0359 by Margie Ege, RN Outcome: Progressing 11/04/2020 0358 by Margie Ege, RN Outcome: Progressing   Problem: Safety: Goal: Ability to remain free from injury will improve Outcome: Not Applicable   Problem: Skin Integrity: Goal: Risk for impaired skin integrity will decrease Outcome: Not Applicable

## 2020-11-04 NOTE — Plan of Care (Signed)
  Problem: Education: Goal: Knowledge of General Education information will improve Description: Including pain rating scale, medication(s)/side effects and non-pharmacologic comfort measures Outcome: Not Applicable/memory impairment

## 2020-11-04 NOTE — Progress Notes (Signed)
Initial Nutrition Assessment  DOCUMENTATION CODES:  Non-severe (moderate) malnutrition in context of chronic illness  INTERVENTION:   Continue current diet as ordered, nursing to assist pt with meals  Ensure Enlive po BID, each supplement provides 350 kcal and 20 grams of protein  NUTRITION DIAGNOSIS:  Moderate Malnutrition related to chronic illness (dementia) as evidenced by mild muscle depletion,mild fat depletion,moderate muscle depletion.  GOAL:  Patient will meet greater than or equal to 90% of their needs  MONITOR:  PO intake,Supplement acceptance  REASON FOR ASSESSMENT:  Consult Assessment of nutrition requirement/status  ASSESSMENT:  Pt presented to ED from ALF after a fall with complaints of leg and hip pain. Imaging in ED revealed a left femur fracture and a right humoral fracture. PMH relevant for Parkinson's disease, dementia, GERD, HTN  Pt resting in bed at the time of visit. Unable to provide a nutrition hx due to dementia. Mittens in place as pt has been pulling at dressings and staples from surgery. Pt requested several times to have them removed. Pt did report she goes to the dining room at her facility for meals and that she drinks ensure. Will add to support wound healing.   4/21 - Op, Reduction and internal fixation of displaced intertrochanteric left hip fracture   Relevant Scheduled Meds: . docusate sodium  100 mg Oral BID  . multivitamin with minerals   Oral Daily  . Vitamin D (Ergocalciferol)  50,000 Units Oral Q30 days   Relevant Continuous Infusions: . sodium chloride 75 mL/hr at 11/03/20 2043  .  ceFAZolin (ANCEF) IV 2 g (11/04/20 0518)   Relevant PRN Meds: bisacodyl, diphenhydrAMINE, magnesium hydroxide, metoCLOPramide, ondansetron, sodium phosphate  Labs reviewed:  SBG ranges from 100-146 mg/dL over the last 24 hours  NUTRITION - FOCUSED PHYSICAL EXAM: Flowsheet Row Most Recent Value  Orbital Region Mild depletion  Upper Arm Region Mild  depletion  Thoracic and Lumbar Region No depletion  Buccal Region No depletion  Temple Region Mild depletion  Clavicle Bone Region Mild depletion  Clavicle and Acromion Bone Region Mild depletion  Scapular Bone Region Mild depletion  Dorsal Hand Unable to assess  Patellar Region Moderate depletion  Anterior Thigh Region Moderate depletion  Posterior Calf Region Moderate depletion  Edema (RD Assessment) None  Hair Reviewed  Eyes Reviewed  Mouth Reviewed  Skin Reviewed  Nails Unable to assess (mittens)     Diet Order:   Diet Order            DIET DYS 3 Room service appropriate? Yes; Fluid consistency: Thin  Diet effective now                EDUCATION NEEDS:  Not appropriate for education at this time  Skin:  Skin Assessment: Skin Integrity Issues: Skin Integrity Issues:: Incisions Incisions: left hip/leg  Last BM:  PTA  Height:  Ht Readings from Last 1 Encounters:  11/03/20 5\' 4"  (1.626 m)   Weight:  Wt Readings from Last 1 Encounters:  11/04/20 52.8 kg    Ideal Body Weight:  54.5 kg  BMI:  Body mass index is 19.98 kg/m.  Estimated Nutritional Needs:   Kcal:  1500-1700 kcal  Protein:  75-85 g  Fluid:  >1500 mL/d   11/06/20, RD, LDN Clinical Dietitian Pager on Amion

## 2020-11-04 NOTE — Anesthesia Postprocedure Evaluation (Signed)
Anesthesia Post Note  Patient: Cheryl Cummings  Procedure(s) Performed: INTRAMEDULLARY (IM) NAIL INTERTROCHANTRIC (Left )  Patient location during evaluation: Nursing Unit Anesthesia Type: Spinal Level of consciousness: confused Pain management: pain level controlled Respiratory status: spontaneous breathing Cardiovascular status: stable Anesthetic complications: no   No complications documented.   Last Vitals:  Vitals:   11/04/20 0515 11/04/20 0527  BP: 111/75 (!) 176/99  Pulse: 98 79  Resp: 16 20  Temp: 36.8 C 36.7 C  SpO2: 98% 96%    Last Pain:  Vitals:   11/03/20 1805  TempSrc:   PainSc: Asleep                 Jaye Beagle

## 2020-11-04 NOTE — Progress Notes (Signed)
Patient removed upper honeycomb dressing; attempted to remove staples by picking at area. Changed bandage; placed mitts on hands to protect re-injuring surgical site. Will monitor.

## 2020-11-04 NOTE — NC FL2 (Signed)
[place] MEDICAID FL2 LEVEL OF CARE SCREENING TOOL     IDENTIFICATION  Patient Name: Cheryl Cummings Birthdate: August 03, 1940 Sex: female Admission Date (Current Location): 11/03/2020  [place] Number:  Chiropodist and Address:  [facility], [address], [place], [place] [address]      Provider Number: [id number]  Attending Physician Name and Address:  [name], MD  Relative Name and Phone Number:  Barrie Dunker Daughter [phone]    Current Level of Care: Hospital Recommended Level of Care: Skilled Nursing Facility Prior Approval Number:    Date Approved/Denied:   PASRR Number: [id number]  Discharge Plan: SNF    Current Diagnoses: Patient Active Problem List   Diagnosis Date Noted  . Femur fracture, left (HCC) 11/03/2020  . Acute appendicitis 03/07/2020  . Dementia (HCC) 12/18/2018  . Parkinson disease (HCC) 03/14/2017  . Malnutrition of mild degree (HCC) 03/14/2017  . GERD (gastroesophageal reflux disease)   . Glaucoma   . Generalized osteoarthritis of multiple sites     Orientation RESPIRATION BLADDER Height & Weight     Self,Place  Normal Incontinent Weight: 65.8 kg Height:  5\' 4"  (162.6 cm)  BEHAVIORAL SYMPTOMS/MOOD NEUROLOGICAL BOWEL NUTRITION STATUS      Continent    AMBULATORY STATUS COMMUNICATION OF NEEDS Skin   Extensive Assist Verbally Surgical wounds                       Personal Care Assistance Level of Assistance  Bathing,Dressing Bathing Assistance: Limited assistance   Dressing Assistance: Limited assistance     Functional Limitations Info             SPECIAL CARE FACTORS FREQUENCY  PT (By licensed PT)     PT Frequency: 5 times per week              Contractures Contractures Info: Not present    Additional Factors Info  Allergies,Code Status Code Status Info: DNR Allergies Info: Nsaids, Diazepam, Ibuprofen, Meperidine, Mushroom Extract Complex, Other, Shellfish  Allergy           Current Medications (11/04/2020):  This is the current hospital active medication list Current Facility-Administered Medications  Medication Dose Route Frequency Provider Last Rate Last Admin  . 0.9 %  sodium chloride infusion   Intravenous Continuous Cox, Amy N, DO 75 mL/hr at 11/04/20 0918 500 mL at 11/04/20 0918  . acetaminophen (TYLENOL) tablet 325-650 mg  325-650 mg Oral Q6H PRN Cox, Amy N, DO   325 mg at 11/04/20 0901  . acetaminophen (TYLENOL) tablet 500 mg  500 mg Oral Q6H Cox, Amy N, DO      . aluminum-petrolatum-zinc (1-2-3 PASTE) 0.027-13.7-12.5% paste 1 application  1 application Topical PRN Cox, Amy N, DO      . bisacodyl (DULCOLAX) suppository 10 mg  10 mg Rectal Daily PRN Cox, Amy N, DO      . carbidopa-levodopa (SINEMET IR) 25-100 MG per tablet immediate release 1 tablet  1 tablet Oral TID 08-26-1996, MD      . carbidopa-levodopa (SINEMET IR) 25-100 MG per tablet immediate release 2 tablet  2 tablet Oral q morning Cox, Amy N, DO   2 tablet at 11/04/20 0901  . diphenhydrAMINE (BENADRYL) 12.5 MG/5ML elixir 12.5-25 mg  12.5-25 mg Oral Q4H PRN Cox, Amy N, DO      . docusate sodium (COLACE) capsule 100 mg  100 mg Oral BID Cox, Amy N, DO   100  mg at 11/04/20 0901  . dorzolamide-timolol (COSOPT) 22.3-6.8 MG/ML ophthalmic solution 1 drop  1 drop Both Eyes BID [name], MD   1 drop at 11/04/20 0913  . enoxaparin (LOVENOX) injection 40 mg  40 mg Subcutaneous Q24H Cox, Amy N, DO   40 mg at 11/04/20 0904  . hydrALAZINE (APRESOLINE) tablet 25 mg  25 mg Oral Q8H PRN Cox, Amy N, DO      . latanoprost (XALATAN) 0.005 % ophthalmic solution 1 drop  1 drop Both Eyes QHS [name], MD      . loratadine (CLARITIN) tablet 10 mg  10 mg Oral Daily Cox, Amy N, DO   10 mg at 11/04/20 0901  . magnesium hydroxide (MILK OF MAGNESIA) suspension 30 mL  30 mL Oral Daily PRN Cox, Amy N, DO      . metoCLOPramide (REGLAN) tablet 5-10 mg  5-10 mg Oral Q8H PRN Cox, Amy N, DO       Or  .  metoCLOPramide (REGLAN) injection 5-10 mg  5-10 mg Intravenous Q8H PRN Cox, Amy N, DO      . morphine 2 MG/ML injection 0.5 mg  0.5 mg Intravenous Q3H PRN Cox, Amy N, DO   0.5 mg at 11/03/20 1438  . multivitamin with minerals tablet   Oral Daily Cox, Amy N, DO   1 tablet at 11/04/20 0901  . nitroGLYCERIN (NITROSTAT) SL tablet 0.4 mg  0.4 mg Sublingual Q5 min PRN Cox, Amy N, DO      . ondansetron (ZOFRAN) tablet 4 mg  4 mg Oral Q6H PRN Cox, Amy N, DO       Or  . ondansetron (ZOFRAN) injection 4 mg  4 mg Intravenous Q6H PRN Cox, Amy N, DO      . sodium phosphate (FLEET) 7-19 GM/118ML enema 1 enema  1 enema Rectal Once PRN Cox, Amy N, DO      . traMADol (ULTRAM) tablet 50 mg  50 mg Oral Q6H PRN Cox, Amy N, DO      . Vitamin D (Ergocalciferol) (DRISDOL) capsule 50,000 Units  50,000 Units Oral Q30 days [name], MD         Discharge Medications: Please see discharge summary for a list of discharge medications.  Relevant Imaging Results:  Relevant Lab Results:   Additional Information SS#: [id number]. Will discharge with a JP drain. COVID test 8/23 negative.  Barrie Dunker, RN

## 2020-11-04 NOTE — Evaluation (Addendum)
Physical Therapy Evaluation Patient Details Name: Cheryl Cummings MRN: [id number] DOB: 02-12-1941 Today's Date: 11/04/2020   History of Present Illness  Pt is an 80 y/o F admitted on 11/03/20 from [facility] following an unwitnessed fall. Pt found to have closed displaced intertrochanteric fx of L hip & underwent L IM nail on 11/03/20 by Dr. Joice Lofts & now WBAT. X-rays of R shoulder demonstrated callus formation, indicative of an older injury - no restrictions with therapy. PMH: dementia, PD, HTN, glaucoma  Clinical Impression  Pt seen for PT evaluation with pt demonstrating significant cognitive deficits impacting functional mobility (pt with baseline dementia). Pt requires total assist +1-2 assist for bed mobility & is unable to follow simple commands, nor PROM tactile cuing, to participate in LLE exercises. Pt will require 24 hr assist upon d/c & would benefit from ongoing rehab to maximize functional gains & decrease fall risk & caregiver burden. Will continue to follow pt acutely to progress bed mobility & transfers as able.     Follow Up Recommendations SNF;Supervision/Assistance - 24 hour    Equipment Recommendations  None recommended by PT    Recommendations for Other Services       Precautions / Restrictions Precautions Precautions: Fall Restrictions Weight Bearing Restrictions: Yes RUE Weight Bearing: Weight bearing as tolerated LLE Weight Bearing: Weight bearing as tolerated      Mobility  Bed Mobility Overal bed mobility: Needs Assistance Bed Mobility: Supine to Sit;Sit to Supine     Supine to sit: Total assist;HOB elevated Sit to supine: Total assist;+2 for physical assistance;HOB elevated        Transfers                    Ambulation/Gait                Stairs            Wheelchair Mobility    Modified Rankin (Stroke Patients Only)       Balance Overall balance assessment: Needs assistance Sitting-balance support: Feet  unsupported Sitting balance-Leahy Scale: Poor Sitting balance - Comments: mod assist static sitting balance Postural control: Right lateral lean                                   Pertinent Vitals/Pain Pain Assessment: Faces Faces Pain Scale: Hurts little more Pain Location: LLE with movement Pain Descriptors / Indicators: Sore Pain Intervention(s): Monitored during session;Repositioned (notified nurse)    Home Living Family/patient expects to be discharged to:: Skilled nursing facility                      Prior Function           Comments: Per chart, pt ambulatory at facility (when the fall occurred)     Hand Dominance        Extremity/Trunk Assessment   Upper Extremity Assessment Upper Extremity Assessment: Generalized weakness (somewhat attempts to hold PT's hand when encouraged to assist with uprighting trunk)    Lower Extremity Assessment Lower Extremity Assessment: Generalized weakness (pt does not actively move BLE)    Cervical / Trunk Assessment Cervical / Trunk Assessment: Other exceptions;Kyphotic Cervical / Trunk Exceptions: significant scoliosis, thoracic concave R which also promotes R lateral lean in sitting  Communication   Communication: Expressive difficulties (impacted by cognitive deficits)  Cognition Arousal/Alertness: Awake/alert Behavior During Therapy: Flat affect Overall Cognitive Status: History of  cognitive impairments - at baseline                                 General Comments: Pt with baseline dementia, able to recall name during session, inaccurate recall of birthday. Unable to follow one step commands nor attempt AROM movement when facilitated passively by therapist.      General Comments General comments (skin integrity, edema, etc.): Pt on 2.5L/min via nasal cannula, SpO2 >90%    Exercises General Exercises - Lower Extremity Long Arc Quad: PROM;Left;10 reps;Seated   Assessment/Plan     PT Assessment Patient needs continued PT services  PT Problem List Decreased strength;Decreased safety awareness;Decreased mobility;Decreased knowledge of precautions;Decreased range of motion;Decreased activity tolerance;Decreased cognition;Cardiopulmonary status limiting activity;Pain;Decreased balance;Decreased knowledge of use of DME       PT Treatment Interventions DME instruction;Therapeutic activities;Cognitive remediation;Gait training;Therapeutic exercise;Stair training;Patient/family education;Balance training;Functional mobility training;Neuromuscular re-education;Wheelchair mobility training;Modalities    PT Goals (Current goals can be found in the Care Plan section)  Acute Rehab PT Goals PT Goal Formulation: Patient unable to participate in goal setting    Frequency 7X/week   Barriers to discharge        Co-evaluation               AM-PAC PT "6 Clicks" Mobility  Outcome Measure Help needed turning from your back to your side while in a flat bed without using bedrails?: Total Help needed moving from lying on your back to sitting on the side of a flat bed without using bedrails?: Total Help needed moving to and from a bed to a chair (including a wheelchair)?: Total Help needed standing up from a chair using your arms (e.g., wheelchair or bedside chair)?: Total Help needed to walk in hospital room?: Total Help needed climbing 3-5 steps with a railing? : Total 6 Click Score: 6    End of Session Equipment Utilized During Treatment: Oxygen Activity Tolerance: Patient tolerated treatment well Patient left: in bed;with call bell/phone within reach;with SCD's reapplied;with bed alarm set Addendum: pt left with BUE mittens donned Nurse Communication: Mobility status PT Visit Diagnosis: Difficulty in walking, not elsewhere classified (R26.2);Muscle weakness (generalized) (M62.81);Pain Pain - Right/Left: Left Pain - part of body: Hip    Time: 1040-1050 PT Time  Calculation (min) (ACUTE ONLY): 10 min   Charges:   PT Evaluation $PT Eval Low Complexity: 1 Low          Aleda Grana, PT, DPT 11/04/20, 11:11 AM   Sandi Mariscal 11/04/2020, 11:05 AM

## 2020-11-04 NOTE — Progress Notes (Signed)
[facility]  - [place] [facility]   PATIENT NAME: Cheryl Cummings    MR#:  [id number]  DATE OF BIRTH:  [birth year]  SUBJECTIVE:  patient came in from [facility] assisted living after she had unwitnessed fall. She was found to have hip fracture status post surgery. She has dementia. [place]. Does not communicate much  REVIEW OF SYSTEMS:   Review of Systems  Unable to perform ROS: Dementia   Tolerating Diet: Tolerating PT:   DRUG ALLERGIES:   Allergies  Allergen Reactions  . Nsaids   . Diazepam Other (See Comments)    Unknown  Other reaction(s): Unknown Unknown Other Reaction: OTHER REACTION  . Ibuprofen Swelling  . Meperidine Other (See Comments)    Other Reaction: OTHER REACTION  . Mushroom Extract Complex Nausea And Vomiting  . Other Diarrhea    Sour Cream  . Shellfish Allergy Rash    VITALS:  Blood pressure 105/65, pulse 92, temperature 98.3 F (36.8 C), resp. rate 18, height 5\' 4"  (1.626 m), weight 52.8 kg, SpO2 99 %.  PHYSICAL EXAMINATION:   Physical Exam  GENERAL:  80 y.o.-year-old patient lying in the bed with no acute distress.  LUNGS: Normal breath sounds bilaterally, no wheezing, rales, rhonchi. No use of accessory muscles of respiration.  CARDIOVASCULAR: S1, S2 normal. No murmurs, rubs, or gallops.  ABDOMEN: Soft, nontender, nondistended. Bowel sounds present. No organomegaly or mass.  EXTREMITIES: No cyanosis, clubbing or edema b/l.    NEUROLOGIC: moves all extremities spontaneously PSYCHIATRIC:  patient is alert and dementia at baseline SKIN: No obvious rash, lesion, or ulcer.   LABORATORY PANEL:  CBC Recent Labs  Lab 11/04/20 0338  WBC 11.1*  HGB 10.2*  HCT 30.1*  PLT 157    Chemistries  Recent Labs  Lab 11/03/20 0815 11/04/20 0338  NA 138 139  K 4.0 4.1  CL 104 102  CO2 25 27  GLUCOSE 100* 146*  BUN 22 28*  CREATININE 0.53 0.69  CALCIUM 9.0 8.3*  AST 20  --   ALT 16  --   ALKPHOS 91  --   BILITOT  1.0  --    Cardiac Enzymes No results for input(s): TROPONINI in the last 168 hours. RADIOLOGY:  DG Tibia/Fibula Left  Result Date: 11/03/2020 CLINICAL DATA:  Left leg pain following a fall. EXAM: LEFT TIBIA AND FIBULA - 2 VIEW COMPARISON:  Left femur radiographs obtained the same time. FINDINGS: Diffuse osteopenia. No fracture or dislocation seen. IMPRESSION: No fracture or dislocation. Electronically Signed   By: 11/05/2020 M.D.   On: 11/03/2020 10:05   CT Head Wo Contrast  Result Date: 11/03/2020 CLINICAL DATA:  Unwitnessed fall this morning. EXAM: CT HEAD WITHOUT CONTRAST CT CERVICAL SPINE WITHOUT CONTRAST TECHNIQUE: Multidetector CT imaging of the head and cervical spine was performed following the standard protocol without intravenous contrast. Multiplanar CT image reconstructions of the cervical spine were also generated. COMPARISON:  Head CT dated 07/14/2020 FINDINGS: CT HEAD FINDINGS Brain: Stable mildly enlarged ventricles and cortical sulci. Stable marked patchy white matter low density in both cerebral hemispheres. No intracranial hemorrhage, mass lesion or CT evidence of acute infarction. Vascular: No hyperdense vessel or unexpected calcification. Skull: Normal. Negative for fracture or focal lesion. Sinuses/Orbits: Unremarkable. Other: None. In the increasing in each CT CERVICAL SPINE FINDINGS Alignment: Normal. Skull base and vertebrae: No acute fracture. No primary bone lesion or focal pathologic process. Soft tissues and spinal canal: No prevertebral fluid or swelling. No visible canal  hematoma. Disc levels:  Mild degenerative changes at the C5-6 level. Upper chest: Clear lung apices. Other: Mildly enlarged thyroid gland containing multiple nodules. The largest nodule on the left measures 2.1 cm in maximum diameter and the largest nodule on the right measures 1.6 cm in maximum diameter. Bilateral carotid artery calcifications. IMPRESSION: 1. No skull fracture or intracranial hemorrhage.  2. No cervical spine fracture or subluxation. 3. Stable mild diffuse cerebral and cerebellar atrophy. 4. Stable marked chronic small vessel white matter ischemic changes in both cerebral hemispheres. 5. Bilateral carotid artery atheromatous calcifications. 6. Mildly enlarged thyroid gland containing multiple nodules. Recommend thyroid US (ref: J Am Coll Radiol. 2015 Feb;12(2): 143-50). Electronically Signed   By: Beckie Salts M.D.   On: 11/03/2020 11:27   CT ANGIO CHEST PE W OR WO CONTRAST  Result Date: 11/03/2020 CLINICAL DATA:  Hypoxia.  Unwitnessed fall. EXAM: CT ANGIOGRAPHY CHEST WITH CONTRAST TECHNIQUE: Multidetector CT imaging of the chest was performed using the standard protocol during bolus administration of intravenous contrast. Multiplanar CT image reconstructions and MIPs were obtained to evaluate the vascular anatomy. CONTRAST:  73mL OMNIPAQUE IOHEXOL 350 MG/ML SOLN COMPARISON:  Chest CT 07/24/2018. PET-CT 08/08/2018. Chest radiographs 11/03/2020 and 01/30/2020. FINDINGS: Cardiovascular: Pulmonary arterial opacification is adequate to the segmental level without evidence of emboli. The thoracic aorta is normal in caliber. The heart is mildly enlarged. There is trace pericardial fluid. Mediastinum/Nodes: No enlarged axillary, mediastinal, or hilar lymph nodes. Nondistended esophagus. Enlarged thyroid with underlying nodules (this has been evaluated on previous imaging including a 08/08/2018 ultrasound). Lungs/Pleura: Small right pleural effusion. Two adjacent nodules in the right middle lobe measure 12 x 10 mm in 17 x 13 mm, slightly smaller than on the prior chest CT and without abnormal FDG uptake on the prior PET compatible with benignity. Dependent atelectasis is noted in both lower lobes, and there is also additional atelectasis or scarring in both lung bases. Upper Abdomen: No acute abnormality. Musculoskeletal: Mild T4, severe T7, moderate T9, mild T11, and mild T12 compression fractures are  all unchanged from the prior chest CT. Moderate T5 and severe T6 compression fractures are new from the prior chest CT, however no visible unhealed fracture line or paravertebral hematoma is seen to clearly indicate acute fractures and these may also be chronic. There is markedly increased thoracic kyphosis, and there is moderate to severe thoracolumbar levoscoliosis. A displaced right humeral neck fracture is noted on the topogram and is new from 01/30/2020 chest radiographs but may also be chronic. Review of the MIP images confirms the above findings. IMPRESSION: 1. No evidence of pulmonary emboli. 2. Small right pleural effusion. 3. Bilateral lower lobe atelectasis. 4. Moderate T5 and severe T6 compression fractures which are new from 2020 but may be chronic. If the patient has back pain concerning for an acute fracture in this region, consider thoracic spine MRI for further evaluation. 5. Displaced right humeral neck fracture, new from 01/2020 but incompletely evaluated as this is only visible on the topogram. Electronically Signed   By: Sebastian Ache M.D.   On: 11/03/2020 14:10   CT Cervical Spine Wo Contrast  Result Date: 11/03/2020 CLINICAL DATA:  Unwitnessed fall this morning. EXAM: CT HEAD WITHOUT CONTRAST CT CERVICAL SPINE WITHOUT CONTRAST TECHNIQUE: Multidetector CT imaging of the head and cervical spine was performed following the standard protocol without intravenous contrast. Multiplanar CT image reconstructions of the cervical spine were also generated. COMPARISON:  Head CT dated 07/14/2020 FINDINGS: CT HEAD FINDINGS Brain: Stable  mildly enlarged ventricles and cortical sulci. Stable marked patchy white matter low density in both cerebral hemispheres. No intracranial hemorrhage, mass lesion or CT evidence of acute infarction. Vascular: No hyperdense vessel or unexpected calcification. Skull: Normal. Negative for fracture or focal lesion. Sinuses/Orbits: Unremarkable. Other: None. In the increasing  in each CT CERVICAL SPINE FINDINGS Alignment: Normal. Skull base and vertebrae: No acute fracture. No primary bone lesion or focal pathologic process. Soft tissues and spinal canal: No prevertebral fluid or swelling. No visible canal hematoma. Disc levels:  Mild degenerative changes at the C5-6 level. Upper chest: Clear lung apices. Other: Mildly enlarged thyroid gland containing multiple nodules. The largest nodule on the left measures 2.1 cm in maximum diameter and the largest nodule on the right measures 1.6 cm in maximum diameter. Bilateral carotid artery calcifications. IMPRESSION: 1. No skull fracture or intracranial hemorrhage. 2. No cervical spine fracture or subluxation. 3. Stable mild diffuse cerebral and cerebellar atrophy. 4. Stable marked chronic small vessel white matter ischemic changes in both cerebral hemispheres. 5. Bilateral carotid artery atheromatous calcifications. 6. Mildly enlarged thyroid gland containing multiple nodules. Recommend thyroid US (ref: J Am Coll Radiol. 2015 Feb;12(2): 143-50). Electronically Signed   By: Beckie SaltsSteven  Reid M.D.   On: 11/03/2020 11:27   DG Chest Portable 1 View  Result Date: 11/03/2020 CLINICAL DATA:  Preop radiograph EXAM: PORTABLE CHEST 1 VIEW COMPARISON:  01/30/2020. FINDINGS: Kyphoscoliosis deformity diminishes exam detail. Aortic tortuosity. No pleural effusion identified. There is mild diffuse increase interstitial markings concerning for pulmonary edema. Bilateral hazy lung opacities are also noted which appear new from previous imaging and is concerning for edema versus infection. IMPRESSION: 1. Findings concerning for CHF. Hazy lung opacities may represent alveolar edema or superimposed infection. Electronically Signed   By: Signa Kellaylor  Stroud M.D.   On: 11/03/2020 10:03   DG Shoulder Left  Result Date: 11/03/2020 CLINICAL DATA:  Left shoulder pain following a fall. EXAM: LEFT SHOULDER - 2+ VIEW COMPARISON:  None. FINDINGS: Diffuse osteopenia. No  fracture or dislocation seen. IMPRESSION: No fracture or dislocation. Electronically Signed   By: Beckie SaltsSteven  Reid M.D.   On: 11/03/2020 10:05   DG Shoulder Right Port  Result Date: 11/03/2020 CLINICAL DATA:  Follow-up right humerus fracture EXAM: PORTABLE RIGHT SHOULDER COMPARISON:  CT 11/03/2020, 01/30/2020, 11/03/2020 FINDINGS: Fracture deformity involving the right humeral neck with moderate valgus angulation of the shaft. Callus formation is present consistent with subacute to chronic fracture. No dislocation IMPRESSION: Fracture deformity involving the right humeral neck, appears subacute to chronic given presence of callus. Electronically Signed   By: Jasmine PangKim  Fujinaga M.D.   On: 11/03/2020 18:41   DG HIP OPERATIVE UNILAT WITH PELVIS LEFT  Result Date: 11/03/2020 CLINICAL DATA:  Hip fracture EXAM: OPERATIVE left HIP (WITH PELVIS IF PERFORMED) 4 VIEWS TECHNIQUE: Fluoroscopic spot image(s) were submitted for interpretation post-operatively. COMPARISON:  11/03/2020 FINDINGS: Four low resolution intraoperative spot views of the left femur/hip. Total fluoroscopy time was 1 minutes 7 seconds. The images demonstrate intramedullary rod and distal screw fixation of the left femur for intertrochanteric fracture. There is anatomic alignment. IMPRESSION: Intraoperative fluoroscopic assistance provided during surgical fixation of proximal left femur fracture Electronically Signed   By: Jasmine PangKim  Fujinaga M.D.   On: 11/03/2020 18:38   DG Hip Unilat W or Wo Pelvis 2-3 Views Right  Result Date: 11/03/2020 CLINICAL DATA:  Left leg pain and outward rotation of the leg following a fall. Right hip radiographs were requested and performed. EXAM: DG HIP (WITH  OR WITHOUT PELVIS) 2-3V RIGHT COMPARISON:  Left femur radiographs obtained at the same time. FINDINGS: Left intertrochanteric fracture with varus angulation. No right hip fracture or dislocation seen. Diffuse osteopenia. Lower lumbar spine degenerative changes and scoliosis.  IMPRESSION: Left intertrochanteric fracture with varus angulation. Electronically Signed   By: Beckie Salts M.D.   On: 11/03/2020 10:04   DG Femur Min 2 Views Left  Result Date: 11/03/2020 CLINICAL DATA:  Preoperative study. EXAM: LEFT FEMUR 2 VIEWS COMPARISON:  Left hip series 11/03/2020. FINDINGS: Comminuted intertrochanteric left hip fracture. Remainder of the femur is intact. Diffuse osteopenia and degenerative change. Dystrophic calcification number tendon. IMPRESSION: Comminuted intertrochanteric left hip fracture. Remainder of the femur is intact. Electronically Signed   By: Maisie Fus  Register   On: 11/03/2020 10:01   ASSESSMENT AND PLAN:  INDIRA SORENSON is a 80 y.o. female with medical history significant for advanced dementia, Parkinson's disease, hypertension, presents to the emergency department from nursing facility for chief concerns of an unwitnessed fall.  Left femur fracture unwitnessed mechanical fall - orthopedic surgery consult with Dr. Joice Lofts . Patient is status post reduction and internal fixation of displaced into trochanteric left hip fracture--POD #1 -- PRN pain meds -- PT OT to see patient   Right humoral neck fracture- portable shoulder x-ray was done and found to be fracture deformity involving the right humeral neck, appears subacute to chronic given presence of callus-- according to orthopedic appears old  Acute hypoxia--resolved -CT a of the chest for PE was read as no PE, small right pleural effusion, bilateral lower lobe atelectasis, moderate T5 and severe T6 compression fracture which are new from 2020 but may be chronic, displaced right humeral neck fracture, new from July 2021 -- sats more than 95% on air  Parkinson-Resumed home carbidopa levodopa  Hypertension- no home antihypertensive at this time - Hydralazine 25 mg p.o. every 8 hours as needed for SBP greater than 170, l  Protein-calorie malnutrition, Moderate -- dietitian has been  consulted  Advanced dementia    DVT prophylaxis: Enoxaparin 40 mg subcutaneous every 24 hours Code Status: DNR Diet: reg Family Communication: left msg forTamera Johnson over the phone  Disposition Plan: SNF Consults called: Orthopedic Admission status: Inpatient, MedSurg, no telemetry  Level of care: Med-Surg Status is: Inpatient  Remains inpatient appropriate because:Unsafe d/c plan   Dispo: The patient is from: ALF              Anticipated d/c is to: SNF              Patient currently is medically stable to d/c.   Difficult to place patient No   Patient is postop day one left hip fracture surgery. Physical therapy recommends rehab. TOC for discharge planning. Patient will go to rehab once bed available     TOTAL TIME TAKING CARE OF THIS PATIENT: 30 minutes.  >50% time spent on counselling and coordination of care  Note: This dictation was prepared with Dragon dictation along with smaller phrase technology. Any transcriptional errors that result from this process are unintentional.  [name] M.D    [facility]   CC: Primary care physician; [name], MDPatient ID: Gaylene Brooks, female   DOB: 1941-07-08, 80 y.o.   MRN: [id number]

## 2020-11-04 NOTE — Evaluation (Signed)
Occupational Therapy Evaluation Patient Details Name: Cheryl Cummings MRN: [id number] DOB: Sep 28, 1940 Today's Date: 11/04/2020    History of Present Illness Pt is an 80 y.o. female admitted to [facility] from [facility] following an unwitnessed fall. Pt found to have closed displaced intertrochanteric fx of L hip & underwent L IM nail on 11/03/20 by Dr. Joice Lofts & now [facility]. X-rays of R shoulder demonstrated callus formation, indicative of an older injury - no restrictions with therapy. PMH: dementia, PD, HTN, glaucoma   Clinical Impression   Pt. Presents with Dementia, weakness, pain, and limited functional mobility which limits her ability to complete ADL, and IADL tasks. Pt. Resides at  [facility], and required assist with ADL tasks. Per chart pt. was ambulatory at the facility. Pt. Has a history of Dementia with cognitive limitations. Pt. is unable to consistently follow one step commands. Pt. requires Total Assist for all ADLs, and IADL tasks. Pt. Was assisted with repositioning  secondary to pt. presenting with increased lateral neck flexion to the right upon arrival.  Pt. Could benefit from OT services for ADL training,  And pt/caregiver education about cognitive compensatory strategies during ADLs. Pt. would benefit from SNF level of care upon discharge.    Follow Up Recommendations  SNF    Equipment Recommendations       Recommendations for Other Services       Precautions / Restrictions Precautions Precautions: Fall Restrictions Weight Bearing Restrictions: Yes RUE Weight Bearing: Weight bearing as tolerated LLE Weight Bearing: Weight bearing as tolerated      Mobility Bed Mobility    Total assist    Transfers      Pt. unable                Balance                                 ADL either performed or assessed with clinical judgement   ADL                                         General ADL Comments:  Pt. requires Total Assist with ADLs     Vision   Additional Comments: Difficult to assess secondary to cognition.     Perception     Praxis      Pertinent Vitals/Pain Pain Assessment: No/denies pain Faces Pain Scale: Hurts little more Pain Location: LLE with movement Pain Descriptors / Indicators: Sore Pain Intervention(s): Monitored during session;Repositioned (notified nurse)     Hand Dominance     Extremity/Trunk Assessment Upper Extremity Assessment Upper Extremity Assessment: Generalized weakness   Lower Extremity Assessment Lower Extremity Assessment: Generalized weakness (pt does not actively move BLE)   Cervical / Trunk Assessment Cervical / Trunk Assessment: Other exceptions;Kyphotic Cervical / Trunk Exceptions: significant scoliosis, thoracic concave R which also promotes R lateral lean in sitting   Communication Communication Communication: Expressive difficulties   Cognition Arousal/Alertness: Awake/alert Behavior During Therapy: Flat affect Overall Cognitive Status: History of cognitive impairments - at baseline                                 General Comments: Pt with baseline dementia. Pt. unable to follow simple one step commands   General Comments  Pt on 2.5L/min via nasal cannula, SpO2 >90%    Exercises General Exercises - Lower Extremity Long Arc Quad: PROM;Left;10 reps;Seated   Shoulder Instructions      Home Living Family/patient expects to be discharged to:: Skilled nursing facility                                        Prior Functioning/Environment Level of Independence: Needs assistance  Gait / Transfers Assistance Needed: Ambulatory at the facility ADL's / Homemaking Assistance Needed: Assist with ADLs   Comments: Per chart, pt ambulatory at facility (when the fall occurred)        OT Problem List: Decreased strength;Decreased cognition;Impaired UE functional use;Decreased knowledge of use of DME  or AE;Decreased range of motion;Pain      OT Treatment/Interventions: Self-care/ADL training;Therapeutic exercise;Patient/family education;Therapeutic activities;DME and/or AE instruction    OT Goals(Current goals can be found in the care plan section) Acute Rehab OT Goals OT Goal Formulation: Patient unable to participate in goal setting  OT Frequency: Min 1X/week   Barriers to D/C:            Co-evaluation              AM-PAC OT "6 Clicks" Daily Activity     Outcome Measure Help from another person eating meals?: Total Help from another person taking care of personal grooming?: Total Help from another person toileting, which includes using toliet, bedpan, or urinal?: Total Help from another person bathing (including washing, rinsing, drying)?: Total Help from another person to put on and taking off regular upper body clothing?: Total Help from another person to put on and taking off regular lower body clothing?: Total 6 Click Score: 6   End of Session    Activity Tolerance: Patient tolerated treatment well;Other (comment) Patient left: in bed;with bed alarm set;with call bell/phone within reach  OT Visit Diagnosis: Muscle weakness (generalized) (M62.81)                Time: 1610-9604 OT Time Calculation (min): 13 min Charges:  OT General Charges $OT Visit: 1 Visit OT Evaluation $OT Eval Low Complexity: 1 Low  Olegario Messier, MS, OTR/L   Olegario Messier 11/04/2020, 1:32 PM

## 2020-11-05 DIAGNOSIS — E440 Moderate protein-calorie malnutrition: Secondary | ICD-10-CM | POA: Insufficient documentation

## 2020-11-05 LAB — BASIC METABOLIC PANEL
Anion gap: 8 (ref 5–15)
BUN: 41 mg/dL — ABNORMAL HIGH (ref 8–23)
CO2: 25 mmol/L (ref 22–32)
Calcium: 8.1 mg/dL — ABNORMAL LOW (ref 8.9–10.3)
Chloride: 105 mmol/L (ref 98–111)
Creatinine, Ser: 0.75 mg/dL (ref 0.44–1.00)
GFR, Estimated: >60 mL/min (ref >60)
Glucose, Bld: 107 mg/dL — ABNORMAL HIGH (ref 70–99)
Potassium: 3.6 mmol/L (ref 3.5–5.1)
Sodium: 138 mmol/L (ref 135–145)

## 2020-11-05 LAB — CBC
HCT: 24.1 % — ABNORMAL LOW (ref 36.0–46.0)
Hemoglobin: 8.3 g/dL — ABNORMAL LOW (ref 12.0–15.0)
MCH: 31.9 pg (ref 26.0–34.0)
MCHC: 34.4 g/dL (ref 30.0–36.0)
MCV: 92.7 fL (ref 80.0–100.0)
Platelets: 126 10*3/uL — ABNORMAL LOW (ref 150–400)
RBC: 2.6 MIL/uL — ABNORMAL LOW (ref 3.87–5.11)
RDW: 13.3 % (ref 11.5–15.5)
WBC: 7.4 10*3/uL (ref 4.0–10.5)
nRBC: 0 % (ref 0.0–0.2)

## 2020-11-05 MED ORDER — ENOXAPARIN SODIUM 40 MG/0.4ML ~~LOC~~ SOLN: 40.0000 mg | SUBCUTANEOUS | 0 refills | Status: DC

## 2020-11-05 MED ORDER — TRAMADOL HCL 50 MG PO TABS: 50.0000 mg | ORAL_TABLET | Freq: Four times a day (QID) | ORAL | 0 refills | Status: DC | PRN

## 2020-11-05 NOTE — Progress Notes (Signed)
[facility]  - [place] at [facility]   PATIENT NAME: Cheryl Cummings    MR#:  [id number]  DATE OF BIRTH:  March 17, 1941  SUBJECTIVE:  patient came in from [facility] assisted living after she had unwitnessed fall. She was found to have hip fracture status post surgery. She has dementia. Has mittens. Does not communicate much Drank some ensure with me.  REVIEW OF SYSTEMS:   Review of Systems  Unable to perform ROS: Dementia   Tolerating Diet: Tolerating PT: rehab  DRUG ALLERGIES:   Allergies  Allergen Reactions  . Nsaids   . Diazepam Other (See Comments)    Unknown  Other reaction(s): Unknown Unknown Other Reaction: OTHER REACTION  . Ibuprofen Swelling  . Meperidine Other (See Comments)    Other Reaction: OTHER REACTION  . Mushroom Extract Complex Nausea And Vomiting  . Other Diarrhea    Sour Cream  . Shellfish Allergy Rash    VITALS:  Blood pressure 123/73, pulse 90, temperature 99.9 F (37.7 C), resp. rate 14, height 5\' 4"  (1.626 m), weight 52.8 kg, SpO2 98 %.  PHYSICAL EXAMINATION:   Physical Exam  GENERAL:  80 y.o.-year-old patient lying in the bed with no acute distress.  LUNGS: Normal breath sounds bilaterally, no wheezing, rales, rhonchi. No use of accessory muscles of respiration.  CARDIOVASCULAR: S1, S2 normal. No murmurs, rubs, or gallops.  ABDOMEN: Soft, nontender, nondistended. Bowel sounds present. No organomegaly or mass.  EXTREMITIES: No cyanosis, clubbing or edema b/l.    NEUROLOGIC: moves all extremities spontaneously PSYCHIATRIC:  patient is alert and dementia at baseline SKIN: No obvious rash, lesion, or ulcer.   LABORATORY PANEL:  CBC Recent Labs  Lab 11/05/20 0518  WBC 7.4  HGB 8.3*  HCT 24.1*  PLT 126*    Chemistries  Recent Labs  Lab 11/03/20 0815 11/04/20 0338 11/05/20 0518  NA 138   < > 138  K 4.0   < > 3.6  CL 104   < > 105  CO2 25   < > 25  GLUCOSE 100*   < > 107*  BUN 22   < > 41*  CREATININE 0.53    < > 0.75  CALCIUM 9.0   < > 8.1*  AST 20  --   --   ALT 16  --   --   ALKPHOS 91  --   --   BILITOT 1.0  --   --    < > = values in this interval not displayed.   Cardiac Enzymes No results for input(s): TROPONINI in the last 168 hours. RADIOLOGY:  CT ANGIO CHEST PE W OR WO CONTRAST  Result Date: 11/03/2020 CLINICAL DATA:  Hypoxia.  Unwitnessed fall. EXAM: CT ANGIOGRAPHY CHEST WITH CONTRAST TECHNIQUE: Multidetector CT imaging of the chest was performed using the standard protocol during bolus administration of intravenous contrast. Multiplanar CT image reconstructions and MIPs were obtained to evaluate the vascular anatomy. CONTRAST:  34mL OMNIPAQUE IOHEXOL 350 MG/ML SOLN COMPARISON:  Chest CT 07/24/2018. PET-CT 08/08/2018. Chest radiographs 11/03/2020 and 01/30/2020. FINDINGS: Cardiovascular: Pulmonary arterial opacification is adequate to the segmental level without evidence of emboli. The thoracic aorta is normal in caliber. The heart is mildly enlarged. There is trace pericardial fluid. Mediastinum/Nodes: No enlarged axillary, mediastinal, or hilar lymph nodes. Nondistended esophagus. Enlarged thyroid with underlying nodules (this has been evaluated on previous imaging including a 08/08/2018 ultrasound). Lungs/Pleura: Small right pleural effusion. Two adjacent nodules in the right middle lobe measure 12 x  10 mm in 17 x 13 mm, slightly smaller than on the prior chest CT and without abnormal FDG uptake on the prior PET compatible with benignity. Dependent atelectasis is noted in both lower lobes, and there is also additional atelectasis or scarring in both lung bases. Upper Abdomen: No acute abnormality. Musculoskeletal: Mild T4, severe T7, moderate T9, mild T11, and mild T12 compression fractures are all unchanged from the prior chest CT. Moderate T5 and severe T6 compression fractures are new from the prior chest CT, however no visible unhealed fracture line or paravertebral hematoma is seen to  clearly indicate acute fractures and these may also be chronic. There is markedly increased thoracic kyphosis, and there is moderate to severe thoracolumbar levoscoliosis. A displaced right humeral neck fracture is noted on the topogram and is new from 01/30/2020 chest radiographs but may also be chronic. Review of the MIP images confirms the above findings. IMPRESSION: 1. No evidence of pulmonary emboli. 2. Small right pleural effusion. 3. Bilateral lower lobe atelectasis. 4. Moderate T5 and severe T6 compression fractures which are new from 2020 but may be chronic. If the patient has back pain concerning for an acute fracture in this region, consider thoracic spine MRI for further evaluation. 5. Displaced right humeral neck fracture, new from 01/2020 but incompletely evaluated as this is only visible on the topogram. Electronically Signed   By: Sebastian Ache M.D.   On: 11/03/2020 14:10   DG Shoulder Right Port  Result Date: 11/03/2020 CLINICAL DATA:  Follow-up right humerus fracture EXAM: PORTABLE RIGHT SHOULDER COMPARISON:  CT 11/03/2020, 01/30/2020, 11/03/2020 FINDINGS: Fracture deformity involving the right humeral neck with moderate valgus angulation of the shaft. Callus formation is present consistent with subacute to chronic fracture. No dislocation IMPRESSION: Fracture deformity involving the right humeral neck, appears subacute to chronic given presence of callus. Electronically Signed   By: Jasmine Pang M.D.   On: 11/03/2020 18:41   DG HIP OPERATIVE UNILAT WITH PELVIS LEFT  Result Date: 11/03/2020 CLINICAL DATA:  Hip fracture EXAM: OPERATIVE left HIP (WITH PELVIS IF PERFORMED) 4 VIEWS TECHNIQUE: Fluoroscopic spot image(s) were submitted for interpretation post-operatively. COMPARISON:  11/03/2020 FINDINGS: Four low resolution intraoperative spot views of the left femur/hip. Total fluoroscopy time was 1 minutes 7 seconds. The images demonstrate intramedullary rod and distal screw fixation of the  left femur for intertrochanteric fracture. There is anatomic alignment. IMPRESSION: Intraoperative fluoroscopic assistance provided during surgical fixation of proximal left femur fracture Electronically Signed   By: Jasmine Pang M.D.   On: 11/03/2020 18:38   ASSESSMENT AND PLAN:  Cheryl Cummings is a 80 y.o. female with medical history significant for advanced dementia, Parkinson's disease, hypertension, presents to the emergency department from nursing facility for chief concerns of an unwitnessed fall.  Left femur fracture unwitnessed mechanical fall - orthopedic surgery consult with Dr. Joice Lofts . Patient is status post reduction and internal fixation of displaced into trochanteric left hip fracture--POD #1 -- PRN pain meds -- PT OT recommends rehab   Right humoral neck fracture- portable shoulder x-ray was done and found to be fracture deformity involving the right humeral neck, appears subacute to chronic given presence of callus-- according to orthopedic appears old  Acute hypoxia--resolved -CT a of the chest for PE was read as no PE, small right pleural effusion, bilateral lower lobe atelectasis, moderate T5 and severe T6 compression fracture which are new from 2020 but may be chronic, displaced right humeral neck fracture, new from July 2021 --  sats more than 95% on air  Parkinson-Resumed home carbidopa levodopa  Hypertension- no home antihypertensive at this time - Hydralazine 25 mg p.o. every 8 hours as needed for SBP greater than 170, l  Protein-calorie malnutrition, Moderate -- dietitian has been consulted  Advanced dementia    DVT prophylaxis: Enoxaparin 40 mg subcutaneous every 24 hours Code Status: DNR Diet: reg Family Communication:spoke withTamera Johnson over the phone  Disposition Plan: SNF Consults called: Orthopedic Admission status: Inpatient, MedSurg, no telemetry  Level of care: Med-Surg Status is: Inpatient  Remains inpatient appropriate  because:Unsafe d/c plan   Dispo: The patient is from: ALF              Anticipated d/c is to: SNF              Patient currently is medically stable to d/c.   Difficult to place patient No   Patient is postop day 2 left hip fracture surgery. Physical therapy recommends rehab. TOC for discharge planning. Patient will go to rehab once bed available     TOTAL TIME TAKING CARE OF THIS PATIENT: 30 minutes.  >50% time spent on counselling and coordination of care  Note: This dictation was prepared with Dragon dictation along with smaller phrase technology. Any transcriptional errors that result from this process are unintentional.  [name] M.D    [facility]   CC: Primary care physician; [name], MDPatient ID: Gaylene Brooks, female   DOB: [birth year], 80 y.o.   MRN: [id number]

## 2020-11-05 NOTE — Progress Notes (Signed)
Subjective: 2 Days Post-Op Procedure(s) (LRB): INTRAMEDULLARY (IM) NAIL INTERTROCHANTRIC (Left) Patient does not give me a verbal pain score this AM. Patient is resting and does not appear to be in any pain. Patient will need SNF following discharge. Negative for chest pain and shortness of breath Fever: no Gastrointestinal:Negative for nausea and vomiting X-rays of the right shoulder were obtained and demonstrated callus formation around the proximal humerus fracture.  No restrictions to the right shoulder.  Objective: Vital signs in last 24 hours: Temp:  [98.1 F (36.7 C)-99.9 F (37.7 C)] 99.9 F (37.7 C) (04/23 0827) Pulse Rate:  [86-90] 90 (04/23 0827) Resp:  [14-18] 14 (04/23 0827) BP: (94-123)/(59-75) 123/73 (04/23 0827) SpO2:  [90 %-100 %] 98 % (04/23 0827) Weight:  [52.8 kg] 52.8 kg (04/22 1300)  Intake/Output from previous day:  Intake/Output Summary (Last 24 hours) at 11/05/2020 0956 Last data filed at 11/05/2020 0831 Gross per 24 hour  Intake 1626.77 ml  Output 500 ml  Net 1126.77 ml    Intake/Output this shift: Total I/O In: 1014.8 [I.V.:1014.8] Out: 300 [Urine:300]  Labs: Recent Labs    11/03/20 0815 11/04/20 0338 11/05/20 0518  HGB 14.4 10.2* 8.3*   Recent Labs    11/04/20 0338 11/05/20 0518  WBC 11.1* 7.4  RBC 3.24* 2.60*  HCT 30.1* 24.1*  PLT 157 126*   Recent Labs    11/04/20 0338 11/05/20 0518  NA 139 138  K 4.1 3.6  CL 102 105  CO2 27 25  BUN 28* 41*  CREATININE 0.69 0.75  GLUCOSE 146* 107*  CALCIUM 8.3* 8.1*   Recent Labs    11/03/20 0815  INR 1.0   EXAM General - Patient is resting comfortably without any signs of pain.  Oriented only to self. Extremity - ABD soft Dorsiflexion/Plantar flexion intact Incision: scant drainage No cellulitis present Dressing/Incision - Mild blood drainage to the left hip. Motor Function - Patient is able to dorsiflex and plantarflex the left ankle.  Past Medical History:  Diagnosis Date   . Essential hypertension, benign   . GERD (gastroesophageal reflux disease)   . Glaucoma   . Parkinson disease (HCC)     Assessment/Plan: 2 Days Post-Op Procedure(s) (LRB): INTRAMEDULLARY (IM) NAIL INTERTROCHANTRIC (Left) Principal Problem:   Femur fracture, left (HCC) Active Problems:   GERD (gastroesophageal reflux disease)   Generalized osteoarthritis of multiple sites   Parkinson disease (HCC)   Malnutrition of mild degree (HCC)   Dementia (HCC)   Closed fracture of left hip (HCC)   Fracture of proximal humerus   Malnutrition of moderate degree  Estimated body mass index is 19.98 kg/m as calculated from the following:   Height as of this encounter: 5\' 4"  (1.626 m).   Weight as of this encounter: 52.8 kg. Advance diet Up with therapy D/C IV fluids when tolerating po intake.  Labs reviewed. Hg 8.3 this AM. Up with therapy today.  Will need SNF upon discharge. Work on . X-rays of the right shoulder demonstrated callus formation, indicative for a older injury.  No restrictions with therapy to the right shoulder.  Upon discharge, Continue Lovenox 40mg  for 14 days. Staple can be removed on 11/17/20 by SNF faculty.  Follow-up with [facility] in 6 weeks for repeat evaluation.  DVT Prophylaxis - Lovenox, Foot Pumps and TED hose Weight-Bearing as tolerated to left leg  J. 01/17/21, PA-C [facility] 11/05/2020, 9:56 AM

## 2020-11-05 NOTE — Discharge Instructions (Signed)

## 2020-11-05 NOTE — Progress Notes (Signed)
Physical Therapy Treatment Patient Details Name: Cheryl Cummings MRN: [id number] DOB: March 23, 1941 Today's Date: 11/05/2020    History of Present Illness Pt is an 80 y.o. female admitted to [facility] from [facility] following an unwitnessed fall. Pt found to have closed displaced intertrochanteric fx of L hip & underwent L IM nail on 11/03/20 by Dr. Joice Lofts & now WBAT. X-rays of R shoulder demonstrated callus formation, indicative of an older injury - no restrictions with therapy. PMH: dementia, PD, HTN, glaucoma    PT Comments    Pt was long sitting in bed with care channel on and BUE mitts donned. Untouched breakfast tray at bedside. RN aware pt will need assistance with feeding. Pt is oriented to self only. Has hx of severe dementia that greatly limits session progress. She did sit EOB x ~ 3 minutes. C/o LLE pain with all movements. Minimal active movements performed due to cognition/poor ability to initiate movements. Pt will require extensive PT going forward to decrease caregiver burden. MD arrived at conclusion of session.    Follow Up Recommendations  SNF;Supervision/Assistance - 24 hour     Equipment Recommendations  None recommended by PT       Precautions / Restrictions Precautions Precautions: Fall Restrictions Weight Bearing Restrictions: Yes LLE Weight Bearing: Weight bearing as tolerated    Mobility  Bed Mobility Overal bed mobility: Needs Assistance Bed Mobility: Supine to Sit;Sit to Supine     Supine to sit: Total assist;HOB elevated Sit to supine: Total assist;HOB elevated        Transfers  General transfer comment: unsafe to trial        Balance Overall balance assessment: Needs assistance Sitting-balance support: Feet unsupported Sitting balance-Leahy Scale: Poor Sitting balance - Comments: min-mod assist to maintain balance EOB x ~ 3 minutes         Cognition Arousal/Alertness: Awake/alert Behavior During Therapy: Flat affect Overall Cognitive  Status: History of cognitive impairments - at baseline    General Comments: Pt with baseline dementia. Pt. unable to follow simple one step commands. poor abilityt o intiate movements             Pertinent Vitals/Pain Pain Assessment: Faces Faces Pain Scale: Hurts a little bit Pain Location: LLE with movement Pain Descriptors / Indicators: Discomfort Pain Intervention(s): Limited activity within patient's tolerance;Monitored during session;Premedicated before session;Repositioned           PT Goals (current goals can now be found in the care plan section) Acute Rehab PT Goals Patient Stated Goal: none stated Progress towards PT goals: Not progressing toward goals - comment (cognition limiting)    Frequency    7X/week      PT Plan Current plan remains appropriate       AM-PAC PT "6 Clicks" Mobility   Outcome Measure  Help needed turning from your back to your side while in a flat bed without using bedrails?: Total Help needed moving from lying on your back to sitting on the side of a flat bed without using bedrails?: Total Help needed moving to and from a bed to a chair (including a wheelchair)?: Total Help needed standing up from a chair using your arms (e.g., wheelchair or bedside chair)?: Total Help needed to walk in hospital room?: Total Help needed climbing 3-5 steps with a railing? : Total 6 Click Score: 6    End of Session Equipment Utilized During Treatment: Oxygen Activity Tolerance: Patient tolerated treatment well;Other (comment) (limited by severe dementia) Patient left: in bed;with  call bell/phone within reach;with SCD's reapplied;with bed alarm set Nurse Communication: Mobility status PT Visit Diagnosis: Difficulty in walking, not elsewhere classified (R26.2);Muscle weakness (generalized) (M62.81);Pain Pain - Right/Left: Left Pain - part of body: Hip     Time: 0920-0933 PT Time Calculation (min) (ACUTE ONLY): 13 min  Charges:  $Therapeutic  Activity: 8-22 mins                     Jetta Lout PTA 11/05/20, 9:47 AM

## 2020-11-05 NOTE — Plan of Care (Signed)
  Problem: Pain Management: Goal: Pain level will decrease Outcome: Progressing   

## 2020-11-06 LAB — BASIC METABOLIC PANEL
Anion gap: 10 (ref 5–15)
BUN: 32 mg/dL — ABNORMAL HIGH (ref 8–23)
CO2: 25 mmol/L (ref 22–32)
Calcium: 8.2 mg/dL — ABNORMAL LOW (ref 8.9–10.3)
Chloride: 108 mmol/L (ref 98–111)
Creatinine, Ser: 0.61 mg/dL (ref 0.44–1.00)
GFR, Estimated: >60 mL/min (ref >60)
Glucose, Bld: 106 mg/dL — ABNORMAL HIGH (ref 70–99)
Potassium: 3.6 mmol/L (ref 3.5–5.1)
Sodium: 143 mmol/L (ref 135–145)

## 2020-11-06 MED ORDER — DOCUSATE SODIUM 100 MG PO CAPS
100.0000 mg | ORAL_CAPSULE | Freq: Two times a day (BID) | ORAL | 0 refills | Status: DC
Start: 2020-11-06 — End: 2022-03-15

## 2020-11-06 MED ORDER — TRAMADOL HCL 50 MG PO TABS: 50.0000 mg | ORAL_TABLET | Freq: Four times a day (QID) | ORAL | 0 refills | Status: DC | PRN

## 2020-11-06 MED ORDER — ENSURE ENLIVE PO LIQD: 237.0000 mL | Freq: Two times a day (BID) | ORAL | 12 refills | Status: DC

## 2020-11-06 NOTE — Progress Notes (Signed)
Physical Therapy Treatment Patient Details Name: Cheryl Cummings MRN: [id number] DOB: 12-12-1940 Today's Date: 11/06/2020    History of Present Illness Pt is an 80 y.o. female admitted to [facility] from [facility] following an unwitnessed fall. Pt found to have closed displaced intertrochanteric fx of L hip & underwent L IM nail on 11/03/20 by Dr. Joice Lofts & now WBAT. X-rays of R shoulder demonstrated callus formation, indicative of an older injury - no restrictions with therapy. PMH: dementia, PD, HTN, glaucoma    PT Comments    Participated in exercises as described below.  Assisted tech with linen change due to wet linens.  Pt with no initiation to assist with rolling or care.  Does not seem in pain with tasks.  Sitting deferred as pt not engaged in tasks or any attempts to participate in care.     Follow Up Recommendations  SNF;Supervision/Assistance - 24 hour     Equipment Recommendations  None recommended by PT    Recommendations for Other Services       Precautions / Restrictions Precautions Precautions: Fall Restrictions Weight Bearing Restrictions: Yes LLE Weight Bearing: Weight bearing as tolerated    Mobility  Bed Mobility Overal bed mobility: Needs Assistance Bed Mobility: Rolling Rolling: Total assist              Transfers                    Ambulation/Gait                 Stairs             Wheelchair Mobility    Modified Rankin (Stroke Patients Only)       Balance                                            Cognition Arousal/Alertness: Awake/alert Behavior During Therapy: Flat affect Overall Cognitive Status: History of cognitive impairments - at baseline                                        Exercises Other Exercises Other Exercises: LLE PROM x 10    General Comments        Pertinent Vitals/Pain Pain Assessment: Faces Faces Pain Scale: No hurt Pain Location: seems  comfortable with tasks today    Home Living                      Prior Function            PT Goals (current goals can now be found in the care plan section) Progress towards PT goals: Not progressing toward goals - comment    Frequency    7X/week      PT Plan Current plan remains appropriate    Co-evaluation              AM-PAC PT "6 Clicks" Mobility   Outcome Measure  Help needed turning from your back to your side while in a flat bed without using bedrails?: Total Help needed moving from lying on your back to sitting on the side of a flat bed without using bedrails?: Total Help needed moving to and from a bed to a chair (including a wheelchair)?: Total Help needed standing  up from a chair using your arms (e.g., wheelchair or bedside chair)?: Total Help needed to walk in hospital room?: Total Help needed climbing 3-5 steps with a railing? : Total 6 Click Score: 6    End of Session   Activity Tolerance: Patient tolerated treatment well;Other (comment) (limited by severe dementia) Patient left: in bed;with call bell/phone within reach;with SCD's reapplied;with bed alarm set Nurse Communication: Mobility status PT Visit Diagnosis: Difficulty in walking, not elsewhere classified (R26.2);Muscle weakness (generalized) (M62.81);Pain Pain - Right/Left: Left Pain - part of body: Hip     Time: 0947-1000 PT Time Calculation (min) (ACUTE ONLY): 13 min  Charges:  $Therapeutic Activity: 8-22 mins                    Danielle Dess, PTA 11/06/20, 11:15 AM

## 2020-11-06 NOTE — Progress Notes (Signed)
Subjective: 3 Days Post-Op Procedure(s) (LRB): INTRAMEDULLARY (IM) NAIL INTERTROCHANTRIC (Left) Patient says no when asked if she is in any pain. Patient is resting and does not appear to be in any pain. Patient will need SNF following discharge. Negative for chest pain and shortness of breath Fever: no Gastrointestinal:Negative for nausea and vomiting X-rays of the right shoulder were obtained and demonstrated callus formation around the proximal humerus fracture.  No restrictions to the right shoulder.  Objective: Vital signs in last 24 hours: Temp:  [98.2 F (36.8 C)-101.3 F (38.5 C)] 98.9 F (37.2 C) (04/24 0807) Pulse Rate:  [91-106] 106 (04/24 0807) Resp:  [14-18] 16 (04/24 0807) BP: (118-134)/(62-72) 134/72 (04/24 0807) SpO2:  [89 %-95 %] 93 % (04/24 0807)  Intake/Output from previous day:  Intake/Output Summary (Last 24 hours) at 11/06/2020 0917 Last data filed at 11/06/2020 0300 Gross per 24 hour  Intake 0 ml  Output 100 ml  Net -100 ml    Intake/Output this shift: No intake/output data recorded.  Labs: Recent Labs    11/04/20 0338 11/05/20 0518  HGB 10.2* 8.3*   Recent Labs    11/04/20 0338 11/05/20 0518  WBC 11.1* 7.4  RBC 3.24* 2.60*  HCT 30.1* 24.1*  PLT 157 126*   Recent Labs    11/05/20 0518 11/06/20 0552  NA 138 143  K 3.6 3.6  CL 105 108  CO2 25 25  BUN 41* 32*  CREATININE 0.75 0.61  GLUCOSE 107* 106*  CALCIUM 8.1* 8.2*   No results for input(s): LABPT, INR in the last 72 hours. EXAM General - Patient is resting comfortably without any signs of pain.  Oriented only to self. Extremity - ABD soft Dorsiflexion/Plantar flexion intact Incision: scant drainage No cellulitis present Dressing/Incision - Mild blood drainage to the left hip. Motor Function - Patient is able to dorsiflex and plantarflex the left ankle.  Past Medical History:  Diagnosis Date  . Essential hypertension, benign   . GERD (gastroesophageal reflux disease)    . Glaucoma   . Parkinson disease (HCC)     Assessment/Plan: 3 Days Post-Op Procedure(s) (LRB): INTRAMEDULLARY (IM) NAIL INTERTROCHANTRIC (Left) Principal Problem:   Femur fracture, left (HCC) Active Problems:   GERD (gastroesophageal reflux disease)   Generalized osteoarthritis of multiple sites   Parkinson disease (HCC)   Malnutrition of mild degree (HCC)   Dementia (HCC)   Closed fracture of left hip (HCC)   Fracture of proximal humerus   Malnutrition of moderate degree  Estimated body mass index is 19.98 kg/m as calculated from the following:   Height as of this encounter: 5\' 4"  (1.626 m).   Weight as of this encounter: 52.8 kg. Advance diet Up with therapy D/C IV fluids when tolerating po intake.  Patient with fever overnight, 101.3.  No signs of infection such as erythema or purulent drainage to the left hip. Up with therapy today.  Will need SNF upon discharge. Patient has had a BM. X-rays of the right shoulder demonstrated callus formation, indicative for a older injury.  No restrictions with therapy to the right shoulder.  Upon discharge, Continue Lovenox 40mg  for 14 days. Staple can be removed on 11/17/20 by SNF faculty.  Follow-up with [facility] in 6 weeks for repeat evaluation.  DVT Prophylaxis - Lovenox, Foot Pumps and TED hose Weight-Bearing as tolerated to left leg  J. 01/17/21, PA-C [facility] 11/06/2020, 9:17 AM

## 2020-11-06 NOTE — Plan of Care (Signed)
  Problem: Pain Management: Goal: Pain level will decrease Outcome: Progressing   
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
## 2020-11-06 NOTE — Discharge Summary (Addendum)
[facility] - [place] at [facility]   PATIENT NAME: Cheryl Cummings    MR#:  [id number]  DATE OF BIRTH:  1941-01-16  DATE OF ADMISSION:  11/03/2020 ADMITTING PHYSICIAN: Amy N Cox, DO  DATE OF DISCHARGE: 11/06/2020  PRIMARY CARE PHYSICIAN: [name], MD    ADMISSION DIAGNOSIS:  Femur fracture, left (HCC) [S72.92XA] Acute respiratory failure with hypoxia (HCC) [J96.01] Closed fracture of left hip, initial encounter (HCC) [S72.002A]  DISCHARGE DIAGNOSIS:  Closed fracture left Hip s/p surgery Right Humeral neck fracture--chronic  SECONDARY DIAGNOSIS:   Past Medical History:  Diagnosis Date  . Essential hypertension, benign   . GERD (gastroesophageal reflux disease)   . Glaucoma   . Parkinson disease [facility])     HOSPITAL COURSE:   Cheryl Munos Buffingtonis a 80 y.o.femalewith medical history significant foradvanced dementia,Parkinson's disease, hypertension, presents to the emergency department from nursing facility for chief concerns of an unwitnessed fall.  Left femur fracture unwitnessed mechanical fall -orthopedic surgery consult with Dr. Joice Lofts . Patient is status post reduction and internal fixation of displaced into trochanteric left hip fracture--POD #3 -- PRN pain meds -- PT OT recommends rehab   Right humoral neck fracture-portable shoulder x-ray was done and found to be fracture deformity involving the right humeral neck, appears subacute to chronic given presence of callus-- according to orthopedic appears old  Acute hypoxia--resolved -CT a of the chest for PE was read as no PE, small right pleural effusion, bilateral lower lobe atelectasis, moderate T5 and severe T6 compression fracture which are new from 2020 but may be chronic, displaced right humeral neck fracture, new from July 2021 -- sats more than 95% on air  Parkinson-Resumed home carbidopa levodopa  Hypertension-no home antihypertensive at this time -Hydralazine 25 mg  p.o. every 8 hours as needed for SBP greater than 170  Protein-calorie malnutrition, Moderate -- dietitian has been consulted  Advanced dementia    DVT prophylaxis:Enoxaparin 40 mg subcutaneous every 24 hours Code Status:DNR Diet:reg Family Communication:spoke withTamera Johnson over the phone4/24 Disposition Plan:SNF Consults called:Orthopedic Admission status:Inpatient, MedSurg, no telemetry  Level of care: Med-Surg Status is: Inpatient  Dispo: The patient is from: ALF  Anticipated d/c is to: SNF  Patient currently is medically stable to d/c.              Difficult to place patient No   Will d/c to [facility] today per TOC.  CONSULTS OBTAINED:  Treatment Team:  Christena Flake, MD  DRUG ALLERGIES:   Allergies  Allergen Reactions  . Nsaids   . Diazepam Other (See Comments)    Unknown  Other reaction(s): Unknown Unknown Other Reaction: OTHER REACTION  . Ibuprofen Swelling  . Meperidine Other (See Comments)    Other Reaction: OTHER REACTION  . Mushroom Extract Complex Nausea And Vomiting  . Other Diarrhea    Sour Cream  . Shellfish Allergy Rash    DISCHARGE MEDICATIONS:   Allergies as of 11/06/2020      Reactions   Nsaids    Diazepam Other (See Comments)   Unknown Other reaction(s): Unknown Unknown Other Reaction: OTHER REACTION   Ibuprofen Swelling   Meperidine Other (See Comments)   Other Reaction: OTHER REACTION   Mushroom Extract Complex Nausea And Vomiting   Other Diarrhea   Sour Cream   Shellfish Allergy Rash      Medication List    STOP taking these medications   magnesium hydroxide 400 MG/5ML suspension Commonly known as: MILK OF MAGNESIA  TAKE these medications   acetaminophen 500 MG tablet Commonly known as: TYLENOL Take 500 mg by mouth every 4 (four) hours as needed for mild pain or moderate pain. What changed: Another medication with the same name was removed. Continue taking this  medication, and follow the directions you see here.   alum & mag hydroxide-simeth 200-200-20 MG/5ML suspension Commonly known as: MAALOX/MYLANTA Take 30 mLs by mouth every 6 (six) hours as needed for indigestion or heartburn.   bismuth subsalicylate 262 MG/15ML suspension Commonly known as: PEPTO BISMOL Take 10 mLs by mouth every 6 (six) hours as needed for indigestion or diarrhea or loose stools.   carbamide peroxide 6.5 % OTIC solution Commonly known as: DEBROX Place 5 drops into both ears 2 (two) times daily as needed (wax buildup).   carbidopa-levodopa 25-100 MG tablet Commonly known as: SINEMET IR Take 1-2 tablets by mouth in the morning, at noon, in the evening, and at bedtime.   cetirizine 10 MG tablet Commonly known as: ZYRTEC Take 10 mg by mouth daily as needed for allergies.   Dermagran ointment Generic drug: aluminum hydroxide Apply 1 application topically as needed. To coccyx/buttocks for skin protection   dextromethorphan-guaiFENesin 10-100 MG/5ML liquid Commonly known as: ROBITUSSIN-DM Take 10 mLs by mouth every 4 (four) hours as needed for cough.   docusate sodium 100 MG capsule Commonly known as: COLACE Take 1 capsule (100 mg total) by mouth 2 (two) times daily.   dorzolamide-timolol 22.3-6.8 MG/ML ophthalmic solution Commonly known as: COSOPT Place 1 drop into both eyes 2 (two) times daily.   enoxaparin 40 MG/0.4ML injection Commonly known as: LOVENOX Inject 0.4 mLs (40 mg total) into the skin daily.   feeding supplement Liqd Take 237 mLs by mouth 2 (two) times daily between meals.   fluPHENAZine decanoate 25 MG/ML injection Commonly known as: PROLIXIN Inject 1 mL into the muscle daily. Every 21 days   multivitamin with minerals tablet Take 1 tablet by mouth daily.   nitroGLYCERIN 0.4 MG SL tablet Commonly known as: NITROSTAT Place 0.4 mg under the tongue every 5 (five) minutes as needed for chest pain.   ondansetron 4 MG tablet Commonly known  as: ZOFRAN Take 4 mg by mouth every 8 (eight) hours as needed for nausea or vomiting.   SF 5000 Plus 1.1 % Crea dental cream Generic drug: sodium fluoride Place 1 application onto teeth in the morning, at noon, and at bedtime. Brush thoroughly for 2 minutes, do not rinse after   traMADol 50 MG tablet Commonly known as: ULTRAM Take 1 tablet (50 mg total) by mouth every 6 (six) hours as needed for moderate pain.   TRAVOPROST (BAK FREE) OP Place 1 drop into both eyes at bedtime. What changed: Another medication with the same name was removed. Continue taking this medication, and follow the directions you see here.   Vitamin D (Ergocalciferol) 1.25 MG (50000 UNIT) Caps capsule Commonly known as: DRISDOL Take 1 capsule (50,000 Units total) by mouth every 30 (thirty) days. What changed: when to take this            Discharge Care Instructions  (From admission, onward)         Start     Ordered   11/06/20 0000  Discharge wound care:       Comments: Reinforce dressing until discontinued   11/06/20 [id number]          If you experience worsening of your admission symptoms, develop shortness of breath, life threatening emergency,  suicidal or homicidal thoughts you must seek medical attention immediately by calling 911 or calling your MD immediately  if symptoms less severe.  You Must read complete instructions/literature along with all the possible adverse reactions/side effects for all the Medicines you take and that have been prescribed to you. Take any new Medicines after you have completely understood and accept all the possible adverse reactions/side effects.   Please note  You were cared for by a hospitalist during your hospital stay. If you have any questions about your discharge medications or the care you received while you were in the hospital after you are discharged, you can call the unit and asked to speak with the hospitalist on call if the hospitalist that took care of you  is not available. Once you are discharged, your primary care physician will handle any further medical issues. Please note that NO REFILLS for any discharge medications will be authorized once you are discharged, as it is imperative that you return to your primary care physician (or establish a relationship with a primary care physician if you do not have one) for your aftercare needs so that they can reassess your need for medications and monitor your lab values. Today   SUBJECTIVE  Per RN note from last nite--some agitation last nite Calm and pleasant today Vitals ok  VITAL SIGNS:  Blood pressure 134/72, pulse (!) 106, temperature 98.9 F (37.2 C), resp. rate 16, height 5\' 4"  (1.626 m), weight 52.8 kg, SpO2 93 %.  I/O:    Intake/Output Summary (Last 24 hours) at 11/06/2020 0912 Last data filed at 11/06/2020 0300 Gross per 24 hour  Intake 0 ml  Output 100 ml  Net -100 ml    PHYSICAL EXAMINATION:  GENERAL:  80 y.o.-year-old patient lying in the bed with no acute distress. Thin fraile  LUNGS: Normal breath sounds bilaterally, no wheezing, rales,rhonchi or crepitation. No use of accessory muscles of respiration.  CARDIOVASCULAR: S1, S2 normal. No murmurs, rubs, or gallops.  ABDOMEN: Soft, non-tender, non-distended. Bowel sounds present. No organomegaly or mass.  EXTREMITIES: No pedal edema, cyanosis, or clubbing.  NEUROLOGIC:no focal deficit, weak PSYCHIATRIC: The patient is alert. @baseline  has dementia Skin no rash  DATA REVIEW:   CBC  Recent Labs  Lab 11/05/20 0518  WBC 7.4  HGB 8.3*  HCT 24.1*  PLT 126*    Chemistries  Recent Labs  Lab 11/03/20 0815 11/04/20 0338 11/06/20 0552  NA 138   < > 143  K 4.0   < > 3.6  CL 104   < > 108  CO2 25   < > 25  GLUCOSE 100*   < > 106*  BUN 22   < > 32*  CREATININE 0.53   < > 0.61  CALCIUM 9.0   < > 8.2*  AST 20  --   --   ALT 16  --   --   ALKPHOS 91  --   --   BILITOT 1.0  --   --    < > = values in this interval not  displayed.    Microbiology Results   Recent Results (from the past 240 hour(s))  Resp Panel by RT-PCR (Flu A&B, Covid) Nasopharyngeal Swab     Status: None   Collection Time: 11/03/20  8:15 AM   Specimen: Nasopharyngeal Swab; Nasopharyngeal(NP) swabs in vial transport medium  Result Value Ref Range Status   SARS Coronavirus 2 by RT PCR NEGATIVE NEGATIVE Final    Comment: (NOTE)  SARS-CoV-2 target nucleic acids are NOT DETECTED.  The SARS-CoV-2 RNA is generally detectable in upper respiratory specimens during the acute phase of infection. The lowest concentration of SARS-CoV-2 viral copies this assay can detect is 138 copies/mL. A negative result does not preclude SARS-Cov-2 infection and should not be used as the sole basis for treatment or other patient management decisions. A negative result may occur with  improper specimen collection/handling, submission of specimen other than nasopharyngeal swab, presence of viral mutation(s) within the areas targeted by this assay, and inadequate number of viral copies(<138 copies/mL). A negative result must be combined with clinical observations, patient history, and epidemiological information. The expected result is Negative.  Fact Sheet for Patients:  BloggerCourse.com  Fact Sheet for Healthcare Providers:  SeriousBroker.it  This test is no t yet approved or cleared by the [place] FDA and  has been authorized for detection and/or diagnosis of SARS-CoV-2 by FDA under an Emergency Use Authorization (EUA). This EUA will remain  in effect (meaning this test can be used) for the duration of the COVID-19 declaration under Section 564(b)(1) of the Act, 21 U.S.C.section 360bbb-3(b)(1), unless the authorization is terminated  or revoked sooner.       Influenza A by PCR NEGATIVE NEGATIVE Final   Influenza B by PCR NEGATIVE NEGATIVE Final    Comment: (NOTE) The Xpert Xpress  SARS-CoV-2/FLU/RSV plus assay is intended as an aid in the diagnosis of influenza from Nasopharyngeal swab specimens and should not be used as a sole basis for treatment. Nasal washings and aspirates are unacceptable for Xpert Xpress SARS-CoV-2/FLU/RSV testing.  Fact Sheet for Patients: BloggerCourse.com  Fact Sheet for Healthcare Providers: SeriousBroker.it  This test is not yet approved or cleared by the [place] FDA and has been authorized for detection and/or diagnosis of SARS-CoV-2 by FDA under an Emergency Use Authorization (EUA). This EUA will remain in effect (meaning this test can be used) for the duration of the COVID-19 declaration under Section 564(b)(1) of the Act, 21 U.S.C. section 360bbb-3(b)(1), unless the authorization is terminated or revoked.  Performed at [facility], [address] [place], [place] [address]   SARS CORONAVIRUS 2 (TAT 6-24 HRS) Nasopharyngeal Nasopharyngeal Swab     Status: None   Collection Time: 11/04/20  1:03 PM   Specimen: Nasopharyngeal Swab  Result Value Ref Range Status   SARS Coronavirus 2 NEGATIVE NEGATIVE Final    Comment: (NOTE) SARS-CoV-2 target nucleic acids are NOT DETECTED.  The SARS-CoV-2 RNA is generally detectable in upper and lower respiratory specimens during the acute phase of infection. Negative results do not preclude SARS-CoV-2 infection, do not rule out co-infections with other pathogens, and should not be used as the sole basis for treatment or other patient management decisions. Negative results must be combined with clinical observations, patient history, and epidemiological information. The expected result is Negative.  Fact Sheet for Patients: HairSlick.no  Fact Sheet for Healthcare Providers: quierodirigir.com  This test is not yet approved or cleared by the [place] FDA and  has  been authorized for detection and/or diagnosis of SARS-CoV-2 by FDA under an Emergency Use Authorization (EUA). This EUA will remain  in effect (meaning this test can be used) for the duration of the COVID-19 declaration under Se ction 564(b)(1) of the Act, 21 U.S.C. section 360bbb-3(b)(1), unless the authorization is terminated or revoked sooner.  Performed at [facility], [address] [place], [place] [address]     RADIOLOGY:  No results found.  CODE STATUS:     Code Status Orders  (From admission, onward)         Start     Ordered   11/03/20 1510  Do not attempt resuscitation (DNR)  Continuous       Question Answer Comment  In the event of cardiac or respiratory ARREST Do not call a "code blue"   In the event of cardiac or respiratory ARREST Do not perform Intubation, CPR, defibrillation or ACLS   In the event of cardiac or respiratory ARREST Use medication by any route, position, wound care, and other measures to relive pain and suffering. May use oxygen, suction and manual treatment of airway obstruction as needed for comfort.      11/03/20 1510        Code Status History    Date Active Date Inactive Code Status Order ID Comments User Context   11/03/2020 1456 11/03/2020 1510 DNR [id number]  Lovenia Kim, DO Inpatient   11/03/2020 1226 11/03/2020 1456 Full Code [id number]  Lovenia Kim, DO ED   03/07/2020 1354 03/09/2020 2100 Full Code [id number]  Henrene Dodge, MD ED   02/29/2016 2015 03/01/2016 0800 Full Code [id number]  Katha Hamming, MD ED   Advance Care Planning Activity    Advance Directive Documentation   Flowsheet Row Most Recent Value  Type of Advance Directive Out of facility DNR (pink MOST or yellow form)  Pre-existing out of facility DNR order (yellow form or pink MOST form) --  "MOST" Form in Place? --       TOTAL TIME TAKING CARE OF THIS PATIENT: *40* minutes.    [name] M.D  [facility]    CC: Primary care physician; [name], MD

## 2020-11-06 NOTE — Progress Notes (Signed)
Patient began crying out loud while in bed. Arrived in her room to assess for any pain or other distress. She stated that she does not want 'them' to hurt her animal. V/S remain WNL. Patient may have awakened from sleep with thoughts of someone harming an animal. RN assured her that she is safe and no one is hurting her animal. She calmed down and returned to sleep shortly thereafter. Will continue to monitor.

## 2020-11-06 NOTE — TOC Transition Note (Signed)
Transition of Care [facility]) - CM/SW Discharge Note   Patient Details  Name: Cheryl Cummings MRN: [id number] Date of Birth: [birth year]  [facility] CM/SW Contact:  Maud Deed, LCSW Phone Number: 11/06/2020, 10:35 AM   Clinical Narrative:    Pt is medically stable for discharge per MD. Pt will be transported to [facility] for rehab. Pt will be going to room 204, call to report number is [phone] CSW notified pt's daughter of discharge. CSW will arrange transport once nursing staff is ready.   Final next level of care: Skilled Nursing Facility Barriers to Discharge: No Barriers Identified   Patient Goals and CMS Choice Patient states their goals for this hospitalization and ongoing recovery are:: get well      Discharge Placement              Patient chooses bed at: [facility] Patient to be transferred to facility by: [facility] Name of family member notified: Delaney Meigs Patient and family notified of of transfer: 11/06/20  Discharge Plan and Services   Discharge Planning Services: CM Consult                                 Social Determinants of Health (SDOH) Interventions     Readmission Risk Interventions No flowsheet data found.

## 2020-11-07 DIAGNOSIS — F22 Delusional disorders: Secondary | ICD-10-CM | POA: Diagnosis not present

## 2020-11-07 DIAGNOSIS — G20 Parkinson's disease: Secondary | ICD-10-CM | POA: Diagnosis not present

## 2020-11-07 DIAGNOSIS — S72002A Fracture of unspecified part of neck of left femur, initial encounter for closed fracture: Secondary | ICD-10-CM | POA: Diagnosis not present

## 2020-11-14 DIAGNOSIS — N390 Urinary tract infection, site not specified: Secondary | ICD-10-CM | POA: Diagnosis not present

## 2020-11-24 ENCOUNTER — Telehealth: Payer: Self-pay

## 2020-11-24 DIAGNOSIS — F22 Delusional disorders: Secondary | ICD-10-CM | POA: Diagnosis not present

## 2020-11-24 DIAGNOSIS — G3183 Dementia with Lewy bodies: Secondary | ICD-10-CM | POA: Diagnosis not present

## 2020-11-24 DIAGNOSIS — S72002A Fracture of unspecified part of neck of left femur, initial encounter for closed fracture: Secondary | ICD-10-CM | POA: Diagnosis not present

## 2020-11-24 DIAGNOSIS — E441 Mild protein-calorie malnutrition: Secondary | ICD-10-CM | POA: Diagnosis not present

## 2020-11-24 DIAGNOSIS — G20 Parkinson's disease: Secondary | ICD-10-CM | POA: Diagnosis not present

## 2020-11-24 NOTE — Telephone Encounter (Signed)
Tamra Johnson left v/m that pt is a resident at [facility] and they are offering a covid booster. Pt recently had hip surgery where pts hip was pinned. Should pt wait to get covid booster or does Dr Alphonsus Sias want her to go ahead and get the covid booster at [facility].

## 2020-11-24 NOTE — Telephone Encounter (Signed)
She should definitely go ahead with the second booster. If anything, her recent surgery puts her at greater risk with a COVID infection (and not a reason to delay the immunization)

## 2020-11-25 NOTE — Telephone Encounter (Signed)
Spoke to pt's daughter. She will fill out the form for [facility] to give her the booster.

## 2020-12-21 DIAGNOSIS — Y92128 Other place in nursing home as the place of occurrence of the external cause: Secondary | ICD-10-CM | POA: Diagnosis not present

## 2020-12-21 DIAGNOSIS — R296 Repeated falls: Secondary | ICD-10-CM | POA: Diagnosis not present

## 2021-01-20 DIAGNOSIS — F0280 Dementia in other diseases classified elsewhere without behavioral disturbance: Secondary | ICD-10-CM

## 2021-01-20 DIAGNOSIS — K219 Gastro-esophageal reflux disease without esophagitis: Secondary | ICD-10-CM

## 2021-01-20 DIAGNOSIS — G20 Parkinson's disease: Secondary | ICD-10-CM | POA: Diagnosis not present

## 2021-01-20 DIAGNOSIS — M1990 Unspecified osteoarthritis, unspecified site: Secondary | ICD-10-CM | POA: Diagnosis not present

## 2021-01-20 DIAGNOSIS — E43 Unspecified severe protein-calorie malnutrition: Secondary | ICD-10-CM | POA: Diagnosis not present

## 2021-01-20 DIAGNOSIS — F22 Delusional disorders: Secondary | ICD-10-CM | POA: Diagnosis not present

## 2021-01-25 DIAGNOSIS — S61200A Unspecified open wound of right index finger without damage to nail, initial encounter: Secondary | ICD-10-CM | POA: Diagnosis not present

## 2021-03-27 DIAGNOSIS — G20 Parkinson's disease: Secondary | ICD-10-CM | POA: Diagnosis not present

## 2021-03-27 DIAGNOSIS — M159 Polyosteoarthritis, unspecified: Secondary | ICD-10-CM | POA: Diagnosis not present

## 2021-03-27 DIAGNOSIS — G3183 Dementia with Lewy bodies: Secondary | ICD-10-CM | POA: Diagnosis not present

## 2021-03-27 DIAGNOSIS — F22 Delusional disorders: Secondary | ICD-10-CM | POA: Diagnosis not present

## 2021-05-26 DIAGNOSIS — I10 Essential (primary) hypertension: Secondary | ICD-10-CM

## 2021-05-26 DIAGNOSIS — M1990 Unspecified osteoarthritis, unspecified site: Secondary | ICD-10-CM | POA: Diagnosis not present

## 2021-05-26 DIAGNOSIS — F0280 Dementia in other diseases classified elsewhere without behavioral disturbance: Secondary | ICD-10-CM

## 2021-05-26 DIAGNOSIS — F22 Delusional disorders: Secondary | ICD-10-CM | POA: Diagnosis not present

## 2021-05-26 DIAGNOSIS — K219 Gastro-esophageal reflux disease without esophagitis: Secondary | ICD-10-CM | POA: Diagnosis not present

## 2021-05-26 DIAGNOSIS — E43 Unspecified severe protein-calorie malnutrition: Secondary | ICD-10-CM

## 2021-05-26 DIAGNOSIS — G20 Parkinson's disease: Secondary | ICD-10-CM | POA: Diagnosis not present

## 2021-06-07 DIAGNOSIS — B351 Tinea unguium: Secondary | ICD-10-CM

## 2021-07-24 DIAGNOSIS — N3001 Acute cystitis with hematuria: Secondary | ICD-10-CM | POA: Diagnosis not present

## 2021-09-08 IMAGING — CT CT HEAD W/O CM
3 of 5 series · 14 of 47 positions shown, 16 images · non-contrast
Comparison: CT head 12/21/2018.  MRI brain 03/01/2016

CLINICAL DATA: Unwitnessed fall. Head trauma with hematoma to the
right temple.

EXAM:
CT HEAD WITHOUT CONTRAST
TECHNIQUE: Contiguous axial images were obtained from the base of the skull
through the vertex without intravenous contrast.

[Series 4: coronal soft tissue · coronal · 0.31mm/px · 3 of 65 slices shown]
[im 22/65  brain]
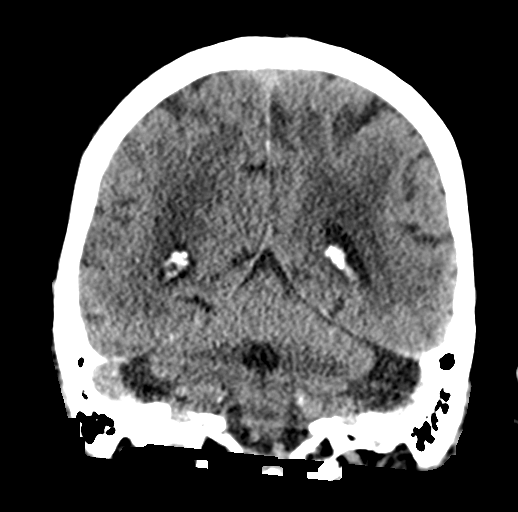
[im 29/65  brain]
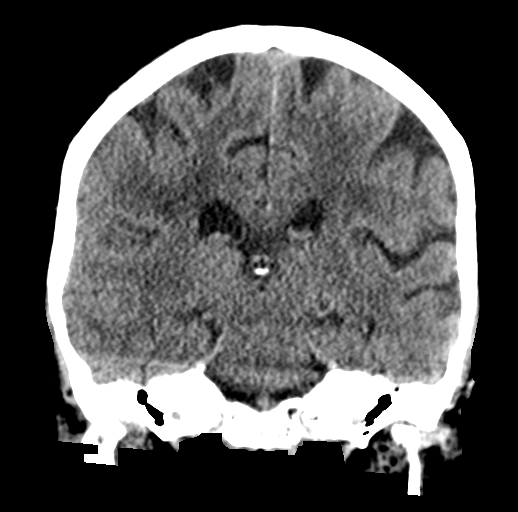
[im 36/65  brain]
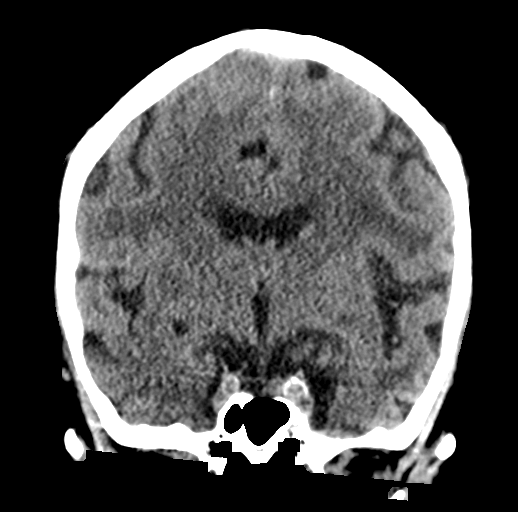

[Series 5: sagittal soft tissue · sagittal · 0.31mm/px · 3 of 54 slices shown]
[im 18/54  brain]
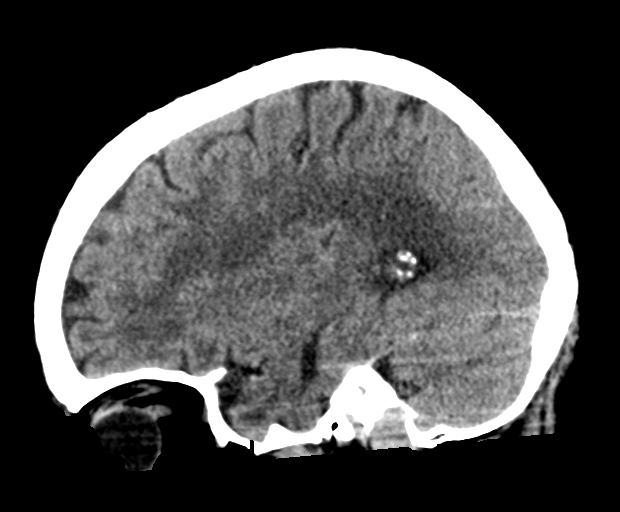
[im 27/54  brain]
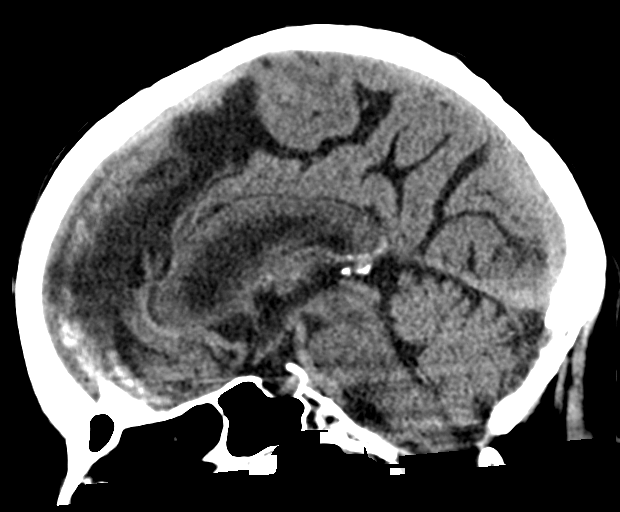
[im 36/54  brain]
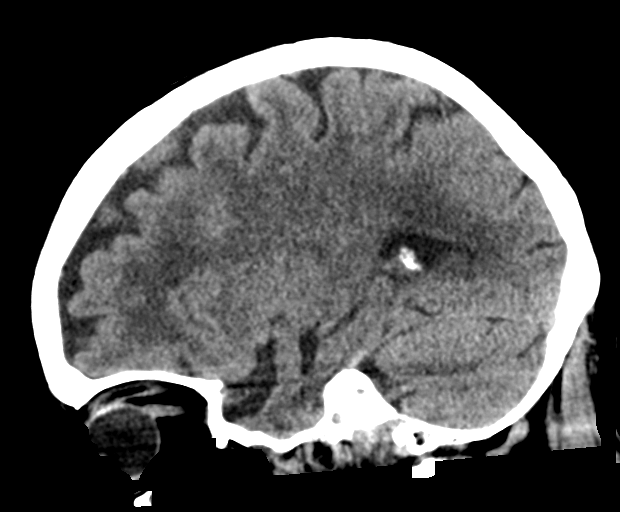

[Series 6: axial soft tissue · axial · 0.32mm/px · z∈[-155,-31]mm · 8 of 54 slices shown, 10 images]
[im 6/54  brain]
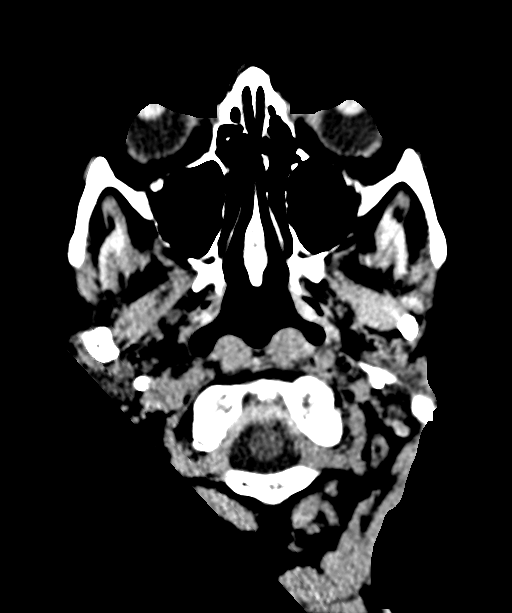
[im 6/54  bone]
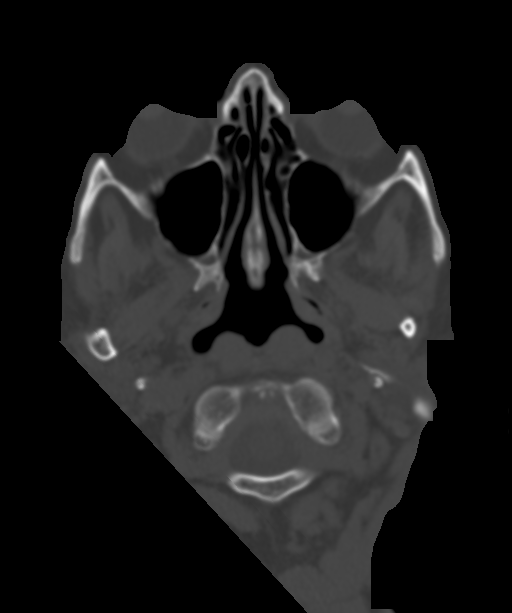
[im 12/54  brain]
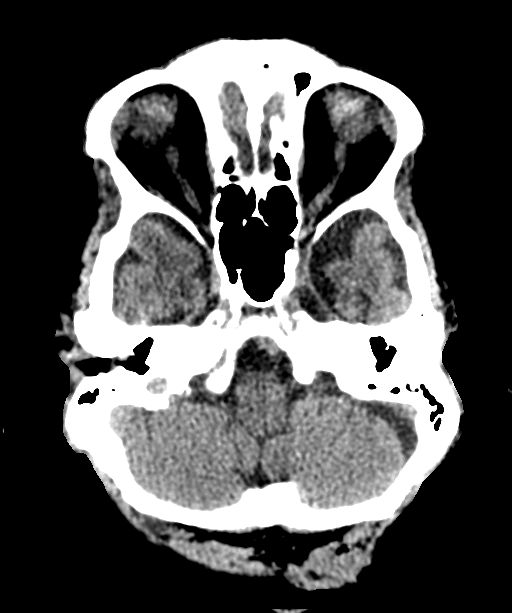
[im 18/54  brain]
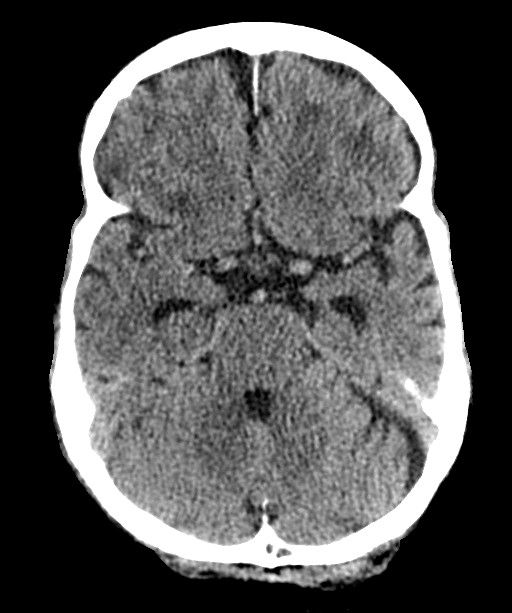
[im 24/54  brain]
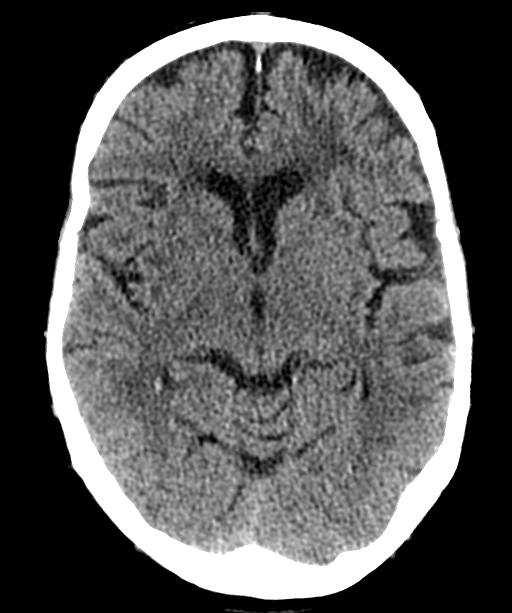
[im 30/54  brain]
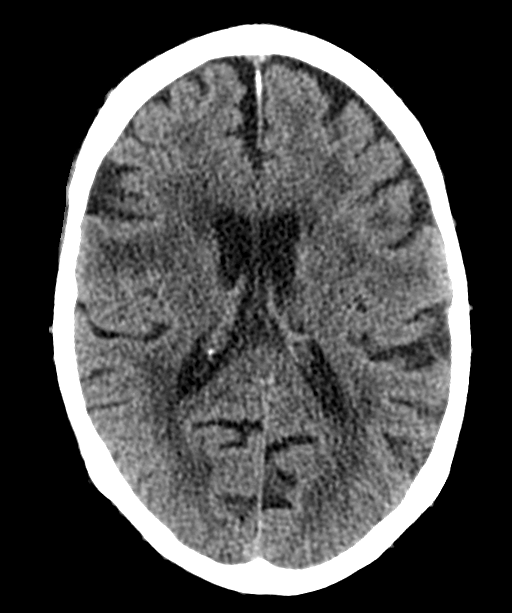
[im 30/54  bone]
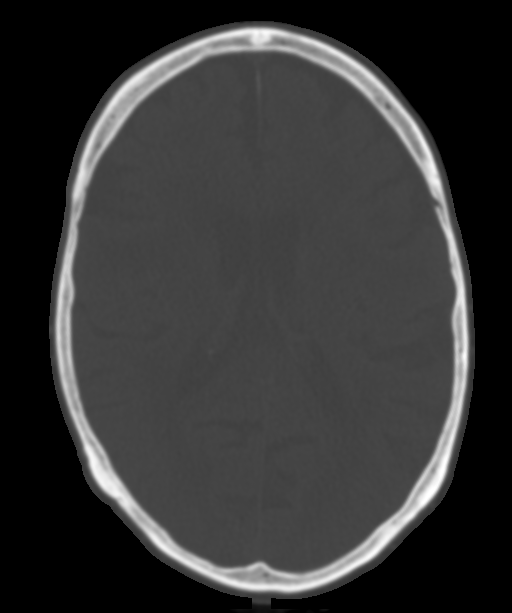
[im 36/54  brain]
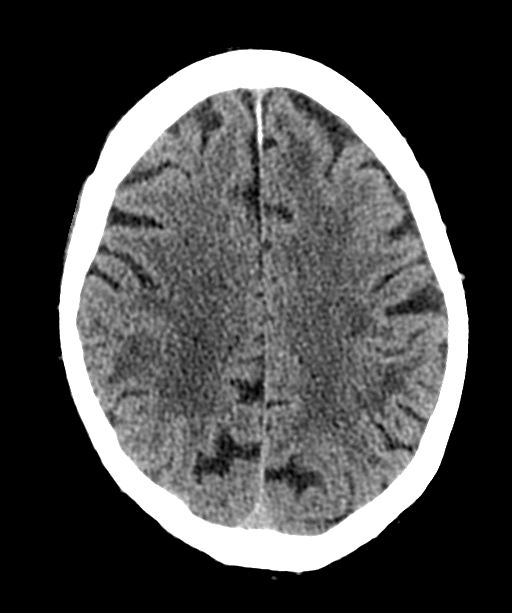
[im 42/54  brain]
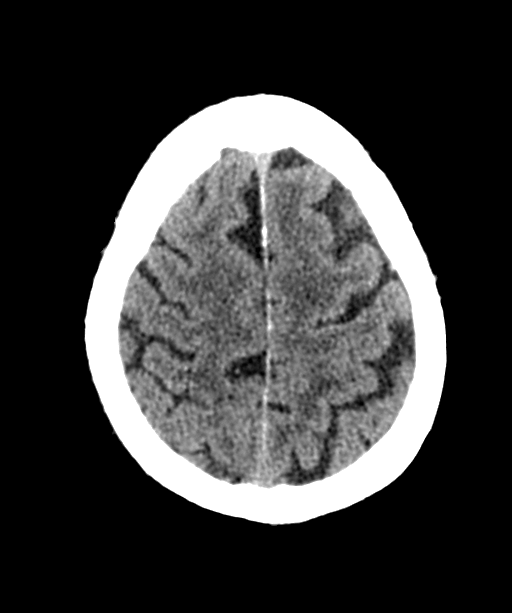
[im 48/54  brain]
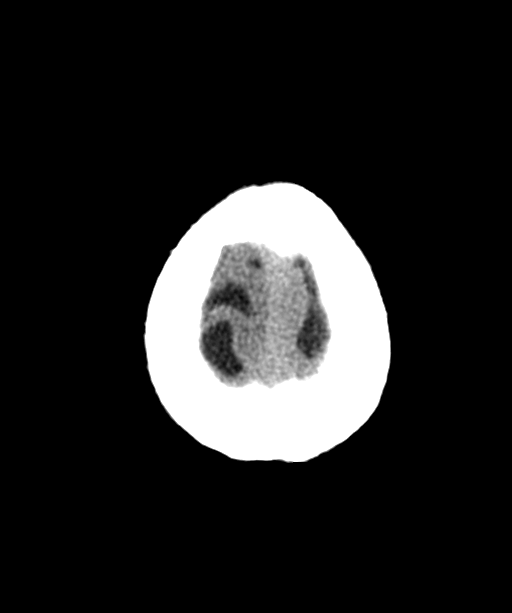

[14 of 47 positions shown; findings below may reference images not displayed]

FINDINGS: Brain: Mild cerebral atrophy. No ventricular dilatation. Diffuse low
attenuation change throughout the deep white matter likely
representing small vessel ischemia. No mass-effect or midline shift.
No abnormal extra-axial fluid collections. Gray-white matter
junctions are distinct. Basal cisterns are not effaced. No acute
intracranial hemorrhage.

Vascular: Moderate intracranial arterial vascular calcifications.

Skull: The calvarium appears intact. No acute depressed skull
fractures.

Sinuses/Orbits: Paranasal sinuses and mastoid air cells are clear.

Other: Small subcutaneous scalp hematoma over the right posterior
frontal region.
IMPRESSION: 1. No acute intracranial abnormalities.
2. Chronic atrophy and small vessel ischemic changes.

## 2021-09-15 DIAGNOSIS — G3183 Dementia with Lewy bodies: Secondary | ICD-10-CM

## 2021-09-15 DIAGNOSIS — K219 Gastro-esophageal reflux disease without esophagitis: Secondary | ICD-10-CM | POA: Diagnosis not present

## 2021-09-15 DIAGNOSIS — I10 Essential (primary) hypertension: Secondary | ICD-10-CM

## 2021-09-15 DIAGNOSIS — M1990 Unspecified osteoarthritis, unspecified site: Secondary | ICD-10-CM | POA: Diagnosis not present

## 2021-09-15 DIAGNOSIS — G20 Parkinson's disease: Secondary | ICD-10-CM | POA: Diagnosis not present

## 2021-09-15 DIAGNOSIS — F22 Delusional disorders: Secondary | ICD-10-CM | POA: Diagnosis not present

## 2021-09-28 DIAGNOSIS — L03011 Cellulitis of right finger: Secondary | ICD-10-CM | POA: Diagnosis not present

## 2021-10-02 DIAGNOSIS — L03012 Cellulitis of left finger: Secondary | ICD-10-CM | POA: Diagnosis not present

## 2021-10-06 DIAGNOSIS — L03019 Cellulitis of unspecified finger: Secondary | ICD-10-CM | POA: Diagnosis not present

## 2021-11-03 DIAGNOSIS — F22 Delusional disorders: Secondary | ICD-10-CM | POA: Diagnosis not present

## 2021-11-03 DIAGNOSIS — F0280 Dementia in other diseases classified elsewhere without behavioral disturbance: Secondary | ICD-10-CM | POA: Diagnosis not present

## 2021-11-05 ENCOUNTER — Other Ambulatory Visit: Payer: Self-pay

## 2021-11-05 ENCOUNTER — Emergency Department: Payer: Medicare Other

## 2021-11-05 ENCOUNTER — Emergency Department
Admission: EM | Admit: 2021-11-05 | Discharge: 2021-11-05 | Disposition: A | Discharge status: Skilled Nursing Facility | Payer: Medicare Other | Attending: Emergency Medicine | Admitting: Emergency Medicine

## 2021-11-05 DIAGNOSIS — Z79899 Other long term (current) drug therapy: Secondary | ICD-10-CM | POA: Insufficient documentation

## 2021-11-05 DIAGNOSIS — G20 Parkinson's disease: Secondary | ICD-10-CM | POA: Diagnosis not present

## 2021-11-05 DIAGNOSIS — Z23 Encounter for immunization: Secondary | ICD-10-CM | POA: Diagnosis not present

## 2021-11-05 DIAGNOSIS — J9601 Acute respiratory failure with hypoxia: Secondary | ICD-10-CM | POA: Diagnosis not present

## 2021-11-05 DIAGNOSIS — Z7982 Long term (current) use of aspirin: Secondary | ICD-10-CM | POA: Diagnosis not present

## 2021-11-05 DIAGNOSIS — S0101XA Laceration without foreign body of scalp, initial encounter: Secondary | ICD-10-CM | POA: Insufficient documentation

## 2021-11-05 DIAGNOSIS — Z7901 Long term (current) use of anticoagulants: Secondary | ICD-10-CM | POA: Insufficient documentation

## 2021-11-05 DIAGNOSIS — W19XXXA Unspecified fall, initial encounter: Secondary | ICD-10-CM

## 2021-11-05 DIAGNOSIS — S0990XA Unspecified injury of head, initial encounter: Secondary | ICD-10-CM | POA: Diagnosis present

## 2021-11-05 DIAGNOSIS — F0390 Unspecified dementia without behavioral disturbance: Secondary | ICD-10-CM | POA: Insufficient documentation

## 2021-11-05 DIAGNOSIS — I10 Essential (primary) hypertension: Secondary | ICD-10-CM | POA: Insufficient documentation

## 2021-11-05 DIAGNOSIS — Z20822 Contact with and (suspected) exposure to covid-19: Secondary | ICD-10-CM | POA: Diagnosis not present

## 2021-11-05 DIAGNOSIS — W1839XA Other fall on same level, initial encounter: Secondary | ICD-10-CM | POA: Diagnosis not present

## 2021-11-05 DIAGNOSIS — S0181XA Laceration without foreign body of other part of head, initial encounter: Secondary | ICD-10-CM | POA: Insufficient documentation

## 2021-11-05 LAB — CBC WITH DIFFERENTIAL/PLATELET
Abs Immature Granulocytes: 0.03 10*3/uL (ref 0.00–0.07)
Basophils Absolute: 0 10*3/uL (ref 0.0–0.1)
Basophils Relative: 0 %
Eosinophils Absolute: 0.1 10*3/uL (ref 0.0–0.5)
Eosinophils Relative: 2 %
HCT: 41.6 % (ref 36.0–46.0)
Hemoglobin: 14.1 g/dL (ref 12.0–15.0)
Immature Granulocytes: 1 %
Lymphocytes Relative: 32 %
Lymphs Abs: 1.7 10*3/uL (ref 0.7–4.0)
MCH: 31.2 pg (ref 26.0–34.0)
MCHC: 33.9 g/dL (ref 30.0–36.0)
MCV: 92 fL (ref 80.0–100.0)
Monocytes Absolute: 0.4 10*3/uL (ref 0.1–1.0)
Monocytes Relative: 7 %
Neutro Abs: 3.1 10*3/uL (ref 1.7–7.7)
Neutrophils Relative %: 58 %
Platelets: 188 10*3/uL (ref 150–400)
RBC: 4.52 MIL/uL (ref 3.87–5.11)
RDW: 13.7 % (ref 11.5–15.5)
WBC: 5.3 10*3/uL (ref 4.0–10.5)
nRBC: 0 % (ref 0.0–0.2)

## 2021-11-05 LAB — COMPREHENSIVE METABOLIC PANEL
ALT: 11 U/L (ref 0–44)
AST: 18 U/L (ref 15–41)
Albumin: 3.8 g/dL (ref 3.5–5.0)
Alkaline Phosphatase: 76 U/L (ref 38–126)
Anion gap: 4 — ABNORMAL LOW (ref 5–15)
BUN: 19 mg/dL (ref 8–23)
CO2: 27 mmol/L (ref 22–32)
Calcium: 8.7 mg/dL — ABNORMAL LOW (ref 8.9–10.3)
Chloride: 106 mmol/L (ref 98–111)
Creatinine, Ser: 0.58 mg/dL (ref 0.44–1.00)
GFR, Estimated: >60 mL/min (ref >60)
Glucose, Bld: 90 mg/dL (ref 70–99)
Potassium: 3.9 mmol/L (ref 3.5–5.1)
Sodium: 137 mmol/L (ref 135–145)
Total Bilirubin: 1 mg/dL (ref 0.3–1.2)
Total Protein: 6.7 g/dL (ref 6.5–8.1)

## 2021-11-05 LAB — URINALYSIS, COMPLETE (UACMP) WITH MICROSCOPIC
Bacteria, UA: NONE SEEN
Bilirubin Urine: NEGATIVE
Glucose, UA: NEGATIVE mg/dL
Hgb urine dipstick: NEGATIVE
Ketones, ur: NEGATIVE mg/dL
Leukocytes,Ua: NEGATIVE
Nitrite: NEGATIVE
Protein, ur: NEGATIVE mg/dL
Specific Gravity, Urine: 1.012 (ref 1.005–1.030)
pH: 7 (ref 5.0–8.0)

## 2021-11-05 LAB — TROPONIN I (HIGH SENSITIVITY): Troponin I (High Sensitivity): 6 ng/L (ref <18)

## 2021-11-05 LAB — RESP PANEL BY RT-PCR (FLU A&B, COVID) ARPGX2
Influenza A by PCR: NEGATIVE
Influenza B by PCR: NEGATIVE
SARS Coronavirus 2 by RT PCR: NEGATIVE

## 2021-11-05 LAB — CBG MONITORING, ED: Glucose-Capillary: 83 mg/dL (ref 70–99)

## 2021-11-05 LAB — BRAIN NATRIURETIC PEPTIDE: B Natriuretic Peptide: 79.2 pg/mL (ref 0.0–100.0)

## 2021-11-05 MED ORDER — IOHEXOL 350 MG/ML SOLN
75.0000 mL | Freq: Once | INTRAVENOUS | Status: AC | PRN
  Administered 2021-11-05: 75 mL via INTRAVENOUS

## 2021-11-05 MED ORDER — BACITRACIN ZINC 500 UNIT/GM EX OINT
TOPICAL_OINTMENT | Freq: Once | CUTANEOUS | Status: AC
Start: 2021-11-05 — End: 2021-11-05
  Filled 2021-11-05: qty 0.9

## 2021-11-05 MED ORDER — ACETAMINOPHEN 500 MG PO TABS
1000.0000 mg | ORAL_TABLET | Freq: Once | ORAL | Status: AC
  Administered 2021-11-05: 1000 mg via ORAL
  Filled 2021-11-05: qty 2

## 2021-11-05 MED ORDER — TETANUS-DIPHTH-ACELL PERTUSSIS 5-2.5-18.5 LF-MCG/0.5 IM SUSY
0.5000 mL | PREFILLED_SYRINGE | Freq: Once | INTRAMUSCULAR | Status: AC
  Administered 2021-11-05: 0.5 mL via INTRAMUSCULAR
  Filled 2021-11-05: qty 0.5

## 2021-11-05 MED ORDER — IPRATROPIUM-ALBUTEROL 0.5-2.5 (3) MG/3ML IN SOLN
3.0000 mL | Freq: Once | RESPIRATORY_TRACT | Status: AC
  Administered 2021-11-05: 3 mL via RESPIRATORY_TRACT
  Filled 2021-11-05: qty 3

## 2021-11-05 NOTE — ED Notes (Signed)
Patient transported to CT 

## 2021-11-05 NOTE — ED Notes (Signed)
X-ray at bedside

## 2021-11-05 NOTE — ED Provider Notes (Signed)
? ?[facility] ?Provider Note ? ? ? Event Date/Time  ? First MD Initiated Contact with Patient 11/05/21 0522   ?  (approximate) ? ? ?History  ? ?Fall ? ? ?HPI ? ?Cheryl Cummings is a 81 y.o. female with history of hypertension, Parkinson's, advanced dementia who presents to the emergency department after an unwitnessed fall from [facility] nursing home.  Patient is unable to provide any history.  Has a skin tear to the right forehead and scalp that is currently hemostatic.  It does appear that she is on Eliquis and aspirin.  Also appears she takes Lasix and different nebulizer treatments. Spoke with nurse at her nursing facility [facility] [phone] option 3, 2).  She does not wear chronically.  She has O2 prn but states that she never has to wear it. ?Oxygen was 88% on room air with EMS.  Placed on 2 L nasal cannula.  She denies chest pain or shortness of breath but it is very demented and an unreliable historian. ? ?Patient is a DNR. ? ?Son - POA Cheryl Cummings - [phone] cell, [phone] home (left messages at 6:01 AM) ? ?History provided by EMS and nursing home staff. ? ? ? ?Past Medical History:  ?Diagnosis Date  ? Essential hypertension, benign   ? GERD (gastroesophageal reflux disease)   ? Glaucoma   ? Parkinson disease (HCC)   ? ? ?Past Surgical History:  ?Procedure Laterality Date  ? INTRAMEDULLARY (IM) NAIL INTERTROCHANTERIC Left 11/03/2020  ? Procedure: INTRAMEDULLARY (IM) NAIL INTERTROCHANTRIC;  Surgeon: Christena Flake, MD;  Location: [facility] ORS;  Service: Orthopedics;  Laterality: Left;  ? LAPAROSCOPIC APPENDECTOMY N/A 03/07/2020  ? Procedure: APPENDECTOMY LAPAROSCOPIC;  Surgeon: Henrene Dodge, MD;  Location: [facility] ORS;  Service: General;  Laterality: N/A;  ? VAGINAL DELIVERY    ? twice  ? ? ?MEDICATIONS:  ?Prior to Admission medications   ?Medication Sig Start Date End Date Taking? Authorizing Provider  ?acetaminophen (TYLENOL) 500 MG tablet Take 500 mg by mouth every 4 (four)  hours as needed for mild pain or moderate pain.    [provider]  ?alum & mag hydroxide-simeth (MAALOX/MYLANTA) 200-200-20 MG/5ML suspension Take 30 mLs by mouth every 6 (six) hours as needed for indigestion or heartburn.    [provider]  ?aluminum hydroxide (DERMAGRAN) ointment Apply 1 application topically as needed. To coccyx/buttocks for skin protection    [provider]  ?bismuth subsalicylate (PEPTO BISMOL) 262 MG/15ML suspension Take 10 mLs by mouth every 6 (six) hours as needed for indigestion or diarrhea or loose stools.    [provider]  ?carbamide peroxide (DEBROX) 6.5 % OTIC solution Place 5 drops into both ears 2 (two) times daily as needed (wax buildup).    [provider]  ?carbidopa-levodopa (SINEMET IR) 25-100 MG tablet Take 1-2 tablets by mouth in the morning, at noon, in the evening, and at bedtime. 02/19/20   [provider]  ?cetirizine (ZYRTEC) 10 MG tablet Take 10 mg by mouth daily as needed for allergies.    [provider]  ?dextromethorphan-guaiFENesin (ROBITUSSIN-DM) 10-100 MG/5ML liquid Take 10 mLs by mouth every 4 (four) hours as needed for cough.    [provider]  ?docusate sodium (COLACE) 100 MG capsule Take 1 capsule (100 mg total) by mouth 2 (two) times daily. 11/06/20   [name], MD  ?dorzolamide-timolol (COSOPT) 22.3-6.8 MG/ML ophthalmic solution Place 1 drop into both eyes 2 (two) times daily.    [provider]  ?enoxaparin (LOVENOX) 40 MG/0.4ML injection Inject 0.4 mLs (40 mg total) into the skin daily. 11/06/20   [name], PA-C  ?feeding supplement (ENSURE ENLIVE / ENSURE PLUS) LIQD Take 237 mLs by mouth 2 (two) times daily between meals. 11/06/20   [name], MD  ?fluPHENAZine decanoate (PROLIXIN) 25 MG/ML injection Inject 1 mL into the muscle daily. Every 21 days 08/19/20   [provider]  ?Multiple Vitamins-Minerals (MULTIVITAMIN WITH MINERALS) tablet Take 1 tablet  by mouth daily. ?Patient not taking: Reported on 11/03/2020    [provider]  ?nitroGLYCERIN (NITROSTAT) 0.4 MG SL tablet Place 0.4 mg under the tongue every 5 (five) minutes as needed for chest pain. 01/30/20   [provider]  ?ondansetron (ZOFRAN) 4 MG tablet Take 4 mg by mouth every 8 (eight) hours as needed for nausea or vomiting.    [provider]  ?SF 5000 PLUS 1.1 % CREA dental cream Place 1 application onto teeth in the morning, at noon, and at bedtime. Brush thoroughly for 2 minutes, do not rinse after 09/29/20   [provider]  ?traMADol (ULTRAM) 50 MG tablet Take 1 tablet (50 mg total) by mouth every 6 (six) hours as needed for moderate pain. 11/06/20   [name], MD  ?TRAVOPROST, BAK FREE, OP Place 1 drop into both eyes at bedtime.    [provider]  ?Vitamin D, Ergocalciferol, (DRISDOL) 50000 units CAPS capsule Take 1 capsule (50,000 Units total) by mouth every 30 (thirty) days. ?Patient taking differently: Take 50,000 Units by mouth daily. 03/14/17   [name], MD  ? ? ?Physical Exam  ? ?Triage Vital Signs: ?ED Triage Vitals  ?Enc Vitals Group  ?   BP 11/05/21 0506 (!) 159/105  ?   Pulse Rate 11/05/21 0506 91  ?   Resp 11/05/21 0506 (!) 22  ?   Temp 11/05/21 0506 98.4 ?F (36.9 ?C)  ?   Temp src --   ?   SpO2 11/05/21 0502 95 %  ?   Weight 11/05/21 0504 118 lb (53.5 kg)  ?   Height 11/05/21 0504 5\' 4"  (1.626 m)  ?   Head Circumference --   ?   Peak Flow --   ?   Pain Score --   ?   Pain Loc --   ?   Pain Edu? --   ?   Excl. in GC? --   ? ? ?Most recent vital signs: ?Vitals:  ? 11/05/21 0502 11/05/21 0506  ?BP:  (!) 159/105  ?Pulse:  91  ?Resp:  (!) 22  ?Temp:  98.4 ?F (36.9 ?C)  ?SpO2: 95% 92%  ? ? ? ?CONSTITUTIONAL: Alert and oriented to person and place but not time or situation.  Chronically ill-appearing.  Elderly. ?HEAD: Normocephalic; dried blood matted into the right side of her hair.  Superficial skin tear to the right forehead and scalp  that is not actively bleeding. ?EYES: Conjunctivae clear, PERRL, EOMI ?ENT: normal nose; no rhinorrhea; moist mucous membranes; pharynx without lesions noted; no dental injury; no septal hematoma, no epistaxis; no facial deformity or bony tenderness ?NECK: Supple, no midline spinal tenderness, step-off or deformity; trachea midline ?CARD: RRR; S1 and S2 appreciated; no murmurs, no clicks, no rubs, no gallops ?RESP: Normal chest excursion without splinting or tachypnea; breath sounds clear and equal bilaterally; no wheezes, no rhonchi, no rales; no hypoxia or respiratory distress ?CHEST:  chest wall stable, no crepitus or ecchymosis or deformity,  nontender to palpation; no flail chest ?ABD/GI: Normal bowel sounds; non-distended; soft, non-tender, no rebound, no guarding; no ecchymosis or other lesions noted ?PELVIS:  stable, nontender to palpation ?BACK:  The back appears normal; no midline spinal tenderness, step-off or deformity, patient has significant kyphosis ?EXT: She points to both of her knees hurting but no bony tenderness, deformity, effusion.  2+ DP and radial pulses bilaterally.  Normal ROM in all joints; otherwise extremities are non-tender to palpation; no edema; normal capillary refill; no cyanosis, no bony tenderness or bony deformity of patient's extremities, no joint effusion, compartments are soft, extremities are warm and well-perfused, no ecchymosis ?SKIN: Normal color for age and race; warm ?NEURO: No facial asymmetry, normal speech, moving all extremities equally ? ?ED Results / Procedures / Treatments  ? ?LABS: ?(all labs ordered are listed, but only abnormal results are displayed) ?Labs Reviewed  ?COMPREHENSIVE METABOLIC PANEL - Abnormal; Notable for the following components:  ?    Result Value  ? Calcium 8.7 (*)   ? Anion gap 4 (*)   ? All other components within normal limits  ?RESP PANEL BY RT-PCR (FLU A&B, COVID) ARPGX2  ?CBC WITH DIFFERENTIAL/PLATELET  ?BRAIN NATRIURETIC PEPTIDE   ?URINALYSIS, COMPLETE (UACMP) WITH MICROSCOPIC  ?CBG MONITORING, ED  ?TROPONIN I (HIGH SENSITIVITY)  ? ? ? ?EKG: ? EKG Interpretation ? ?Date/Time:  Sunday November 05 2021 06:25:08 EDT ?Ventricular Rate:  81 ?PR Int

## 2021-11-05 NOTE — ED Notes (Signed)
Pt bed alarm turned on ?

## 2021-11-05 NOTE — Discharge Instructions (Signed)
Your CAT scans and x-rays did not show any acute traumatic injuries.  Your oxygen was borderline but we did not find any pneumonia or blood clot in your lungs to explain this.  Please follow-up with your primary care provider as scheduled. ?

## 2021-11-05 NOTE — ED Triage Notes (Signed)
Arrives EMS from [facility] after unwitnessed fall. Laceration on right side of head. Also endorses generalized right sided body pains. Confused at baseline. Denies LOC. Takes Plavix.  ? ?88% room air - 95% on 2L [place]  ?155/101 ?80hr  ?20rr ?Cbg 108 ?

## 2021-11-05 NOTE — ED Provider Notes (Signed)
Patient was signed out to me at 62 AM.  81 year old female presents after an unwitnessed fall.  Work-up here is overall been unremarkable no traumatic injuries and labs clean troponin and BNP all reassuring.  Did become hypoxic briefly required 2 L nasal cannula but is now off oxygen.  Given the brief hypoxia a CTA was ordered and is pending at the time of signout.  Plan is for DC back to facility if CTA does not show any obvious explanation for her hypoxia.  Patient apparently does have orders for O2 at the facility but does not typically require it. ? ?CTA is negative for acute PE pneumonia or any other acute thoracic process to explain her oxygen.  Sats around 90% on room air.  Will discharge back to facility. ?  ?Georga Hacking, MD ?11/05/21 [phone] ? ?

## 2021-11-05 NOTE — ED Notes (Addendum)
Pts head cleaned up. 1 inch lacaertion/skin tear noted to right forehead. Bacitracin ointment placed on laceration. Wound covered with clean gauze.  ?

## 2021-11-05 NOTE — ED Notes (Signed)
Patient dressed into gown since nightgown was soaked in blood. Patients brief changed due to incontinence of urine by this RN and  Fransisco Beau ?

## 2021-11-07 DIAGNOSIS — L03011 Cellulitis of right finger: Secondary | ICD-10-CM | POA: Diagnosis not present

## 2021-11-10 DIAGNOSIS — B351 Tinea unguium: Secondary | ICD-10-CM | POA: Diagnosis not present

## 2021-11-16 DIAGNOSIS — F22 Delusional disorders: Secondary | ICD-10-CM | POA: Diagnosis not present

## 2021-11-16 DIAGNOSIS — F0280 Dementia in other diseases classified elsewhere without behavioral disturbance: Secondary | ICD-10-CM | POA: Diagnosis not present

## 2021-11-16 DIAGNOSIS — G20 Parkinson's disease: Secondary | ICD-10-CM | POA: Diagnosis not present

## 2021-11-16 DIAGNOSIS — M159 Polyosteoarthritis, unspecified: Secondary | ICD-10-CM | POA: Diagnosis not present

## 2021-11-29 ENCOUNTER — Telehealth: Payer: Self-pay

## 2021-11-29 NOTE — Telephone Encounter (Signed)
This dentist called stating pt seems to have had some mental status changes and wanted Dr Alphonsus Sias advise what to do. I advised her that she used to be a pt at [facility], but there are no notes in the chart since May 2022. She said she would have the pt go back to facility with a note that she feels pt has had some mental status changes and have someone there arrange for Dr Alphonsus Sias or Rene Kocher to see her or send her to the ER. I suggested she call [facility] but she said she would just send a note with her. ?

## 2022-01-01 DIAGNOSIS — H02843 Edema of right eye, unspecified eyelid: Secondary | ICD-10-CM | POA: Diagnosis not present

## 2022-01-31 DIAGNOSIS — G3183 Dementia with Lewy bodies: Secondary | ICD-10-CM

## 2022-01-31 DIAGNOSIS — M1990 Unspecified osteoarthritis, unspecified site: Secondary | ICD-10-CM | POA: Diagnosis not present

## 2022-01-31 DIAGNOSIS — G20 Parkinson's disease: Secondary | ICD-10-CM | POA: Diagnosis not present

## 2022-01-31 DIAGNOSIS — I10 Essential (primary) hypertension: Secondary | ICD-10-CM

## 2022-01-31 DIAGNOSIS — F22 Delusional disorders: Secondary | ICD-10-CM | POA: Diagnosis not present

## 2022-01-31 DIAGNOSIS — B351 Tinea unguium: Secondary | ICD-10-CM | POA: Diagnosis not present

## 2022-01-31 DIAGNOSIS — K219 Gastro-esophageal reflux disease without esophagitis: Secondary | ICD-10-CM | POA: Diagnosis not present

## 2022-03-15 ENCOUNTER — Encounter: Payer: Self-pay | Admitting: Nurse Practitioner

## 2022-03-15 DIAGNOSIS — F22 Delusional disorders: Secondary | ICD-10-CM

## 2022-03-15 DIAGNOSIS — B351 Tinea unguium: Secondary | ICD-10-CM

## 2022-04-02 DIAGNOSIS — G20 Parkinson's disease: Secondary | ICD-10-CM | POA: Diagnosis not present

## 2022-04-02 DIAGNOSIS — F22 Delusional disorders: Secondary | ICD-10-CM | POA: Diagnosis not present

## 2022-04-02 DIAGNOSIS — M159 Polyosteoarthritis, unspecified: Secondary | ICD-10-CM | POA: Diagnosis not present

## 2022-04-02 DIAGNOSIS — F0280 Dementia in other diseases classified elsewhere without behavioral disturbance: Secondary | ICD-10-CM | POA: Diagnosis not present

## 2022-04-23 ENCOUNTER — Encounter: Payer: Self-pay | Admitting: Student

## 2022-04-23 ENCOUNTER — Non-Acute Institutional Stay (SKILLED_NURSING_FACILITY): Payer: Medicare Other | Admitting: Student

## 2022-04-23 DIAGNOSIS — L308 Other specified dermatitis: Secondary | ICD-10-CM

## 2022-04-23 DIAGNOSIS — Z66 Do not resuscitate: Secondary | ICD-10-CM

## 2022-04-23 NOTE — Progress Notes (Signed)
Location:  Other [facility]) Nursing Home Room Number: 204-A Place of Service:  SNF [id number]) Provider: Dewayne Shorter, MD  Patient Care Team: Dewayne Shorter, MD as PCP - General (Family Medicine) Lauree Chandler, NP as Nurse Practitioner (Geriatric Medicine)  Extended Emergency Contact Information Primary Emergency Contact: [facility] Address: [address]          [place], [place] [address] Johnnette Litter of [place] Phone: [phone] Work Phone: [phone] Mobile Phone: [phone] Relation: Daughter Secondary Emergency Contact: Schneller,[name] Address: [address]          [place], [place] [address] Johnnette Litter of [place] Phone: [phone] Work Phone: [phone] Mobile Phone: [phone] Relation: Son  Code Status:  DNR Goals of care: Advanced Directive information    11/05/2021    5:05 AM  Advanced Directives  Does Patient Have a Medical Advance Directive? No  Would patient like information on creating a medical advance directive? No - Patient declined     Chief Complaint  Patient presents with   Acute Visit    Rash     HPI:  Pt is a 81 y.o. female seen today for an acute visit for a new rash. Patient is not ineractive and does not answer questions. Nursing noted a pustular rash of the forehead yesterday. Patient has no allergies. Recently started antibiotics for UTI.    Past Medical History:  Diagnosis Date   Dementia ([place])    Essential hypertension, benign    GERD (gastroesophageal reflux disease)    Glaucoma    Parkinson disease    Past Surgical History:  Procedure Laterality Date   INTRAMEDULLARY (IM) NAIL INTERTROCHANTERIC Left 11/03/2020   Procedure: INTRAMEDULLARY (IM) NAIL INTERTROCHANTRIC;  Surgeon: Corky Mull, MD;  Location: [facility] ORS;  Service: Orthopedics;  Laterality: Left;   LAPAROSCOPIC APPENDECTOMY N/A 03/07/2020   Procedure: APPENDECTOMY LAPAROSCOPIC;  Surgeon: Olean Ree, MD;  Location: [facility] ORS;  Service: General;   Laterality: N/A;   VAGINAL DELIVERY     twice    Allergies  Allergen Reactions   Nsaids    Demerol [Meperidine Hcl]    Diazepam Other (See Comments)    Unknown  Other reaction(s): Unknown Unknown Other Reaction: OTHER REACTION   Ibuprofen Swelling   Meperidine Other (See Comments)    Other Reaction: OTHER REACTION   Mushroom Extract Complex Nausea And Vomiting   Shellfish Allergy Rash    Outpatient Encounter Medications as of 04/23/2022  Medication Sig   acetaminophen (TYLENOL) 650 MG CR tablet Take 650 mg by mouth every 4 (four) hours as needed for pain.   aluminum hydroxide (DERMAGRAN) ointment Apply 1 application  topically as directed. To coccyx/buttocks for skin protection every shift   amoxicillin-clavulanate (AUGMENTIN) 875-125 MG tablet Take 1 tablet by mouth 2 (two) times daily.   bismuth subsalicylate (PEPTO BISMOL) 262 MG/15ML suspension Take 30 mLs by mouth every 6 (six) hours as needed for indigestion or diarrhea or loose stools.   carbidopa-levodopa (SINEMET IR) 25-100 MG tablet Take 1 tablet by mouth 3 (three) times daily. And takes 2 tablets once daily   clindamycin (CLEOCIN T) 1 % external solution Apply 1 Application topically as needed. Every shift for rash x 10 days   Eyelid Cleansers (OCUSOFT EYELID CLEANSING) PADS Apply 1 Application topically daily. For matted eyes   fluPHENAZine decanoate (PROLIXIN) 25 MG/ML injection Inject 2 mLs into the muscle daily. Every 21 days   ketoconazole (NIZORAL) 2 % shampoo Apply 1 Application topically 2 (two) times a week.  For dandruff every Tuesday and Friday   latanoprost (XALATAN) 0.005 % ophthalmic solution Place 1 drop into both eyes at bedtime.   LORazepam (ATIVAN) 0.5 MG tablet Take 0.5 mg by mouth every 12 (twelve) hours as needed for anxiety.   nitroGLYCERIN (NITROSTAT) 0.4 MG SL tablet Place 0.4 mg under the tongue every 5 (five) minutes as needed for chest pain.   polyvinyl alcohol (ARTIFICIAL TEARS) 1.4 %  ophthalmic solution Place 1 drop into both eyes as needed for dry eyes.   SF 5000 PLUS 1.1 % CREA dental cream Place 1 application onto teeth in the morning, at noon, and at bedtime. Brush thoroughly for 2 minutes, do not rinse after   timolol (TIMOPTIC) 0.5 % ophthalmic solution Place 1 drop into both eyes daily.   Vitamin D, Ergocalciferol, (DRISDOL) 50000 units CAPS capsule Take 1 capsule (50,000 Units total) by mouth every 30 (thirty) days.   No facility-administered encounter medications on file as of 04/23/2022.    Review of Systems  Unable to perform ROS: Dementia   Immunization History  Administered Date(s) Administered   Tdap 09/14/2007, 11/05/2021   Pertinent  Health Maintenance Due  Topic Date Due   DEXA SCAN  Never done   INFLUENZA VACCINE  Never done      11/05/2020    4:00 AM 11/05/2020    7:40 AM 11/06/2020    4:00 AM 11/06/2020    7:52 AM 11/05/2021    5:05 AM  Fall Risk  Patient Fall Risk Level High fall risk High fall risk High fall risk High fall risk High fall risk   Functional Status Survey:    Vitals:   04/23/22 1556  BP: 110/72  Pulse: 83  Resp: 18  Temp: 97.8 F (36.6 C)  SpO2: 95%  Weight: 108 lb (49 kg)  Height: 5\' 4"  (1.626 m)   Body mass index is 18.54 kg/m. Physical Exam Constitutional:      Comments: Cachectic  Skin:    Comments: Forehead full of numerous pustules of the forehead.   Neurological:     Mental Status: She is alert.   Labs reviewed: Recent Labs    11/05/21 0514  NA 137  K 3.9  CL 106  CO2 27  GLUCOSE 90  BUN 19  CREATININE 0.58  CALCIUM 8.7*   Recent Labs    11/05/21 0514  AST 18  ALT 11  ALKPHOS 76  BILITOT 1.0  PROT 6.7  ALBUMIN 3.8   Recent Labs    11/05/21 0514  WBC 5.3  NEUTROABS 3.1  HGB 14.1  HCT 41.6  MCV 92.0  PLT 188   Lab Results  Component Value Date   TSH 1.103 03/02/2016   No results found for: "HGBA1C" No results found for: "CHOL", "HDL", "LDLCALC", "LDLDIRECT", "TRIG",  "CHOLHDL"  Significant Diagnostic Results in last 30 days:  No results found.  Assessment/Plan 1. Do not resuscitate Patient remains DNR.   2. Pustular dermatitis Patient has been on abx for UTI. No history of allergies to medications. Patient's vital signs remain stable. Will plan for topical cleocin for the next 10 days.     Family/ staff Communication: nursing  Labs/tests ordered:  none  03/04/2016, MD, [facility] [address] [phone]

## 2022-05-10 ENCOUNTER — Non-Acute Institutional Stay (SKILLED_NURSING_FACILITY): Payer: Medicare Other | Admitting: Nurse Practitioner

## 2022-05-10 ENCOUNTER — Encounter: Payer: Self-pay | Admitting: Nurse Practitioner

## 2022-05-10 DIAGNOSIS — G20A1 Parkinson's disease without dyskinesia, without mention of fluctuations: Secondary | ICD-10-CM

## 2022-05-10 DIAGNOSIS — L308 Other specified dermatitis: Secondary | ICD-10-CM | POA: Diagnosis not present

## 2022-05-10 DIAGNOSIS — F03C0 Unspecified dementia, severe, without behavioral disturbance, psychotic disturbance, mood disturbance, and anxiety: Secondary | ICD-10-CM | POA: Diagnosis not present

## 2022-05-10 DIAGNOSIS — E559 Vitamin D deficiency, unspecified: Secondary | ICD-10-CM

## 2022-05-10 NOTE — Progress Notes (Signed)
Location:  Other Nursing Home Room Number: 956 A Place of Service:  SNF ([id number])  Dewayne Shorter, MD  Patient Care Team: Dewayne Shorter, MD as PCP - General (Family Medicine) Dewaine Oats Carlos American, NP as Nurse Practitioner (Geriatric Medicine)  Extended Emergency Contact Information Primary Emergency Contact: [facility] Address: [address]          [place], [place] [address] Johnnette Litter of [place] Phone: [phone] Work Phone: [phone] Mobile Phone: [phone] Relation: Daughter Secondary Emergency Contact: Klees,[name] Address: [address]          [place],  [address] Johnnette Litter of [place] Phone: [phone] Work Phone: [phone] Mobile Phone: [phone] Relation: Son  Goals of care: Advanced Directive information    05/10/2022   12:05 PM  Advanced Directives  Does Patient Have a Medical Advance Directive? Yes  Type of Advance Directive Out of facility DNR (pink MOST or yellow form)  Does patient want to make changes to medical advance directive? No - Patient declined     Chief Complaint  Patient presents with   Medical Management of Chronic Issues    Routine follow up   Quality Metric Gaps    Medicare annual wellness,dexa scan   Immunizations    COVID vaccine, shingrix vaccine, pneumonia vaccine and flu vaccine due    HPI:  Pt is a 81 y.o. female seen today for routine follow up. Pt with hx of dementia, htn, GERD and parkinsons diease requiring long term care at skilled facility.  Nursing reports dermatitis to forehead resolved but has now returned over the last few days.  No other skin concerns.  Eating with assistance weight has been stable in the last month, some loss noted over the last 3 months.  Otherwise she has been stable. Staff helps with ADLS   Past Medical History:  Diagnosis Date   Dementia ([place])    Essential hypertension, benign    GERD (gastroesophageal reflux disease)    Glaucoma    Parkinson disease    Past  Surgical History:  Procedure Laterality Date   INTRAMEDULLARY (IM) NAIL INTERTROCHANTERIC Left 11/03/2020   Procedure: INTRAMEDULLARY (IM) NAIL INTERTROCHANTRIC;  Surgeon: Corky Mull, MD;  Location: [facility];  Service: Orthopedics;  Laterality: Left;   LAPAROSCOPIC APPENDECTOMY N/A 03/07/2020   Procedure: APPENDECTOMY LAPAROSCOPIC;  Surgeon: Olean Ree, MD;  Location: [facility];  Service: General;  Laterality: N/A;   VAGINAL DELIVERY     twice    Allergies  Allergen Reactions   Nsaids    Demerol [Meperidine Hcl]    Diazepam Other (See Comments)    Unknown  Other reaction(s): Unknown Unknown Other Reaction: OTHER REACTION   Ibuprofen Swelling   Meperidine Other (See Comments)    Other Reaction: OTHER REACTION   Mushroom Extract Complex Nausea And Vomiting   Shellfish Allergy Rash    Outpatient Encounter Medications as of 05/10/2022  Medication Sig   acetaminophen (TYLENOL) 650 MG CR tablet Take 650 mg by mouth every 4 (four) hours as needed for pain.   aluminum hydroxide (DERMAGRAN) ointment Apply 1 application  topically as directed. To coccyx/buttocks for skin protection every shift   bismuth subsalicylate (PEPTO BISMOL) 262 MG/15ML suspension Take 30 mLs by mouth every 6 (six) hours as needed for indigestion or diarrhea or loose stools.   carbidopa-levodopa (SINEMET IR) 25-100 MG tablet Take 1 tablet by mouth 3 (three) times daily. And takes 2 tablets once daily   Eyelid Cleansers (OCUSOFT EYELID CLEANSING) PADS Apply 1 Application topically  daily. For matted eyes   fluPHENAZine decanoate (PROLIXIN) 25 MG/ML injection Inject 2 mLs into the muscle daily. Every 21 days   ketoconazole (NIZORAL) 2 % shampoo Apply 1 Application topically 2 (two) times a week. For dandruff every Tuesday and Friday   latanoprost (XALATAN) 0.005 % ophthalmic solution Place 1 drop into both eyes at bedtime.   LORazepam (ATIVAN) 0.5 MG tablet Take 0.5 mg by mouth every 12 (twelve) hours as needed for  anxiety.   nitroGLYCERIN (NITROSTAT) 0.4 MG SL tablet Place 0.4 mg under the tongue every 5 (five) minutes as needed for chest pain.   polyvinyl alcohol (ARTIFICIAL TEARS) 1.4 % ophthalmic solution Place 1 drop into both eyes as needed for dry eyes.   SF 5000 PLUS 1.1 % CREA dental cream Place 1 application onto teeth in the morning, at noon, and at bedtime. Brush thoroughly for 2 minutes, do not rinse after   timolol (TIMOPTIC) 0.5 % ophthalmic solution Place 1 drop into both eyes daily.   Vitamin D, Ergocalciferol, (DRISDOL) 50000 units CAPS capsule Take 1 capsule (50,000 Units total) by mouth every 30 (thirty) days.   [DISCONTINUED] amoxicillin-clavulanate (AUGMENTIN) 875-125 MG tablet Take 1 tablet by mouth 2 (two) times daily.   [DISCONTINUED] clindamycin (CLEOCIN T) 1 % external solution Apply 1 Application topically as needed. Every shift for rash x 10 days   No facility-administered encounter medications on file as of 05/10/2022.    Review of Systems  Unable to perform ROS: Dementia    Immunization History  Administered Date(s) Administered   Influenza-Unspecified 05/01/2022   Moderna Sars-Covid-2 Vaccination 08/24/2019, 09/21/2019   Tdap 09/14/2007, 11/05/2021   Unspecified SARS-COV-2 Vaccination 05/27/2020, 12/02/2020, 04/07/2021, 12/12/2021   Pertinent  Health Maintenance Due  Topic Date Due   DEXA SCAN  Never done   INFLUENZA VACCINE  Completed      11/05/2020    4:00 AM 11/05/2020    7:40 AM 11/06/2020    4:00 AM 11/06/2020    7:52 AM 11/05/2021    5:05 AM  Fall Risk  Patient Fall Risk Level High fall risk High fall risk High fall risk High fall risk High fall risk   Functional Status Survey:    Vitals:   05/10/22 1159  BP: 110/72  Pulse: 83  Resp: 18  Temp: (!) 97 F (36.1 C)  SpO2: 95%  Weight: 108 lb 12.8 oz (49.4 kg)  Height: 5\' 4"  (1.626 m)   Body mass index is 18.68 kg/m. Physical Exam Constitutional:      General: She is not in acute distress.     Appearance: She is well-developed. She is not diaphoretic.  HENT:     Head: Normocephalic and atraumatic.     Mouth/Throat:     Pharynx: No oropharyngeal exudate.  Eyes:     Conjunctiva/sclera: Conjunctivae normal.     Pupils: Pupils are equal, round, and reactive to light.  Cardiovascular:     Rate and Rhythm: Normal rate and regular rhythm.     Heart sounds: Normal heart sounds.  Pulmonary:     Effort: Pulmonary effort is normal.     Breath sounds: Normal breath sounds.  Abdominal:     General: Bowel sounds are normal.     Palpations: Abdomen is soft.  Musculoskeletal:     Cervical back: Normal range of motion and neck supple.     Right lower leg: No edema.     Left lower leg: No edema.  Skin:    General:  Skin is warm and dry.     Comments: Forehead with small pustule lesions scattered.   Neurological:     Mental Status: She is alert. Mental status is at baseline.     Gait: Gait abnormal.  Psychiatric:        Mood and Affect: Mood normal.        Cognition and Memory: Cognition is impaired. Memory is impaired. She exhibits impaired recent memory and impaired remote memory.     Labs reviewed: Recent Labs    11/05/21 0514  NA 137  K 3.9  CL 106  CO2 27  GLUCOSE 90  BUN 19  CREATININE 0.58  CALCIUM 8.7*   Recent Labs    11/05/21 0514  AST 18  ALT 11  ALKPHOS 76  BILITOT 1.0  PROT 6.7  ALBUMIN 3.8   Recent Labs    11/05/21 0514  WBC 5.3  NEUTROABS 3.1  HGB 14.1  HCT 41.6  MCV 92.0  PLT 188   Lab Results  Component Value Date   TSH 1.103 03/02/2016   No results found for: "HGBA1C" No results found for: "CHOL", "HDL", "LDLCALC", "LDLDIRECT", "TRIG", "CHOLHDL"  Significant Diagnostic Results in last 30 days:  No results found.  Assessment/Plan 1. Pustular dermatitis Will restart cleocin 1% soln to forehead for 20 days  2. Severe dementia without behavioral disturbance, psychotic disturbance, mood disturbance, or anxiety, unspecified dementia  type (HCC) -Stable, no acute changes in cognitive or functional status, continue supportive care.   3. Parkinson's disease without fluctuating manifestations, unspecified whether dyskinesia present -symptoms controlled on sinemet   4. Vitamin D deficiency -continues on supplement    Lenin Kuhnle [facility] [phone]

## 2022-05-10 NOTE — Progress Notes (Deleted)
Location:  [facility] Room Number: 204 A Place of Service:  SNF [phone]) Provider:  Earnestine Mealing, MD  Earnestine Mealing, MD  Patient Care Team: Earnestine Mealing, MD as PCP - General (Family Medicine) Sharon Seller, NP as Nurse Practitioner (Geriatric Medicine)  Extended Emergency Contact Information Primary Emergency Contact: [facility] Address: [address]          [place], [place] [address] [name] of [place] Home Phone: [phone] Work Phone: [phone] Mobile Phone: [phone] Relation: Daughter Secondary Emergency Contact: Lindenberger,[name] Address: [address]          [place], [place] [address] [name] of [place] Home Phone: [phone] Work Phone: [phone] Mobile Phone: [phone] Relation: Son  Code Status:  DNR Goals of care: Advanced Directive information    05/10/2022   12:05 PM  Advanced Directives  Does Patient Have a Medical Advance Directive? Yes  Type of Advance Directive Out of facility DNR (pink MOST or yellow form)  Does patient want to make changes to medical advance directive? No - Patient declined     Chief Complaint  Patient presents with   Medical Management of Chronic Issues    Routine follow up   Quality Metric Gaps    Medicare annual wellness,dexa scan   Immunizations    COVID vaccine, shingrix vaccine, pneumonia vaccine and flu vaccine due    HPI:  Pt is a 81 y.o. female seen today for medical management of chronic diseases.     Past Medical History:  Diagnosis Date   Dementia ([facility])    Essential hypertension, benign    GERD (gastroesophageal reflux disease)    Glaucoma    Parkinson disease    Past Surgical History:  Procedure Laterality Date   INTRAMEDULLARY (IM) NAIL INTERTROCHANTERIC Left 11/03/2020   Procedure: INTRAMEDULLARY (IM) NAIL INTERTROCHANTRIC;  Surgeon: Christena Flake, MD;  Location: [facility];  Service: Orthopedics;  Laterality: Left;   LAPAROSCOPIC APPENDECTOMY  N/A 03/07/2020   Procedure: APPENDECTOMY LAPAROSCOPIC;  Surgeon: Henrene Dodge, MD;  Location: [facility];  Service: General;  Laterality: N/A;   VAGINAL DELIVERY     twice    Allergies  Allergen Reactions   Nsaids    Demerol [Meperidine Hcl]    Diazepam Other (See Comments)    Unknown  Other reaction(s): Unknown Unknown Other Reaction: OTHER REACTION   Ibuprofen Swelling   Meperidine Other (See Comments)    Other Reaction: OTHER REACTION   Mushroom Extract Complex Nausea And Vomiting   Shellfish Allergy Rash    Allergies as of 05/10/2022       Reactions   Nsaids    Demerol [meperidine Hcl]    Diazepam Other (See Comments)   Unknown Other reaction(s): Unknown Unknown Other Reaction: OTHER REACTION   Ibuprofen Swelling   Meperidine Other (See Comments)   Other Reaction: OTHER REACTION   Mushroom Extract Complex Nausea And Vomiting   Shellfish Allergy Rash        Medication List        Accurate as of May 10, 2022 12:18 PM. If you have any questions, ask your nurse or doctor.          STOP taking these medications    amoxicillin-clavulanate 875-125 MG tablet Commonly known as: AUGMENTIN Stopped by: Sharon Seller, NP   clindamycin 1 % external solution Commonly known as: CLEOCIN T Stopped by: Sharon Seller, NP       TAKE these medications    acetaminophen 650  MG CR tablet Commonly known as: TYLENOL Take 650 mg by mouth every 4 (four) hours as needed for pain.   Artificial Tears 1.4 % ophthalmic solution Generic drug: polyvinyl alcohol Place 1 drop into both eyes as needed for dry eyes.   bismuth subsalicylate 220 UR/42HC suspension Commonly known as: PEPTO BISMOL Take 30 mLs by mouth every 6 (six) hours as needed for indigestion or diarrhea or loose stools.   carbidopa-levodopa 25-100 MG tablet Commonly known as: SINEMET IR Take 1 tablet by mouth 3 (three) times daily. And takes 2 tablets once daily   Dermagran ointment Generic  drug: aluminum hydroxide Apply 1 application  topically as directed. To coccyx/buttocks for skin protection every shift   fluPHENAZine decanoate 25 MG/ML injection Commonly known as: PROLIXIN Inject 2 mLs into the muscle daily. Every 21 days   ketoconazole 2 % shampoo Commonly known as: NIZORAL Apply 1 Application topically 2 (two) times a week. For dandruff every Tuesday and Friday   latanoprost 0.005 % ophthalmic solution Commonly known as: XALATAN Place 1 drop into both eyes at bedtime.   LORazepam 0.5 MG tablet Commonly known as: ATIVAN Take 0.5 mg by mouth every 12 (twelve) hours as needed for anxiety.   nitroGLYCERIN 0.4 MG SL tablet Commonly known as: NITROSTAT Place 0.4 mg under the tongue every 5 (five) minutes as needed for chest pain.   OcuSoft Eyelid Cleansing Pads Apply 1 Application topically daily. For matted eyes   SF 5000 Plus 1.1 % Crea dental cream Generic drug: sodium fluoride Place 1 application onto teeth in the morning, at noon, and at bedtime. Brush thoroughly for 2 minutes, do not rinse after   timolol 0.5 % ophthalmic solution Commonly known as: TIMOPTIC Place 1 drop into both eyes daily.   Vitamin D (Ergocalciferol) 1.25 MG (50000 UNIT) Caps capsule Commonly known as: DRISDOL Take 1 capsule (50,000 Units total) by mouth every 30 (thirty) days.        Review of Systems  Immunization History  Administered Date(s) Administered   Influenza-Unspecified 05/01/2022   Moderna Sars-Covid-2 Vaccination 08/24/2019, 09/21/2019   Tdap 09/14/2007, 11/05/2021   Unspecified SARS-COV-2 Vaccination 05/27/2020, 12/02/2020, 04/07/2021, 12/12/2021   Pertinent  Health Maintenance Due  Topic Date Due   DEXA SCAN  Never done   INFLUENZA VACCINE  Completed      11/05/2020    4:00 AM 11/05/2020    7:40 AM 11/06/2020    4:00 AM 11/06/2020    7:52 AM 11/05/2021    5:05 AM  Fall Risk  Patient Fall Risk Level High fall risk High fall risk High fall risk High  fall risk High fall risk   Functional Status Survey:    Vitals:   05/10/22 1159  BP: 110/72  Pulse: 83  Resp: 18  Temp: (!) 97 F (36.1 C)  SpO2: 95%  Weight: 108 lb 12.8 oz (49.4 kg)  Height: 5\' 4"  (1.626 m)   Body mass index is 18.68 kg/m. Physical Exam  Labs reviewed: Recent Labs    11/05/21 0514  NA 137  K 3.9  CL 106  CO2 27  GLUCOSE 90  BUN 19  CREATININE 0.58  CALCIUM 8.7*   Recent Labs    11/05/21 0514  AST 18  ALT 11  ALKPHOS 76  BILITOT 1.0  PROT 6.7  ALBUMIN 3.8   Recent Labs    11/05/21 0514  WBC 5.3  NEUTROABS 3.1  HGB 14.1  HCT 41.6  MCV 92.0  PLT 188  Lab Results  Component Value Date   TSH 1.103 03/02/2016   No results found for: "HGBA1C" No results found for: "CHOL", "HDL", "LDLCALC", "LDLDIRECT", "TRIG", "CHOLHDL"  Significant Diagnostic Results in last 30 days:  No results found.  Assessment/Plan There are no diagnoses linked to this encounter.   Family/ staff Communication:   Labs/tests ordered:

## 2022-07-10 ENCOUNTER — Non-Acute Institutional Stay (SKILLED_NURSING_FACILITY): Payer: Medicare Other | Admitting: Adult Health

## 2022-07-10 DIAGNOSIS — G20A1 Parkinson's disease without dyskinesia, without mention of fluctuations: Secondary | ICD-10-CM | POA: Diagnosis not present

## 2022-07-10 DIAGNOSIS — U071 COVID-19: Secondary | ICD-10-CM | POA: Diagnosis not present

## 2022-07-10 NOTE — Progress Notes (Signed)
Location:   [facility] Room Number: 204 A Place of Service:  SNF [phone]) Provider:  Kenard Gower, NP  Earnestine Mealing, MD  Patient Care Team: Earnestine Mealing, MD as PCP - General (Family Medicine) Sharon Seller, NP as Nurse Practitioner (Geriatric Medicine)  Extended Emergency Contact Information Primary Emergency Contact: [facility] Address: [address]          [place], [place] [address] [name] of [place] Home Phone: [phone] Work Phone: [phone] Mobile Phone: [phone] Relation: Daughter Secondary Emergency Contact: Inman,[name] Address: [address]          [place], [place] [address] [name] of [place] Home Phone: [phone] Work Phone: [phone] Mobile Phone: [phone] Relation: Son  Code Status:  DNR Goals of care: Advanced Directive information    07/10/2022   11:57 AM  Advanced Directives  Does Patient Have a Medical Advance Directive? Yes  Type of Advance Directive Out of facility DNR (pink MOST or yellow form)  Does patient want to make changes to medical advance directive? No - Patient declined  Pre-existing out of facility DNR order (yellow form or pink MOST form) Yellow form placed in chart (order not valid for inpatient use)     Chief Complaint  Patient presents with   Acute Visit    Positive COVID test    HPI:  Pt is a 81 y.o. female seen today for an acute visit for positive COVID-19 test.  She was seen in the room today. She complained of having yellowish nasal discharge and cough. No fever nor chills. She takes Sinemet for Parkinson's disease.   Past Medical History:  Diagnosis Date   Dementia (HCC)    Essential hypertension, benign    GERD (gastroesophageal reflux disease)    Glaucoma    Parkinson disease    Past Surgical History:  Procedure Laterality Date   INTRAMEDULLARY (IM) NAIL INTERTROCHANTERIC Left 11/03/2020   Procedure: INTRAMEDULLARY (IM) NAIL  INTERTROCHANTRIC;  Surgeon: Christena Flake, MD;  Location: [facility];  Service: Orthopedics;  Laterality: Left;   LAPAROSCOPIC APPENDECTOMY N/A 03/07/2020   Procedure: APPENDECTOMY LAPAROSCOPIC;  Surgeon: Henrene Dodge, MD;  Location: [facility];  Service: General;  Laterality: N/A;   VAGINAL DELIVERY     twice    Allergies  Allergen Reactions   Nsaids    Demerol [Meperidine Hcl]    Diazepam Other (See Comments)    Unknown  Other reaction(s): Unknown Unknown Other Reaction: OTHER REACTION   Ibuprofen Swelling   Meperidine Other (See Comments)    Other Reaction: OTHER REACTION   Mushroom Extract Complex Nausea And Vomiting   Shellfish Allergy Rash    Allergies as of 07/10/2022       Reactions   Nsaids    Demerol [meperidine Hcl]    Diazepam Other (See Comments)   Unknown Other reaction(s): Unknown Unknown Other Reaction: OTHER REACTION   Ibuprofen Swelling   Meperidine Other (See Comments)   Other Reaction: OTHER REACTION   Mushroom Extract Complex Nausea And Vomiting   Shellfish Allergy Rash        Medication List        Accurate as of July 10, 2022 12:05 PM. If you have any questions, ask your nurse or doctor.          STOP taking these medications    LORazepam 0.5 MG tablet Commonly known as: ATIVAN       TAKE these medications    acetaminophen 650 MG CR tablet Commonly  known as: TYLENOL Take 650 mg by mouth every 4 (four) hours as needed for pain.   Artificial Tears 1.4 % ophthalmic solution Generic drug: polyvinyl alcohol Place 1 drop into both eyes as needed for dry eyes.   bismuth subsalicylate 262 MG/15ML suspension Commonly known as: PEPTO BISMOL Take 30 mLs by mouth every 6 (six) hours as needed for indigestion or diarrhea or loose stools.   carbidopa-levodopa 25-100 MG tablet Commonly known as: SINEMET IR Take 1 tablet by mouth 3 (three) times daily. And takes 2 tablets once daily   Dermagran ointment Generic drug: aluminum  hydroxide Apply 1 application  topically as directed. To coccyx/buttocks for skin protection every shift   fluPHENAZine decanoate 25 MG/ML injection Commonly known as: PROLIXIN Inject 2 mLs into the muscle daily. Every 21 days   guaifenesin 100 MG/5ML syrup Commonly known as: ROBITUSSIN Take 200 mg by mouth every 4 (four) hours as needed for cough.   ketoconazole 2 % shampoo Commonly known as: NIZORAL Apply 1 Application topically 2 (two) times a week. For dandruff every Tuesday and Friday   latanoprost 0.005 % ophthalmic solution Commonly known as: XALATAN Place 1 drop into both eyes at bedtime.   nitroGLYCERIN 0.4 MG SL tablet Commonly known as: NITROSTAT Place 0.4 mg under the tongue every 5 (five) minutes as needed for chest pain.   OcuSoft Eyelid Cleansing Pads Apply 1 Application topically daily. For matted eyes   Saline 0.9 % Soln Place 1 spray into the nose every hour as needed. Both nostrils   SF 5000 Plus 1.1 % Crea dental cream Generic drug: sodium fluoride Place 1 application onto teeth in the morning, at noon, and at bedtime. Brush thoroughly for 2 minutes, do not rinse after   timolol 0.5 % ophthalmic solution Commonly known as: TIMOPTIC Place 1 drop into both eyes daily.   Vitamin D (Ergocalciferol) 1.25 MG (50000 UNIT) Caps capsule Commonly known as: DRISDOL Take 1 capsule (50,000 Units total) by mouth every 30 (thirty) days.        Review of Systems  Unable to obtain due to dementia.  Immunization History  Administered Date(s) Administered   Influenza-Unspecified 05/01/2022   Moderna Sars-Covid-2 Vaccination 08/24/2019, 09/21/2019   Tdap 09/14/2007, 11/05/2021   Unspecified SARS-COV-2 Vaccination 05/27/2020, 12/02/2020, 04/07/2021, 12/12/2021   Pertinent  Health Maintenance Due  Topic Date Due   DEXA SCAN  Never done   INFLUENZA VACCINE  Completed      11/05/2020    4:00 AM 11/05/2020    7:40 AM 11/06/2020    4:00 AM 11/06/2020    7:52 AM  11/05/2021    5:05 AM  Fall Risk  Patient Fall Risk Level High fall risk High fall risk High fall risk High fall risk High fall risk   Functional Status Survey:    Vitals:   07/10/22 1155  BP: (!) 157/91  Pulse: 75  Resp: 16  Temp: (!) 97.3 F (36.3 C)  SpO2: 95%  Weight: 105 lb 3.2 oz (47.7 kg)  Height: 5\' 4"  (1.626 m)   Body mass index is 18.06 kg/m. Physical Exam Constitutional:      General: She is not in acute distress. HENT:     Head: Normocephalic and atraumatic.     Nose: Nose normal.     Mouth/Throat:     Mouth: Mucous membranes are moist.  Eyes:     Conjunctiva/sclera: Conjunctivae normal.  Cardiovascular:     Rate and Rhythm: Normal rate and regular rhythm.  Pulmonary:     Effort: Pulmonary effort is normal.     Breath sounds: Normal breath sounds.  Abdominal:     General: Bowel sounds are normal.     Palpations: Abdomen is soft.  Musculoskeletal:     Cervical back: Normal range of motion.  Skin:    General: Skin is warm and dry.  Neurological:     General: No focal deficit present.  Psychiatric:        Mood and Affect: Mood normal.        Behavior: Behavior normal.     Labs reviewed: Recent Labs    11/05/21 0514  NA 137  K 3.9  CL 106  CO2 27  GLUCOSE 90  BUN 19  CREATININE 0.58  CALCIUM 8.7*   Recent Labs    11/05/21 0514  AST 18  ALT 11  ALKPHOS 76  BILITOT 1.0  PROT 6.7  ALBUMIN 3.8   Recent Labs    11/05/21 0514  WBC 5.3  NEUTROABS 3.1  HGB 14.1  HCT 41.6  MCV 92.0  PLT 188   Lab Results  Component Value Date   TSH 1.103 03/02/2016   No results found for: "HGBA1C" No results found for: "CHOL", "HDL", "LDLCALC", "LDLDIRECT", "TRIG", "CHOLHDL"  Significant Diagnostic Results in last 30 days:  No results found.  Assessment/Plan  1. COVID-19 -   +COVID-19 test -  continue respiratory isolation X 10 days -  will start on Molnupiravir 800 mg BID X 5 days -  Guaifenesin 100 mg/69ml give 10 ml PO Q 8 hours X 2  weeks  2. Parkinson's disease without fluctuating manifestations, unspecified whether dyskinesia present -  stable, continue Sinemet   Family/ staff Communication: Discussed plan of care with charge nurse.  Labs/tests ordered:   CBC and BMP

## 2022-07-11 ENCOUNTER — Non-Acute Institutional Stay (SKILLED_NURSING_FACILITY): Payer: Medicare Other | Admitting: Student

## 2022-07-11 ENCOUNTER — Encounter: Payer: Self-pay | Admitting: Student

## 2022-07-11 DIAGNOSIS — F22 Delusional disorders: Secondary | ICD-10-CM

## 2022-07-11 DIAGNOSIS — K219 Gastro-esophageal reflux disease without esophagitis: Secondary | ICD-10-CM | POA: Diagnosis not present

## 2022-07-11 DIAGNOSIS — E441 Mild protein-calorie malnutrition: Secondary | ICD-10-CM

## 2022-07-11 DIAGNOSIS — I10 Essential (primary) hypertension: Secondary | ICD-10-CM | POA: Diagnosis not present

## 2022-07-11 DIAGNOSIS — S72142S Displaced intertrochanteric fracture of left femur, sequela: Secondary | ICD-10-CM

## 2022-07-11 DIAGNOSIS — F03C0 Unspecified dementia, severe, without behavioral disturbance, psychotic disturbance, mood disturbance, and anxiety: Secondary | ICD-10-CM | POA: Diagnosis not present

## 2022-07-11 DIAGNOSIS — G20A1 Parkinson's disease without dyskinesia, without mention of fluctuations: Secondary | ICD-10-CM

## 2022-07-11 DIAGNOSIS — U071 COVID-19: Secondary | ICD-10-CM | POA: Diagnosis not present

## 2022-07-11 NOTE — Progress Notes (Signed)
Location:  Other [facility].  Nursing Home Room Number: [facility] 204A Place of Service:  SNF [phone]) Provider:  Dr. Sherri Rad, MD  Patient Care Team: Earnestine Mealing, MD as PCP - General (Family Medicine) Sharon Seller, NP as Nurse Practitioner (Geriatric Medicine)  Extended Emergency Contact Information Primary Emergency Contact: [facility] Address: [address]          [place], [place] [address] [name] of [place] Home Phone: [phone] Work Phone: [phone] Mobile Phone: [phone] Relation: Daughter Secondary Emergency Contact: Lafontant,[name] Address: [address]          [place], [place] [address] [name] of [place] Home Phone: [phone] Work Phone: [phone] Mobile Phone: [phone] Relation: Son  Code Status:  DNR Goals of care: Advanced Directive information    07/11/2022   10:16 AM  Advanced Directives  Does Patient Have a Medical Advance Directive? Yes  Type of Advance Directive Out of facility DNR (pink MOST or yellow form)  Does patient want to make changes to medical advance directive? No - Patient declined     Chief Complaint  Patient presents with   Medical Management of Chronic Issues    Medical Management of Chronic Issues.     HPI:  Pt is a 81 y.o. female seen today for medical management of chronic diseases.  Patient was diagnosed with COVID. She has had minimal symptoms. She was started on mulnapavir.   Patient is slouched in chair, but is able to give her name and the names of her children. Denies   Patient has tried to get up from her chair on multiple occasions per nursing. She is eating well and taking her medications.    Past Medical History:  Diagnosis Date   Dementia (HCC)    Essential hypertension, benign    GERD (gastroesophageal reflux disease)    Glaucoma    Parkinson disease    Past Surgical History:  Procedure Laterality Date   INTRAMEDULLARY (IM) NAIL INTERTROCHANTERIC Left  11/03/2020   Procedure: INTRAMEDULLARY (IM) NAIL INTERTROCHANTRIC;  Surgeon: Christena Flake, MD;  Location: [facility] ORS;  Service: Orthopedics;  Laterality: Left;   LAPAROSCOPIC APPENDECTOMY N/A 03/07/2020   Procedure: APPENDECTOMY LAPAROSCOPIC;  Surgeon: Henrene Dodge, MD;  Location: [facility] ORS;  Service: General;  Laterality: N/A;   VAGINAL DELIVERY     twice    Allergies  Allergen Reactions   Nsaids    Demerol [Meperidine Hcl]    Diazepam Other (See Comments)    Unknown  Other reaction(s): Unknown Unknown Other Reaction: OTHER REACTION   Ibuprofen Swelling   Meperidine Other (See Comments)    Other Reaction: OTHER REACTION   Mushroom Extract Complex Nausea And Vomiting   Other Diarrhea    Sour Cream and Yogurt.    Shellfish Allergy Rash    Outpatient Encounter Medications as of 07/11/2022  Medication Sig   acetaminophen (TYLENOL) 650 MG CR tablet Take 650 mg by mouth every 4 (four) hours as needed for pain.   aluminum hydroxide (DERMAGRAN) ointment Apply 1 application  topically as directed. To coccyx/buttocks for skin protection every shift   bismuth subsalicylate (PEPTO BISMOL) 262 MG/15ML suspension Take 30 mLs by mouth every 6 (six) hours as needed for indigestion or diarrhea or loose stools.   carbidopa-levodopa (SINEMET IR) 25-100 MG tablet Take 1 tablet by mouth 3 (three) times daily. And takes 2 tablets once daily   Eyelid Cleansers (OCUSOFT EYELID CLEANSING) PADS Apply 1 Application topically daily. For matted eyes  fluPHENAZine decanoate (PROLIXIN) 25 MG/ML injection Inject 2 mLs into the muscle daily. Every 21 days   guaifenesin (ROBITUSSIN) 100 MG/5ML syrup Take 200 mg by mouth every 4 (four) hours as needed for cough.   ketoconazole (NIZORAL) 2 % shampoo Apply 1 Application topically 2 (two) times a week. For dandruff every Tuesday and Friday   latanoprost (XALATAN) 0.005 % ophthalmic solution Place 1 drop into both eyes at bedtime.   molnupiravir EUA (LAGEVRIO) 200 MG  CAPS capsule Take 4 capsules by mouth 2 (two) times daily.   nitroGLYCERIN (NITROSTAT) 0.4 MG SL tablet Place 0.4 mg under the tongue every 5 (five) minutes as needed for chest pain.   polyvinyl alcohol (ARTIFICIAL TEARS) 1.4 % ophthalmic solution Place 1 drop into both eyes as needed for dry eyes.   Saline 0.9 % SOLN Place 1 spray into the nose every hour as needed. Both nostrils   SF 5000 PLUS 1.1 % CREA dental cream Place 1 application onto teeth in the morning, at noon, and at bedtime. Brush thoroughly for 2 minutes, do not rinse after   timolol (TIMOPTIC) 0.5 % ophthalmic solution Place 1 drop into both eyes daily.   Vitamin D, Ergocalciferol, (DRISDOL) 50000 units CAPS capsule Take 1 capsule (50,000 Units total) by mouth every 30 (thirty) days.   No facility-administered encounter medications on file as of 07/11/2022.    Review of Systems  Unable to perform ROS: Dementia    Immunization History  Administered Date(s) Administered   Influenza-Unspecified 05/01/2022   Moderna Sars-Covid-2 Vaccination 08/24/2019, 09/21/2019, 05/25/2022   Tdap 09/14/2007, 11/05/2021   Unspecified SARS-COV-2 Vaccination 05/27/2020, 12/02/2020, 04/07/2021, 12/12/2021   Pertinent  Health Maintenance Due  Topic Date Due   DEXA SCAN  Never done   INFLUENZA VACCINE  Completed      11/05/2020    4:00 AM 11/05/2020    7:40 AM 11/06/2020    4:00 AM 11/06/2020    7:52 AM 11/05/2021    5:05 AM  Fall Risk  Patient Fall Risk Level High fall risk High fall risk High fall risk High fall risk High fall risk   Functional Status Survey:    Vitals:   07/11/22 1006  BP: (!) 159/90  Pulse: 70  Resp: 16  Temp: (!) 97.4 F (36.3 C)  SpO2: 94%  Weight: 105 lb 3.2 oz (47.7 kg)  Height: 5\' 4"  (1.626 m)   Body mass index is 18.06 kg/m. Physical Exam Constitutional:      Comments: Slouched in whelchair  Cardiovascular:     Rate and Rhythm: Normal rate.  Pulmonary:     Effort: Pulmonary effort is normal.   Abdominal:     General: Bowel sounds are normal.     Palpations: Abdomen is soft.  Skin:    General: Skin is warm and dry.  Neurological:     Mental Status: She is alert. Mental status is at baseline.     Labs reviewed: Recent Labs    11/05/21 0514  NA 137  K 3.9  CL 106  CO2 27  GLUCOSE 90  BUN 19  CREATININE 0.58  CALCIUM 8.7*   Recent Labs    11/05/21 0514  AST 18  ALT 11  ALKPHOS 76  BILITOT 1.0  PROT 6.7  ALBUMIN 3.8   Recent Labs    11/05/21 0514  WBC 5.3  NEUTROABS 3.1  HGB 14.1  HCT 41.6  MCV 92.0  PLT 188   Lab Results  Component Value Date  TSH 1.103 03/02/2016   No results found for: "HGBA1C" No results found for: "CHOL", "HDL", "LDLCALC", "LDLDIRECT", "TRIG", "CHOLHDL"  Significant Diagnostic Results in last 30 days:  No results found.  Assessment/Plan 1. COVID-19 virus infection Patient currently receiving mulnapavir for treatment of acute COVID. Afebrile and on room air without signs of distress. Continue to monitor for progression of symptoms   2. Primary hypertension BP Elevated at this time, however, in the absence of acute illness, BP is well controlled. Continue to monitor. Treat if >180/110  3. GERD without esophagitis No symptoms or meds at this time. CTM.   4. Severe dementia without behavioral disturbance, psychotic disturbance, mood disturbance, or anxiety, unspecified dementia type (HCC) 5. Delusional disorder (HCC) 6. Parkinson's disease without fluctuating manifestations, unspecified whether dyskinesia present Patient with history of PD and dementia. Continued difficulty following directions. Significant contractures on exam. Continue Sinemet 25-100 TID. Continue Prolixin 25 mg injections daily for delusions.   7. Closed displaced intertrochanteric fracture of left femur, sequela Occurred 11/04/20. No symptoms at this time. CTM  8. Malnutrition of mild degree (HCC) Weight has been stable for 1 year, however, BMI 18.  Will need to continue supplementation as tolerated.     Family/ staff Communication: Spoke with patient's granddaughter, they were upated regarding patient's current status - she is stable.   Labs/tests ordered:  none

## 2022-07-12 LAB — BASIC METABOLIC PANEL
BUN: 18 (ref 4–21)
CO2: 30 — AB (ref 13–22)
Chloride: 102 (ref 99–108)
Creatinine: 0.6 (ref 0.5–1.1)
Glucose: 74
Potassium: 3.8 mEq/L (ref 3.5–5.1)
Sodium: 139 (ref 137–147)

## 2022-07-12 LAB — COMPREHENSIVE METABOLIC PANEL
Calcium: 8.7 (ref 8.7–10.7)
eGFR: 88

## 2022-07-12 LAB — CBC AND DIFFERENTIAL
HCT: 42 (ref 36–46)
Hemoglobin: 14.2 (ref 12.0–16.0)
Neutrophils Absolute: 3175
Platelets: 214 10*3/uL (ref 150–400)
WBC: 5

## 2022-07-12 LAB — CBC: RBC: 4.56 (ref 3.87–5.11)

## 2022-08-14 ENCOUNTER — Non-Acute Institutional Stay (SKILLED_NURSING_FACILITY): Payer: Medicare Other | Admitting: Nurse Practitioner

## 2022-08-14 ENCOUNTER — Encounter: Payer: Self-pay | Admitting: Nurse Practitioner

## 2022-08-14 DIAGNOSIS — E559 Vitamin D deficiency, unspecified: Secondary | ICD-10-CM

## 2022-08-14 DIAGNOSIS — G20A1 Parkinson's disease without dyskinesia, without mention of fluctuations: Secondary | ICD-10-CM

## 2022-08-14 DIAGNOSIS — K219 Gastro-esophageal reflux disease without esophagitis: Secondary | ICD-10-CM | POA: Diagnosis not present

## 2022-08-14 DIAGNOSIS — F03C0 Unspecified dementia, severe, without behavioral disturbance, psychotic disturbance, mood disturbance, and anxiety: Secondary | ICD-10-CM | POA: Diagnosis not present

## 2022-08-14 DIAGNOSIS — I10 Essential (primary) hypertension: Secondary | ICD-10-CM | POA: Diagnosis not present

## 2022-08-14 DIAGNOSIS — L308 Other specified dermatitis: Secondary | ICD-10-CM

## 2022-08-14 NOTE — Progress Notes (Unsigned)
Location:   [facility] Room Number: [phone] Place of Service:  SNF [phone]) Provider:  Sherrie Mustache, NP  Dewayne Shorter, MD  Patient Care Team: Dewayne Shorter, MD as PCP - General (Family Medicine) Lauree Chandler, NP as Nurse Practitioner (Geriatric Medicine)  Extended Emergency Contact Information Primary Emergency Contact: [facility] Address: [address]          [place], [place] [address] Johnnette Litter of [place] Phone: [phone] Work Phone: [phone] Mobile Phone: [phone] Relation: Daughter Secondary Emergency Contact: Heater,[name] Address: [address]          [place], [place] [address] Johnnette Litter of [place] Phone: [phone] Work Phone: [phone] Mobile Phone: [phone] Relation: Son  Code Status:  DNR Goals of care: Advanced Directive information    08/14/2022    2:19 PM  Advanced Directives  Does Patient Have a Medical Advance Directive? Yes  Type of Advance Directive Out of facility DNR (pink MOST or yellow form)  Does patient want to make changes to medical advance directive? No - Patient declined  Pre-existing out of facility DNR order (yellow form or pink MOST form) Yellow form placed in chart (order not valid for inpatient use)     Chief Complaint  Patient presents with   Medical Management of Chronic Issues    Routine follow up. Patient complains of rash   Immunizations    Shingrix vaccine, pneumonia vaccine, and current COVID booster    Quality Metric Gaps    Dexa scan and medicare annual wellness due.    HPI:  Pt is a 82 y.o. female seen today for medical management of chronic diseases.  Pt with hx of dementia, htn, GERD and parkinson disease.  Pt has had recurrent pustular dermatitis. Nursing notes small area returning at this time She has completed cleocin back in November.  Weight has been stable.  Requires total care from staff  She had COVID in December but doing well since.      Past Medical History:  Diagnosis Date   Dementia ([place])    Essential hypertension, benign    GERD (gastroesophageal reflux disease)    Glaucoma    Parkinson disease    Past Surgical History:  Procedure Laterality Date   INTRAMEDULLARY (IM) NAIL INTERTROCHANTERIC Left 11/03/2020   Procedure: INTRAMEDULLARY (IM) NAIL INTERTROCHANTRIC;  Surgeon: Corky Mull, MD;  Location: [facility] ORS;  Service: Orthopedics;  Laterality: Left;   LAPAROSCOPIC APPENDECTOMY N/A 03/07/2020   Procedure: APPENDECTOMY LAPAROSCOPIC;  Surgeon: Olean Ree, MD;  Location: [facility] ORS;  Service: General;  Laterality: N/A;   VAGINAL DELIVERY     twice    Allergies  Allergen Reactions   Nsaids    Demerol [Meperidine Hcl]    Diazepam Other (See Comments)    Unknown  Other reaction(s): Unknown Unknown Other Reaction: OTHER REACTION   Ibuprofen Swelling   Meperidine Other (See Comments)    Other Reaction: OTHER REACTION   Mushroom Extract Complex Nausea And Vomiting   Other Diarrhea    Sour Cream and Yogurt.    Shellfish Allergy Rash    Allergies as of 08/14/2022       Reactions   Nsaids    Demerol [meperidine Hcl]    Diazepam Other (See Comments)   Unknown Other reaction(s): Unknown Unknown Other Reaction: OTHER REACTION   Ibuprofen Swelling   Meperidine Other (See Comments)   Other Reaction: OTHER REACTION   Mushroom Extract Complex Nausea And Vomiting   Other Diarrhea  Sour Cream and Yogurt.    Shellfish Allergy Rash        Medication List        Accurate as of August 14, 2022 11:59 PM. If you have any questions, ask your nurse or doctor.          acetaminophen 650 MG CR tablet Commonly known as: TYLENOL Take 650 mg by mouth every 4 (four) hours as needed for pain.   Artificial Tears 1.4 % ophthalmic solution Generic drug: polyvinyl alcohol Place 1 drop into both eyes as needed for dry eyes.   bismuth subsalicylate 237 SE/83TD suspension Commonly known as: PEPTO  BISMOL Take 30 mLs by mouth every 6 (six) hours as needed for indigestion or diarrhea or loose stools.   carbidopa-levodopa 25-100 MG tablet Commonly known as: SINEMET IR Take 1 tablet by mouth 3 (three) times daily. And takes 2 tablets once daily   Dermagran ointment Generic drug: aluminum hydroxide Apply 1 application  topically as directed. To coccyx/buttocks for skin protection every shift   fluPHENAZine decanoate 25 MG/ML injection Commonly known as: PROLIXIN Inject 2 mLs into the muscle daily. Every 21 days   guaifenesin 100 MG/5ML syrup Commonly known as: ROBITUSSIN Take 200 mg by mouth every 4 (four) hours as needed for cough.   ketoconazole 2 % shampoo Commonly known as: NIZORAL Apply 1 Application topically 2 (two) times a week. For dandruff every Tuesday and Friday   latanoprost 0.005 % ophthalmic solution Commonly known as: XALATAN Place 1 drop into both eyes at bedtime.   molnupiravir EUA 200 MG Caps capsule Commonly known as: LAGEVRIO Take 4 capsules by mouth 2 (two) times daily.   nitroGLYCERIN 0.4 MG SL tablet Commonly known as: NITROSTAT Place 0.4 mg under the tongue every 5 (five) minutes as needed for chest pain.   OcuSoft Eyelid Cleansing Pads Apply 1 Application topically daily. For matted eyes   Saline 0.9 % Soln Place 1 spray into the nose every hour as needed. Both nostrils   SF 5000 Plus 1.1 % Crea dental cream Generic drug: sodium fluoride Place 1 application onto teeth in the morning, at noon, and at bedtime. Brush thoroughly for 2 minutes, do not rinse after   timolol 0.5 % ophthalmic solution Commonly known as: TIMOPTIC Place 1 drop into both eyes daily.   Vitamin D (Ergocalciferol) 1.25 MG (50000 UNIT) Caps capsule Commonly known as: DRISDOL Take 1 capsule (50,000 Units total) by mouth every 30 (thirty) days.        Review of Systems  Unable to perform ROS: Dementia    Immunization History  Administered Date(s) Administered    Influenza-Unspecified 05/01/2022   Moderna Sars-Covid-2 Vaccination 08/24/2019, 09/21/2019, 05/25/2022   Tdap 09/14/2007, 11/05/2021   Unspecified SARS-COV-2 Vaccination 05/27/2020, 12/02/2020, 04/07/2021, 12/12/2021   Pertinent  Health Maintenance Due  Topic Date Due   INFLUENZA VACCINE  Completed   DEXA SCAN  Discontinued      11/05/2020    4:00 AM 11/05/2020    7:40 AM 11/06/2020    4:00 AM 11/06/2020    7:52 AM 11/05/2021    5:05 AM  Fall Risk  (RETIRED) Patient Fall Risk Level High fall risk High fall risk High fall risk High fall risk High fall risk   Functional Status Survey:    Vitals:   08/14/22 0959 08/14/22 1416  BP: 130/72 (!) 160/90  Pulse:  76  Resp:  18  Temp:  (!) 97.3 F (36.3 C)  SpO2:  95%  Weight:  105 lb 3.2 oz (47.7 kg)  Height:  5\' 4"  (1.626 m)   Body mass index is 18.06 kg/m. Physical Exam Constitutional:      General: She is not in acute distress.    Appearance: She is not diaphoretic.     Comments: Minimal verbal sitting in wheelchair   HENT:     Head: Normocephalic and atraumatic.     Right Ear: External ear normal.     Left Ear: External ear normal.     Nose: Nose normal.     Mouth/Throat:     Mouth: Mucous membranes are moist.     Pharynx: No oropharyngeal exudate.  Eyes:     Conjunctiva/sclera: Conjunctivae normal.     Pupils: Pupils are equal, round, and reactive to light.  Cardiovascular:     Rate and Rhythm: Normal rate and regular rhythm.     Heart sounds: Normal heart sounds.  Pulmonary:     Effort: Pulmonary effort is normal.     Breath sounds: Normal breath sounds.  Abdominal:     General: Bowel sounds are normal.     Palpations: Abdomen is soft.  Musculoskeletal:        General: No tenderness.     Cervical back: Normal range of motion and neck supple.  Skin:    General: Skin is warm and dry.     Findings: Rash (small linear clusters of pustules to forehead) present.  Neurological:     Mental Status: She is easily  aroused. Mental status is at baseline.     Gait: Gait abnormal (wheelchair bound).     Labs reviewed: Recent Labs    11/05/21 0514 07/12/22 0000  NA 137 139  K 3.9 3.8  CL 106 102  CO2 27 30*  GLUCOSE 90  --   BUN 19 18  CREATININE 0.58 0.6  CALCIUM 8.7* 8.7   Recent Labs    11/05/21 0514  AST 18  ALT 11  ALKPHOS 76  BILITOT 1.0  PROT 6.7  ALBUMIN 3.8   Recent Labs    11/05/21 0514 07/12/22 0000  WBC 5.3 5.0  NEUTROABS 3.1 3,175.00  HGB 14.1 14.2  HCT 41.6 42  MCV 92.0  --   PLT 188 214   Lab Results  Component Value Date   TSH 1.103 03/02/2016   No results found for: "HGBA1C" No results found for: "CHOL", "HDL", "LDLCALC", "LDLDIRECT", "TRIG", "CHOLHDL"  Significant Diagnostic Results in last 30 days:  No results found.  Assessment/Plan 1. Primary hypertension -Blood pressure well controlled, goal bp <140/90 Continue current medications and dietary modifications follow metabolic panel  2. GERD without esophagitis -controlled at this time.   3. Severe dementia without behavioral disturbance, psychotic disturbance, mood disturbance, or anxiety, unspecified dementia type (HCC) -Stable, no acute changes in cognitive or functional status, continue supportive care.   4. Parkinson's disease without fluctuating manifestations, unspecified whether dyskinesia present -stable, continues on carbidopa-levodopa TID  5. Pustular dermatitis -ongoing, mild at this time, will have staff use cetaphil facewash twice daily  6. Vitamin D deficiency -continues on supplement.     Family/ staff Communication:   Labs/tests ordered:

## 2022-10-05 ENCOUNTER — Non-Acute Institutional Stay (SKILLED_NURSING_FACILITY): Payer: Medicare Other | Admitting: Student

## 2022-10-05 ENCOUNTER — Encounter: Payer: Self-pay | Admitting: Student

## 2022-10-05 DIAGNOSIS — G20A1 Parkinson's disease without dyskinesia, without mention of fluctuations: Secondary | ICD-10-CM | POA: Diagnosis not present

## 2022-10-05 DIAGNOSIS — F22 Delusional disorders: Secondary | ICD-10-CM | POA: Diagnosis not present

## 2022-10-05 DIAGNOSIS — E441 Mild protein-calorie malnutrition: Secondary | ICD-10-CM

## 2022-10-05 DIAGNOSIS — I10 Essential (primary) hypertension: Secondary | ICD-10-CM

## 2022-10-05 DIAGNOSIS — L308 Other specified dermatitis: Secondary | ICD-10-CM

## 2022-10-05 DIAGNOSIS — F02C3 Dementia in other diseases classified elsewhere, severe, with mood disturbance: Secondary | ICD-10-CM

## 2022-10-05 NOTE — Progress Notes (Unsigned)
Location:  Other [place].  Nursing Home Room Number: [facility] 204A Place of Service:  SNF [phone]) Provider:  Dewayne Shorter, MD  Patient Care Team: Dewayne Shorter, MD as PCP - General (Family Medicine) Lauree Chandler, NP as Nurse Practitioner (Geriatric Medicine)  Extended Emergency Contact Information Primary Emergency Contact: [facility] Address: [address]          [place], [place] [address] Johnnette Litter of [place] Phone: [phone] Work Phone: [phone] Mobile Phone: [phone] Relation: Daughter Secondary Emergency Contact: Everman,[name] Address: [address]          [place], [place] [address] Johnnette Litter of [place] Phone: [phone] Work Phone: [phone] Mobile Phone: [phone] Relation: Son  Code Status:  DNR Goals of care: Advanced Directive information    10/05/2022   10:07 AM  Advanced Directives  Does Patient Have a Medical Advance Directive? Yes  Type of Advance Directive Out of facility DNR (pink MOST or yellow form)  Does patient want to make changes to medical advance directive? No - Patient declined     Chief Complaint  Patient presents with   Medical Management of Chronic Issues    Medical Management of Chronic Issues.     HPI:  Pt is a 82 y.o. female seen today for medical management of chronic diseases.    No concerns from staff. Patient requires total care.   She is able to say hello and answer short yes, or no questions, however, primarily nonsensical speech.   Past Medical History:  Diagnosis Date   Dementia ([place])    Essential hypertension, benign    GERD (gastroesophageal reflux disease)    Glaucoma    Parkinson disease    Past Surgical History:  Procedure Laterality Date   INTRAMEDULLARY (IM) NAIL INTERTROCHANTERIC Left 11/03/2020   Procedure: INTRAMEDULLARY (IM) NAIL INTERTROCHANTRIC;  Surgeon: Corky Mull, MD;  Location: [facility] ORS;  Service: Orthopedics;  Laterality: Left;   LAPAROSCOPIC  APPENDECTOMY N/A 03/07/2020   Procedure: APPENDECTOMY LAPAROSCOPIC;  Surgeon: Olean Ree, MD;  Location: [facility] ORS;  Service: General;  Laterality: N/A;   VAGINAL DELIVERY     twice    Allergies  Allergen Reactions   Nsaids    Demerol [Meperidine Hcl]    Diazepam Other (See Comments)    Unknown  Other reaction(s): Unknown Unknown Other Reaction: OTHER REACTION   Ibuprofen Swelling   Meperidine Other (See Comments)    Other Reaction: OTHER REACTION   Mushroom Extract Complex Nausea And Vomiting   Other Diarrhea    Sour Cream and Yogurt.    Shellfish Allergy Rash    Outpatient Encounter Medications as of 10/05/2022  Medication Sig   acetaminophen (TYLENOL) 650 MG CR tablet Take 650 mg by mouth every 4 (four) hours as needed for pain.   bismuth subsalicylate (PEPTO BISMOL) 262 MG/15ML suspension Take 30 mLs by mouth every 6 (six) hours as needed for indigestion or diarrhea or loose stools.   carbidopa-levodopa (SINEMET IR) 25-100 MG tablet Take 1 tablet by mouth 3 (three) times daily. And takes 2 tablets once daily   Emollient (CETAPHIL) cream Apply to back and shoulders topically every evening and night.   Eyelid Cleansers (OCUSOFT EYELID CLEANSING) PADS Apply 1 Application topically daily. For matted eyes   fluPHENAZine decanoate (PROLIXIN) 25 MG/ML injection Inject 2 mLs into the muscle daily. Every 21 days   guaifenesin (ROBITUSSIN) 100 MG/5ML syrup Take 200 mg by mouth every 4 (four) hours as needed for cough.   ketoconazole (  NIZORAL) 2 % shampoo Apply 1 Application topically 2 (two) times a week. For dandruff every Tuesday and Friday   latanoprost (XALATAN) 0.005 % ophthalmic solution Place 1 drop into both eyes at bedtime.   nitroGLYCERIN (NITROSTAT) 0.4 MG SL tablet Place 0.4 mg under the tongue every 5 (five) minutes as needed for chest pain.   polyvinyl alcohol (ARTIFICIAL TEARS) 1.4 % ophthalmic solution Place 1 drop into both eyes as needed for dry eyes.   Saline 0.9 %  SOLN Place 1 spray into the nose every hour as needed. Both nostrils   SF 5000 PLUS 1.1 % CREA dental cream Place 1 application onto teeth in the morning, at noon, and at bedtime. Brush thoroughly for 2 minutes, do not rinse after   timolol (TIMOPTIC) 0.5 % ophthalmic solution Place 1 drop into both eyes daily.   Vitamin D, Ergocalciferol, (DRISDOL) 50000 units CAPS capsule Take 1 capsule (50,000 Units total) by mouth every 30 (thirty) days.   aluminum hydroxide (DERMAGRAN) ointment Apply 1 application  topically as directed. To coccyx/buttocks for skin protection every shift   [DISCONTINUED] molnupiravir EUA (LAGEVRIO) 200 MG CAPS capsule Take 4 capsules by mouth 2 (two) times daily.   No facility-administered encounter medications on file as of 10/05/2022.    Review of Systems  Immunization History  Administered Date(s) Administered   Influenza-Unspecified 05/01/2022   Moderna Sars-Covid-2 Vaccination 08/24/2019, 09/21/2019, 05/25/2022   Tdap 09/14/2007, 11/05/2021   Unspecified SARS-COV-2 Vaccination 05/27/2020, 12/02/2020, 04/07/2021, 12/12/2021   Pertinent  Health Maintenance Due  Topic Date Due   INFLUENZA VACCINE  Completed   DEXA SCAN  Discontinued      11/05/2020    4:00 AM 11/05/2020    7:40 AM 11/06/2020    4:00 AM 11/06/2020    7:52 AM 11/05/2021    5:05 AM  Fall Risk  (RETIRED) Patient Fall Risk Level High fall risk High fall risk High fall risk High fall risk High fall risk   Functional Status Survey:    Vitals:   10/05/22 1000 10/05/22 1008  BP: (!) 158/88 112/78  Pulse: 76   Resp: 18   Temp: (!) 97.5 F (36.4 C)   SpO2: 97%   Weight: 110 lb 3.2 oz (50 kg)   Height: 5\' 4"  (1.626 m)    Body mass index is 18.92 kg/m. Physical Exam Constitutional:      Comments: Curled up in gerichair.   Cardiovascular:     Rate and Rhythm: Normal rate.     Pulses: Normal pulses.  Pulmonary:     Effort: Pulmonary effort is normal.  Abdominal:     General: Bowel sounds  are normal.     Palpations: Abdomen is soft.  Neurological:     Mental Status: Mental status is at baseline. She is disoriented.     Labs reviewed: Recent Labs    11/05/21 0514 07/12/22 0000  NA 137 139  K 3.9 3.8  CL 106 102  CO2 27 30*  GLUCOSE 90  --   BUN 19 18  CREATININE 0.58 0.6  CALCIUM 8.7* 8.7   Recent Labs    11/05/21 0514  AST 18  ALT 11  ALKPHOS 76  BILITOT 1.0  PROT 6.7  ALBUMIN 3.8   Recent Labs    11/05/21 0514 07/12/22 0000  WBC 5.3 5.0  NEUTROABS 3.1 3,175.00  HGB 14.1 14.2  HCT 41.6 42  MCV 92.0  --   PLT 188 214   Lab Results  Component  Value Date   TSH 1.103 03/02/2016   No results found for: "HGBA1C" No results found for: "CHOL", "HDL", "LDLCALC", "LDLDIRECT", "TRIG", "CHOLHDL"  Significant Diagnostic Results in last 30 days:  No results found.  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 BP well-controlled at this time. Mood is stable with fluphenazine every 21 days via injection.Significant debility due to parkinson's disease. No recent falls. Continue Sinemet. Skin rash is stable with periodic treatment with topical clindamycin.   Family/ staff Communication: nursing  Labs/tests ordered:  none

## 2022-11-01 ENCOUNTER — Encounter: Payer: Self-pay | Admitting: Nurse Practitioner

## 2022-11-01 ENCOUNTER — Non-Acute Institutional Stay (INDEPENDENT_AMBULATORY_CARE_PROVIDER_SITE_OTHER): Payer: Medicare Other | Admitting: Nurse Practitioner

## 2022-11-01 DIAGNOSIS — Z0000 Encounter for general adult medical examination without abnormal findings: Secondary | ICD-10-CM

## 2022-11-01 NOTE — Progress Notes (Signed)
Subjective:   Cheryl Cummings is a 82 y.o. female who presents for Medicare Annual (Subsequent) preventive examination.  Review of Systems     Cardiac Risk Factors include: advanced age (>30men, >83 women);sedentary lifestyle;hypertension     Objective:    Today's Vitals   11/01/22 1516 11/01/22 1521  BP: (!) 154/89 124/76  Pulse: 85   Resp: 18   Temp: 97.9 F (36.6 C)   SpO2: 96%   Weight: 106 lb 12.8 oz (48.4 kg)   Height:  (1.626 m)    Body mass index is 18.33 kg/m.     11/01/2022    3:20 PM 10/05/2022   10:07 AM 08/14/2022    2:19 PM 07/11/2022   10:16 AM 07/10/2022   11:57 AM 05/10/2022   12:05 PM 11/05/2021    5:05 AM  Advanced Directives  Does Patient Have a Medical Advance Directive? Yes Yes Yes Yes Yes Yes No  Type of Advance Directive Out of facility DNR (pink MOST or yellow form) Out of facility DNR (pink MOST or yellow form) Out of facility DNR (pink MOST or yellow form) Out of facility DNR (pink MOST or yellow form) Out of facility DNR (pink MOST or yellow form) Out of facility DNR (pink MOST or yellow form)   Does patient want to make changes to medical advance directive? No - Patient declined No - Patient declined No - Patient declined No - Patient declined No - Patient declined No - Patient declined   Would patient like information on creating a medical advance directive?       No - Patient declined  Pre-existing out of facility DNR order (yellow form or pink MOST form)   Yellow form placed in chart (order not valid for inpatient use)  Yellow form placed in chart (order not valid for inpatient use)      Current Medications (verified) Outpatient Encounter Medications as of 11/01/2022  Medication Sig   acetaminophen (TYLENOL) 650 MG CR tablet Take 650 mg by mouth every 4 (four) hours as needed for pain.   aluminum hydroxide (DERMAGRAN) ointment Apply 1 application  topically as directed. To coccyx/buttocks for skin protection every shift   bismuth  subsalicylate (PEPTO BISMOL) 262 MG/15ML suspension Take 30 mLs by mouth every 6 (six) hours as needed for indigestion or diarrhea or loose stools.   carbidopa-levodopa (SINEMET IR) 25-100 MG tablet Take 1 tablet by mouth 3 (three) times daily. And takes 2 tablets once daily   Emollient (CETAPHIL) cream Apply to back and shoulders topically every evening and night.   Eyelid Cleansers (OCUSOFT EYELID CLEANSING) PADS Apply 1 Application topically daily. For matted eyes   fluPHENAZine decanoate (PROLIXIN) 25 MG/ML injection Inject 2 mLs into the muscle daily. Every 21 days   guaifenesin (ROBITUSSIN) 100 MG/5ML syrup Take 200 mg by mouth every 4 (four) hours as needed for cough.   ketoconazole (NIZORAL) 2 % shampoo Apply 1 Application topically 2 (two) times a week. For dandruff every Tuesday and Friday   latanoprost (XALATAN) 0.005 % ophthalmic solution Place 1 drop into both eyes at bedtime.   nitroGLYCERIN (NITROSTAT) 0.4 MG SL tablet Place 0.4 mg under the tongue every 5 (five) minutes as needed for chest pain.   polyvinyl alcohol (ARTIFICIAL TEARS) 1.4 % ophthalmic solution Place 1 drop into both eyes as needed for dry eyes.   Saline 0.9 % SOLN Place 1 spray into the nose every hour as needed. Both nostrils   SF 5000 PLUS  1.1 % CREA dental cream Place 1 application onto teeth in the morning, at noon, and at bedtime. Brush thoroughly for 2 minutes, do not rinse after   timolol (TIMOPTIC) 0.5 % ophthalmic solution Place 1 drop into both eyes daily.   Vitamin D, Ergocalciferol, (DRISDOL) 50000 units CAPS capsule Take 1 capsule (50,000 Units total) by mouth every 30 (thirty) days.   No facility-administered encounter medications on file as of 11/01/2022.    Allergies (verified) Nsaids, Demerol [meperidine hcl], Diazepam, Ibuprofen, Meperidine, Mushroom extract complex, Other, and Shellfish allergy   History: Past Medical History:  Diagnosis Date   Dementia    Essential hypertension, benign     GERD (gastroesophageal reflux disease)    Glaucoma    Parkinson disease    Past Surgical History:  Procedure Laterality Date   INTRAMEDULLARY (IM) NAIL INTERTROCHANTERIC Left 11/03/2020   Procedure: INTRAMEDULLARY (IM) NAIL INTERTROCHANTRIC;  Surgeon: Christena Flake, MD;  Location: [facility] ORS;  Service: Orthopedics;  Laterality: Left;   LAPAROSCOPIC APPENDECTOMY N/A 03/07/2020   Procedure: APPENDECTOMY LAPAROSCOPIC;  Surgeon: Henrene Dodge, MD;  Location: [facility] ORS;  Service: General;  Laterality: N/A;   VAGINAL DELIVERY     twice   History reviewed. No pertinent family history. Social History   Socioeconomic History   Marital status: Widowed    Spouse name: Not on file   Number of children: 2   Years of education: Not on file   Highest education level: Not on file  Occupational History   Occupation: Production designer, theatre/television/film    Comment: [employer]  Tobacco Use   Smoking status: Never   Smokeless tobacco: Never  Vaping Use   Vaping Use: Not on file  Substance and Sexual Activity   Alcohol use: No   Drug use: No   Sexual activity: Not Currently  Other Topics Concern   Not on file  Social History Narrative   No living will   Would want daughter and son to make health care decisions for her   Would accept resuscitation--but no prolonged ventilation   Would not want extended tube feeds--would accept short term to determine prognosis   Social Determinants of Health   Financial Resource Strain: Not on file  Food Insecurity: Not on file  Transportation Needs: Not on file  Physical Activity: Not on file  Stress: Not on file  Social Connections: Not on file    Tobacco Counseling Counseling given: Not Answered   Clinical Intake:  Pre-visit preparation completed: Yes  Pain : No/denies pain     BMI - recorded: 18 Nutritional Status: BMI <19  Underweight Diabetes: No  How often do you need to have someone help you when you read instructions, pamphlets, or other written  materials from your doctor or pharmacy?: 5 - Always  Diabetic?no         Activities of Daily Living    11/01/2022    3:23 PM  In your present state of health, do you have any difficulty performing the following activities:  Hearing? 0  Vision? 0  Difficulty concentrating or making decisions? 1  Walking or climbing stairs? 1  Dressing or bathing? 1  Doing errands, shopping? 1  Preparing Food and eating ? Y  Using the Toilet? Y  In the past six months, have you accidently leaked urine? Y  Do you have problems with loss of bowel control? Y  Managing your Medications? Y  Managing your Finances? Y  Housekeeping or managing your Housekeeping? Jeannie Fend  Patient Care Team: Earnestine Mealing, MD as PCP - General (Family Medicine) Sharon Seller, NP as Nurse Practitioner (Geriatric Medicine)  Indicate any recent Medical Services you may have received from other than [facility] providers in the past year (date may be approximate).     Assessment:   This is a routine wellness examination for Cheryl Cummings.  Hearing/Vision screen No results found.  Dietary issues and exercise activities discussed: Current Exercise Habits: The patient does not participate in regular exercise at present   Goals Addressed   None    Depression Screen     No data to display          Fall Risk    03/14/2020    1:47 PM 02/16/2019    6:02 PM  Fall Risk   Falls in the past year? 0   Comment  Emmi Telephone Survey: data to providers prior to load  Number falls in past yr: 0   Comment  Emmi Telephone Survey Actual Response =   Injury with Fall? 0     FALL RISK PREVENTION PERTAINING TO THE HOME:  Any stairs in or around the home? No  If so, are there any without handrails?  na Home free of loose throw rugs in walkways, pet beds, electrical cords, etc? Yes  Adequate lighting in your home to reduce risk of falls? Yes   ASSISTIVE DEVICES UTILIZED TO PREVENT FALLS:  Life alert? No  Use of a cane, walker  or w/c? Yes  Grab bars in the bathroom? Yes  Shower chair or bench in shower? Yes  Elevated toilet seat or a handicapped toilet? Yes   TIMED UP AND GO:  Was the test performed? No .    Cognitive Function:        Immunizations Immunization History  Administered Date(s) Administered   Influenza-Unspecified 05/01/2022   Moderna Sars-Covid-2 Vaccination 08/24/2019, 09/21/2019, 05/25/2022   Tdap 09/14/2007, 11/05/2021   Unspecified SARS-COV-2 Vaccination 05/27/2020, 12/02/2020, 04/07/2021, 12/12/2021    TDAP status: Up to date  Flu Vaccine status: Up to date  Pneumococcal vaccine status: Due, Education has been provided regarding the importance of this vaccine. Advised may receive this vaccine at local pharmacy or Health Dept. Aware to provide a copy of the vaccination record if obtained from local pharmacy or Health Dept. Verbalized acceptance and understanding.  Covid-19 vaccine status: Information provided on how to obtain vaccines.   Qualifies for Shingles Vaccine? Yes   Zostavax completed No   Shingrix Completed?: No.    Education has been provided regarding the importance of this vaccine. Patient has been advised to call insurance company to determine out of pocket expense if they have not yet received this vaccine. Advised may also receive vaccine at local pharmacy or Health Dept. Verbalized acceptance and understanding.  Screening Tests Health Maintenance  Topic Date Due   Zoster Vaccines- Shingrix (1 of 2) Never done   Pneumonia Vaccine 70+ Years old (1 of 1 - PCV) Never done   COVID-19 Vaccine (8 - 2023-24 season) 07/20/2022   INFLUENZA VACCINE  02/14/2023   Medicare Annual Wellness (AWV)  11/01/2023   DTaP/Tdap/Td (3 - Td or Tdap) 11/06/2031   HPV VACCINES  Aged Out   DEXA SCAN  Discontinued    Health Maintenance  Health Maintenance Due  Topic Date Due   Zoster Vaccines- Shingrix (1 of 2) Never done   Pneumonia Vaccine 64+ Years old (1 of 1 - PCV) Never  done   COVID-19 Vaccine (8 -  2023-24 season) 07/20/2022    Colorectal cancer screening: No longer required.   Mammogram status: No longer required due to age.   Lung Cancer Screening: (Low Dose CT Chest recommended if Age 58-80 years, 30 pack-year currently smoking OR have quit w/in 15years.) does not qualify.   Lung Cancer Screening Referral: na  Additional Screening:  Hepatitis C Screening: does not qualify; Completed na  Vision Screening: Recommended annual ophthalmology exams for early detection of glaucoma and other disorders of the eye. Is the patient up to date with their annual eye exam?  No  Unable to complete due to dementia  Dental Screening: Recommended annual dental exams for proper oral hygiene  Community Resource Referral / Chronic Care Management: CRR required this visit?  No   CCM required this visit?  No      Plan:     I have personally reviewed and noted the following in the patient's chart:   Medical and social history Use of alcohol, tobacco or illicit drugs  Current medications and supplements including opioid prescriptions. Patient is not currently taking opioid prescriptions. Functional ability and status Nutritional status Physical activity Advanced directives List of other physicians Hospitalizations, surgeries, and ER visits in previous 12 months Vitals Screenings to include cognitive, depression, and falls Referrals and appointments  In addition, I have reviewed and discussed with patient certain preventive protocols, quality metrics, and best practice recommendations. A written personalized care plan for preventive services as well as general preventive health recommendations were provided to patient.     Sharon Seller, NP   11/01/2022   Place of service: [facility]

## 2022-11-20 ENCOUNTER — Encounter: Payer: Self-pay | Admitting: Nurse Practitioner

## 2022-11-20 ENCOUNTER — Non-Acute Institutional Stay (SKILLED_NURSING_FACILITY): Payer: Medicare Other | Admitting: Nurse Practitioner

## 2022-11-20 DIAGNOSIS — I10 Essential (primary) hypertension: Secondary | ICD-10-CM | POA: Diagnosis not present

## 2022-11-20 DIAGNOSIS — G20A1 Parkinson's disease without dyskinesia, without mention of fluctuations: Secondary | ICD-10-CM

## 2022-11-20 DIAGNOSIS — E559 Vitamin D deficiency, unspecified: Secondary | ICD-10-CM | POA: Diagnosis not present

## 2022-11-20 DIAGNOSIS — E441 Mild protein-calorie malnutrition: Secondary | ICD-10-CM | POA: Diagnosis not present

## 2022-11-20 DIAGNOSIS — F02C3 Dementia in other diseases classified elsewhere, severe, with mood disturbance: Secondary | ICD-10-CM

## 2022-11-20 NOTE — Progress Notes (Signed)
Location:  Other [place].  Nursing Home Room Number: [facility] [address] Place of Service:  SNF [phone]) Abbey Chatters, NP  PCP: Earnestine Mealing, MD  Patient Care Team: Earnestine Mealing, MD as PCP - General (Family Medicine) Sharon Seller, NP as Nurse Practitioner (Geriatric Medicine)  Extended Emergency Contact Information Primary Emergency Contact: [facility] Address: [address]          [place], [place] [address] [name] of [place] Home Phone: [phone] Work Phone: [phone] Mobile Phone: [phone] Relation: Daughter Secondary Emergency Contact: Fontes,[name] Address: [address]          [place], [place] [address] [name] of [place] Home Phone: [phone] Work Phone: [phone] Mobile Phone: [phone] Relation: Son  Goals of care: Advanced Directive information    11/20/2022    2:39 PM  Advanced Directives  Does Patient Have a Medical Advance Directive? Yes  Type of Advance Directive Out of facility DNR (pink MOST or yellow form)  Does patient want to make changes to medical advance directive? No - Patient declined     Chief Complaint  Patient presents with   Medical Management of Chronic Issues    Medical Management of Chronic Issues.     HPI:  Pt is a 82 y.o. female seen today for medical management of chronic disease. Pt at [facility] for [facility] term care.  Pt iwht hx of dementia, htn, parkinsons disease.  She is doing well. Nursing without acute concerns.    Past Medical History:  Diagnosis Date   Dementia (HCC)    Essential hypertension, benign    GERD (gastroesophageal reflux disease)    Glaucoma    Parkinson disease    Past Surgical History:  Procedure Laterality Date   INTRAMEDULLARY (IM) NAIL INTERTROCHANTERIC Left 11/03/2020   Procedure: INTRAMEDULLARY (IM) NAIL INTERTROCHANTRIC;  Surgeon: Christena Flake, MD;  Location: [facility] ORS;  Service: Orthopedics;  Laterality: Left;   LAPAROSCOPIC APPENDECTOMY N/A 03/07/2020    Procedure: APPENDECTOMY LAPAROSCOPIC;  Surgeon: Henrene Dodge, MD;  Location: [facility] ORS;  Service: General;  Laterality: N/A;   VAGINAL DELIVERY     twice    Allergies  Allergen Reactions   Nsaids    Demerol [Meperidine Hcl]    Diazepam Other (See Comments)    Unknown  Other reaction(s): Unknown Unknown Other Reaction: OTHER REACTION   Ibuprofen Swelling   Meperidine Other (See Comments)    Other Reaction: OTHER REACTION   Mushroom Extract Complex Nausea And Vomiting   Other Diarrhea    Sour Cream and Yogurt.    Shellfish Allergy Rash    Outpatient Encounter Medications as of 11/20/2022  Medication Sig   acetaminophen (TYLENOL) 650 MG CR tablet Take 650 mg by mouth every 4 (four) hours as needed for pain.   bismuth subsalicylate (PEPTO BISMOL) 262 MG/15ML suspension Take 30 mLs by mouth every 6 (six) hours as needed for indigestion or diarrhea or loose stools.   carbidopa-levodopa (SINEMET IR) 25-100 MG tablet Take 1 tablet by mouth 3 (three) times daily. And takes 2 tablets once daily   Emollient (CETAPHIL) cream Apply to back and shoulders topically every evening and night.   Eyelid Cleansers (OCUSOFT EYELID CLEANSING) PADS Apply 1 Application topically daily. For matted eyes   fluPHENAZine decanoate (PROLIXIN) 25 MG/ML injection Inject 2 mLs into the muscle daily. Every 21 days   guaifenesin (ROBITUSSIN) 100 MG/5ML syrup Take 200 mg by mouth every 4 (four) hours as needed for cough.   ketoconazole (NIZORAL) 2 %  shampoo Apply 1 Application topically 2 (two) times a week. For dandruff every Tuesday and Friday   latanoprost (XALATAN) 0.005 % ophthalmic solution Place 1 drop into both eyes at bedtime.   nitroGLYCERIN (NITROSTAT) 0.4 MG SL tablet Place 0.4 mg under the tongue every 5 (five) minutes as needed for chest pain.   polyvinyl alcohol (ARTIFICIAL TEARS) 1.4 % ophthalmic solution Place 1 drop into both eyes as needed for dry eyes.   Saline 0.9 % SOLN Place 1 spray into the  nose every hour as needed. Both nostrils   SF 5000 PLUS 1.1 % CREA dental cream Place 1 application onto teeth in the morning, at noon, and at bedtime. Brush thoroughly for 2 minutes, do not rinse after   timolol (TIMOPTIC) 0.5 % ophthalmic solution Place 1 drop into both eyes daily.   Vitamin D, Ergocalciferol, (DRISDOL) 50000 units CAPS capsule Take 1 capsule (50,000 Units total) by mouth every 30 (thirty) days.   Zinc Oxide (TRIPLE PASTE) 12.8 % ointment Apply 1 Application topically. Every shift.   [DISCONTINUED] aluminum hydroxide (DERMAGRAN) ointment Apply 1 application  topically as directed. To coccyx/buttocks for skin protection every shift   No facility-administered encounter medications on file as of 11/20/2022.    Review of Systems  Unable to perform ROS: Dementia     Immunization History  Administered Date(s) Administered   Covid-19, Mrna,Vaccine(Spikevax)78yrs and older 10/23/2022   Influenza-Unspecified 05/01/2022   Moderna Sars-Covid-2 Vaccination 08/24/2019, 09/21/2019, 05/25/2022   Tdap 09/14/2007, 11/05/2021   Unspecified SARS-COV-2 Vaccination 05/27/2020, 12/02/2020, 04/07/2021, 12/12/2021   Pertinent  Health Maintenance Due  Topic Date Due   INFLUENZA VACCINE  02/14/2023   DEXA SCAN  Discontinued      11/05/2020    4:00 AM 11/05/2020    7:40 AM 11/06/2020    4:00 AM 11/06/2020    7:52 AM 11/05/2021    5:05 AM  Fall Risk  (RETIRED) Patient Fall Risk Level High fall risk High fall risk High fall risk High fall risk High fall risk   Functional Status Survey:    Vitals:   11/20/22 1433  BP: 117/78  Pulse: 96  Resp: 18  Temp: 98 F (36.7 C)  SpO2: 90%  Weight: 106 lb 9.6 oz (48.4 kg)  Height: 5\' 4"  (1.626 m)   Body mass index is 18.3 kg/m. Physical Exam Constitutional:      General: She is not in acute distress.    Appearance: She is well-developed. She is not diaphoretic.  HENT:     Head: Normocephalic and atraumatic.     Mouth/Throat:      Pharynx: No oropharyngeal exudate.  Eyes:     Conjunctiva/sclera: Conjunctivae normal.     Pupils: Pupils are equal, round, and reactive to light.  Cardiovascular:     Rate and Rhythm: Normal rate and regular rhythm.     Heart sounds: Normal heart sounds.  Pulmonary:     Effort: Pulmonary effort is normal.     Breath sounds: Normal breath sounds.  Abdominal:     General: Bowel sounds are normal.     Palpations: Abdomen is soft.  Musculoskeletal:     Cervical back: Normal range of motion and neck supple.     Right lower leg: No edema.     Left lower leg: No edema.  Skin:    General: Skin is warm and dry.  Neurological:     Mental Status: She is alert. Mental status is at baseline.     Motor:  Weakness present.     Gait: Gait abnormal.  Psychiatric:        Mood and Affect: Mood normal.     Labs reviewed: Recent Labs    07/12/22 0000  NA 139  K 3.8  CL 102  CO2 30*  BUN 18  CREATININE 0.6  CALCIUM 8.7   No results for input(s): "AST", "ALT", "ALKPHOS", "BILITOT", "PROT", "ALBUMIN" in the last 8760 hours. Recent Labs    07/12/22 0000  WBC 5.0  NEUTROABS 3,175.00  HGB 14.2  HCT 42  PLT 214   Lab Results  Component Value Date   TSH 1.103 03/02/2016   No results found for: "HGBA1C" No results found for: "CHOL", "HDL", "LDLCALC", "LDLDIRECT", "TRIG", "CHOLHDL"  Significant Diagnostic Results in last 30 days:  No results found.  Assessment/Plan 1. Primary hypertension -Blood pressure well controlled, goal bp <140/90 Continue  dietary modifications follow metabolic panel  2. 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   3. Parkinson's disease without fluctuating manifestations, unspecified whether dyskinesia present -stable on simemet TID   4. Vitamin D deficiency Continues on supplement  5. Malnutrition of mild  degree (HCC) Weight is stable, continues on supplements and help from staff with eating.     Cheryl Cummings. Biagio Borg [facility] [phone]

## 2022-12-25 ENCOUNTER — Non-Acute Institutional Stay (SKILLED_NURSING_FACILITY): Payer: Medicare Other | Admitting: Nurse Practitioner

## 2022-12-25 ENCOUNTER — Encounter: Payer: Self-pay | Admitting: Nurse Practitioner

## 2022-12-25 DIAGNOSIS — I10 Essential (primary) hypertension: Secondary | ICD-10-CM

## 2022-12-25 DIAGNOSIS — G20A1 Parkinson's disease without dyskinesia, without mention of fluctuations: Secondary | ICD-10-CM

## 2022-12-25 DIAGNOSIS — F02C3 Dementia in other diseases classified elsewhere, severe, with mood disturbance: Secondary | ICD-10-CM

## 2022-12-25 DIAGNOSIS — Z7189 Other specified counseling: Secondary | ICD-10-CM

## 2022-12-25 DIAGNOSIS — R400 Somnolence: Secondary | ICD-10-CM | POA: Diagnosis not present

## 2022-12-25 LAB — COMPREHENSIVE METABOLIC PANEL
Albumin: 4 (ref 3.5–5.0)
Calcium: 9 (ref 8.7–10.7)
Globulin: 2.6
eGFR: 92

## 2022-12-25 LAB — BASIC METABOLIC PANEL
BUN: 24 — AB (ref 4–21)
CO2: 29 — AB (ref 13–22)
Chloride: 103 (ref 99–108)
Creatinine: 0.5 (ref 0.5–1.1)
Glucose: 92
Potassium: 4.2 mEq/L (ref 3.5–5.1)
Sodium: 138 (ref 137–147)

## 2022-12-25 LAB — CBC AND DIFFERENTIAL
HCT: 42 (ref 36–46)
Hemoglobin: 14.4 (ref 12.0–16.0)
Neutrophils Absolute: 5775
Platelets: 183 10*3/uL (ref 150–400)
WBC: 8.1

## 2022-12-25 LAB — HEPATIC FUNCTION PANEL
ALT: 15 U/L (ref 7–35)
AST: 15 (ref 13–35)
Alkaline Phosphatase: 68 (ref 25–125)
Bilirubin, Total: 0.8

## 2022-12-25 LAB — CBC: RBC: 4.51 (ref 3.87–5.11)

## 2022-12-25 NOTE — Progress Notes (Signed)
Location:  Other [place].  Nursing Home Room Number: [facility] [address] Place of Service:  SNF [phone]) Abbey Chatters, NP  [id number], Benetta Spar, MD  Patient Care Team: Earnestine Mealing, MD as PCP - General (Family Medicine) Sharon Seller, NP as Nurse Practitioner (Geriatric Medicine)  Extended Emergency Contact Information Primary Emergency Contact: [facility] Address: [address]          [place], [place] [address] [name] of [place] Home Phone: [phone] Work Phone: [phone] Mobile Phone: [phone] Relation: Daughter Secondary Emergency Contact: Koehn,[name] Address: [address]          [place], [place] [address] [name] of [place] Home Phone: [phone] Work Phone: [phone] Mobile Phone: [phone] Relation: Son  Goals of care: Advanced Directive information    12/25/2022   10:59 AM  Advanced Directives  Does Patient Have a Medical Advance Directive? Yes  Type of Advance Directive Out of facility DNR (pink MOST or yellow form)  Does patient want to make changes to medical advance directive? No - Patient declined     Chief Complaint  Patient presents with   Acute Visit    Changes in Condition.     HPI:  Pt is a 82 y.o. female seen today for an acute visit. This morning staff noted that she was leaned over and drooling around 7 am.  She has dementia and does not always follow command at baseline but would not follow commands when asked.  Right arm was flaccid at first then stiff/spastic movements. She does have hx of parkinsons disease so will have erratic movement at time.  She has slurred speech but has had this in the past as well.  She normally is able to sit her self up but now unable to do so.  Noted to have slightly elevated BP but has variable bp at baseline.  Daughter called and reports she has noticed she more lethargic in the morning and early afternoon.  Staff reports she is more at baseline at this time.    Past Medical  History:  Diagnosis Date   Dementia (HCC)    Essential hypertension, benign    GERD (gastroesophageal reflux disease)    Glaucoma    Parkinson disease    Past Surgical History:  Procedure Laterality Date   INTRAMEDULLARY (IM) NAIL INTERTROCHANTERIC Left 11/03/2020   Procedure: INTRAMEDULLARY (IM) NAIL INTERTROCHANTRIC;  Surgeon: Christena Flake, MD;  Location: [facility] ORS;  Service: Orthopedics;  Laterality: Left;   LAPAROSCOPIC APPENDECTOMY N/A 03/07/2020   Procedure: APPENDECTOMY LAPAROSCOPIC;  Surgeon: Henrene Dodge, MD;  Location: [facility] ORS;  Service: General;  Laterality: N/A;   VAGINAL DELIVERY     twice    Allergies  Allergen Reactions   Nsaids    Demerol [Meperidine Hcl]    Diazepam Other (See Comments)    Unknown  Other reaction(s): Unknown Unknown Other Reaction: OTHER REACTION   Ibuprofen Swelling   Meperidine Other (See Comments)    Other Reaction: OTHER REACTION   Mushroom Extract Complex Nausea And Vomiting   Other Diarrhea    Sour Cream and Yogurt.    Shellfish Allergy Rash    Outpatient Encounter Medications as of 12/25/2022  Medication Sig   acetaminophen (TYLENOL) 650 MG CR tablet Take 650 mg by mouth every 4 (four) hours as needed for pain.   bismuth subsalicylate (PEPTO BISMOL) 262 MG/15ML suspension Take 30 mLs by mouth every 6 (six) hours as needed for indigestion or diarrhea or loose stools.   carbidopa-levodopa (SINEMET IR)  25-100 MG tablet Take 1 tablet by mouth 3 (three) times daily. And takes 2 tablets once daily   Emollient (CETAPHIL) cream Apply to back and shoulders topically every evening and night.   Eyelid Cleansers (OCUSOFT EYELID CLEANSING) PADS Apply 1 Application topically daily. For matted eyes   fluPHENAZine decanoate (PROLIXIN) 25 MG/ML injection Inject 2 mLs into the muscle daily. Every 21 days   guaifenesin (ROBITUSSIN) 100 MG/5ML syrup Take 200 mg by mouth every 4 (four) hours as needed for cough.   ketoconazole (NIZORAL) 2 % shampoo  Apply 1 Application topically 2 (two) times a week. For dandruff every Tuesday and Friday   latanoprost (XALATAN) 0.005 % ophthalmic solution Place 1 drop into both eyes at bedtime.   nitroGLYCERIN (NITROSTAT) 0.4 MG SL tablet Place 0.4 mg under the tongue every 5 (five) minutes as needed for chest pain.   SF 5000 PLUS 1.1 % CREA dental cream Place 1 application onto teeth in the morning, at noon, and at bedtime. Brush thoroughly for 2 minutes, do not rinse after   timolol (TIMOPTIC) 0.5 % ophthalmic solution Place 1 drop into both eyes daily.   Vitamin D, Ergocalciferol, (DRISDOL) 50000 units CAPS capsule Take 1 capsule (50,000 Units total) by mouth every 30 (thirty) days.   Zinc Oxide (TRIPLE PASTE) 12.8 % ointment Apply 1 Application topically. Every shift.   [DISCONTINUED] polyvinyl alcohol (ARTIFICIAL TEARS) 1.4 % ophthalmic solution Place 1 drop into both eyes as needed for dry eyes.   [DISCONTINUED] Saline 0.9 % SOLN Place 1 spray into the nose every hour as needed. Both nostrils   No facility-administered encounter medications on file as of 12/25/2022.    Review of Systems  Unable to perform ROS: Dementia    Immunization History  Administered Date(s) Administered   Covid-19, Mrna,Vaccine(Spikevax)79yrs and older 10/23/2022   Influenza-Unspecified 05/01/2022   Moderna Covid-19 Vaccine Bivalent Booster 51yrs & up 12/12/2021, 05/25/2022   Moderna Sars-Covid-2 Vaccination 08/24/2019, 09/21/2019, 12/02/2020, 04/07/2021, 05/25/2022   PNEUMOCOCCAL CONJUGATE-20 11/26/2022   Tdap 09/14/2007, 11/05/2021   Unspecified SARS-COV-2 Vaccination 05/27/2020, 12/02/2020, 04/07/2021, 12/12/2021   Pertinent  Health Maintenance Due  Topic Date Due   INFLUENZA VACCINE  02/14/2023   DEXA SCAN  Discontinued      11/05/2020    4:00 AM 11/05/2020    7:40 AM 11/06/2020    4:00 AM 11/06/2020    7:52 AM 11/05/2021    5:05 AM  Fall Risk  (RETIRED) Patient Fall Risk Level High fall risk High fall risk  High fall risk High fall risk High fall risk   Functional Status Survey:    Vitals:   12/25/22 0929  BP: 120/76  Pulse: 76  Resp: 18  Temp: 97.6 F (36.4 C)  SpO2: 94%  Weight: 105 lb 3.2 oz (47.7 kg)  Height: 5\' 4"  (1.626 m)   Body mass index is 18.06 kg/m. Physical Exam Constitutional:      General: She is not in acute distress.    Appearance: She is well-developed. She is not diaphoretic.     Comments: Nonverbal   HENT:     Head: Normocephalic and atraumatic.     Mouth/Throat:     Pharynx: No oropharyngeal exudate.  Eyes:     Conjunctiva/sclera: Conjunctivae normal.     Pupils: Pupils are equal, round, and reactive to light.  Cardiovascular:     Rate and Rhythm: Normal rate and regular rhythm.     Heart sounds: Normal heart sounds.  Pulmonary:  Effort: Pulmonary effort is normal.     Breath sounds: Normal breath sounds.  Abdominal:     General: Bowel sounds are normal.     Palpations: Abdomen is soft.  Musculoskeletal:     Cervical back: Normal range of motion and neck supple.     Right lower leg: No edema.     Left lower leg: No edema.  Skin:    General: Skin is warm and dry.  Neurological:     Mental Status: She is alert.     Motor: Weakness present.     Gait: Gait abnormal.     Labs reviewed: Recent Labs    07/12/22 0000  NA 139  K 3.8  CL 102  CO2 30*  BUN 18  CREATININE 0.6  CALCIUM 8.7   No results for input(s): "AST", "ALT", "ALKPHOS", "BILITOT", "PROT", "ALBUMIN" in the last 8760 hours. Recent Labs    07/12/22 0000  WBC 5.0  NEUTROABS 3,175.00  HGB 14.2  HCT 42  PLT 214   Lab Results  Component Value Date   TSH 1.103 03/02/2016   No results found for: "HGBA1C" No results found for: "CHOL", "HDL", "LDLCALC", "LDLDIRECT", "TRIG", "CHOLHDL"  Significant Diagnostic Results in last 30 days:  No results found.  Assessment/Plan 1. Somnolence Does appear sleepy but wakes up when in the room.  -does not follow commands but  is awake and looks in the direction of the person talking.  -will get cbc and cmp to follow up labs  2. Primary hypertension Bp is variable. Elevated on recent checks but also will have low bps as well. Will monitor.   3. Severe dementia due to Parkinson's disease, with mood disturbance (HCC) -advanced dementia. Continues with support from staff.   4. Goals of care, counseling/discussion Discussed with daughter today, with pts progressive dementia and parkinsons disease in regards to hospitalization/comfort measures. she still would like mother to go to the hospital if absolutely needed for acute changes. Is agreeable to watch pt at facility for now and check labs.    Janene Harvey. [facility]agio Borg [facility] Adult [facility] [phone]

## 2022-12-26 ENCOUNTER — Encounter: Payer: Self-pay | Admitting: Student

## 2022-12-26 ENCOUNTER — Non-Acute Institutional Stay (SKILLED_NURSING_FACILITY): Payer: Medicare Other | Admitting: Student

## 2022-12-26 DIAGNOSIS — F02C3 Dementia in other diseases classified elsewhere, severe, with mood disturbance: Secondary | ICD-10-CM | POA: Diagnosis not present

## 2022-12-26 DIAGNOSIS — R400 Somnolence: Secondary | ICD-10-CM

## 2022-12-26 DIAGNOSIS — G20A1 Parkinson's disease without dyskinesia, without mention of fluctuations: Secondary | ICD-10-CM

## 2022-12-26 DIAGNOSIS — F22 Delusional disorders: Secondary | ICD-10-CM | POA: Diagnosis not present

## 2022-12-26 NOTE — Progress Notes (Signed)
Location:  Other [facility] Room Number: [facility] [address] Place of Service:  SNF [phone]) Provider:  Earnestine Mealing, MD  Patient Care Team: Cheryl Mealing, MD as PCP - General (Family Medicine) Cheryl Seller, NP as Nurse Practitioner (Geriatric Medicine)  Extended Emergency Contact Information Primary Emergency Contact: Cheryl Cummings Address: [address]          [place], [place] [address] [name] of [place] Home Phone: [phone] Work Phone: [phone] Mobile Phone: [phone] Relation: Daughter Secondary Emergency Contact: Cheryl Cummings Address: [address]          [place], [place] [address] [name] of [place] Home Phone: [phone] Work Phone: [phone] Mobile Phone: [phone] Relation: Son  Code Status:  DNR Goals of care: Advanced Directive information    12/28/2022    2:32 PM  Advanced Directives  Does Patient Have a Medical Advance Directive? Yes  Type of Advance Directive Out of facility DNR (pink MOST or yellow form)  Does patient want to make changes to medical advance directive? No - Patient declined  Pre-existing out of facility DNR order (yellow form or pink MOST form) Yellow form placed in chart (order not valid for inpatient use)     Chief Complaint  Patient presents with   Acute Visit    Somnolence    HPI:  Pt is a 82 y.o. female seen today for an acute visit for Somnolence  Patient is sleeping and difficult to wake up   Spoke with son regarding current status. Concern this is a reflection of her decline. His interactions with her for the last few months have been less and less. She sleeps often. Doesn't recognize him. He and his sister need to discuss, but lean towards less interventions based on her current status.   Called son and daughter no answer. Son returned phone call.   Past Medical History:  Diagnosis Date   Dementia (HCC)    Essential hypertension, benign    GERD (gastroesophageal reflux disease)     Glaucoma    Parkinson disease    Past Surgical History:  Procedure Laterality Date   INTRAMEDULLARY (IM) NAIL INTERTROCHANTERIC Left 11/03/2020   Procedure: INTRAMEDULLARY (IM) NAIL INTERTROCHANTRIC;  Surgeon: Christena Flake, MD;  Location: [facility] ORS;  Service: Orthopedics;  Laterality: Left;   LAPAROSCOPIC APPENDECTOMY N/A 03/07/2020   Procedure: APPENDECTOMY LAPAROSCOPIC;  Surgeon: Henrene Dodge, MD;  Location: [facility] ORS;  Service: General;  Laterality: N/A;   VAGINAL DELIVERY     twice    Allergies  Allergen Reactions   Nsaids    Demerol [Meperidine Hcl]    Diazepam Other (See Comments)    Unknown  Other reaction(s): Unknown Unknown Other Reaction: OTHER REACTION   Ibuprofen Swelling   Meperidine Other (See Comments)    Other Reaction: OTHER REACTION   Mushroom Extract Complex Nausea And Vomiting   Other Diarrhea    Sour Cream and Yogurt.    Shellfish Allergy Rash    Outpatient Encounter Medications as of 12/26/2022  Medication Sig   acetaminophen (TYLENOL) 650 MG CR tablet Take 650 mg by mouth every 4 (four) hours as needed for pain.   bismuth subsalicylate (PEPTO BISMOL) 262 MG/15ML suspension Take 30 mLs by mouth every 6 (six) hours as needed for indigestion or diarrhea or loose stools.   carbidopa-levodopa (SINEMET IR) 25-100 MG tablet Take 0.5 tablets by mouth 3 (three) times daily. And takes 2 tablets once daily   Emollient (CETAPHIL) cream Apply to back and shoulders topically every  evening and night.   fluPHENAZine decanoate (PROLIXIN) 25 MG/ML injection Inject 25 mg into the muscle daily. Every 21 days   guaifenesin (ROBITUSSIN) 100 MG/5ML syrup Take 200 mg by mouth every 4 (four) hours as needed for cough.   ketoconazole (NIZORAL) 2 % shampoo Apply 1 Application topically 2 (two) times a week. For dandruff every Tuesday and Friday   latanoprost (XALATAN) 0.005 % ophthalmic solution Place 1 drop into both eyes at bedtime.   nitroGLYCERIN (NITROSTAT) 0.4 MG SL tablet  Place 0.4 mg under the tongue every 5 (five) minutes as needed for chest pain.   SF 5000 PLUS 1.1 % CREA dental cream Place 1 application onto teeth in the morning, at noon, and at bedtime. Brush thoroughly for 2 minutes, do not rinse after   timolol (TIMOPTIC) 0.5 % ophthalmic solution Place 1 drop into both eyes daily.   Vitamin D, Ergocalciferol, (DRISDOL) 50000 units CAPS capsule Take 1 capsule (50,000 Units total) by mouth every 30 (thirty) days.   [DISCONTINUED] Zinc Oxide (TRIPLE PASTE) 12.8 % ointment Apply 1 Application topically. Every shift.   [DISCONTINUED] Eyelid Cleansers (OCUSOFT EYELID CLEANSING) PADS Apply 1 Application topically daily. For matted eyes   No facility-administered encounter medications on file as of 12/26/2022.    Review of Systems  Immunization History  Administered Date(s) Administered   Covid-19, Mrna,Vaccine(Spikevax)36yrs and older 10/23/2022   Influenza-Unspecified 05/01/2022   Moderna Covid-19 Vaccine Bivalent Booster 76yrs & up 12/12/2021, 05/25/2022   Moderna Sars-Covid-2 Vaccination 08/24/2019, 09/21/2019, 12/02/2020, 04/07/2021, 05/25/2022   PNEUMOCOCCAL CONJUGATE-20 11/26/2022   Tdap 09/14/2007, 11/05/2021   Unspecified SARS-COV-2 Vaccination 05/27/2020, 12/02/2020, 04/07/2021, 12/12/2021   Pertinent  Health Maintenance Due  Topic Date Due   INFLUENZA VACCINE  02/14/2023   DEXA SCAN  Discontinued      11/05/2020    4:00 AM 11/05/2020    7:40 AM 11/06/2020    4:00 AM 11/06/2020    7:52 AM 11/05/2021    5:05 AM  Fall Risk  (RETIRED) Patient Fall Risk Level High fall risk High fall risk High fall risk High fall risk High fall risk   Functional Status Survey:    Vitals:   12/26/22 1557  BP: 120/76  Pulse: 76  Resp: 18  Temp: 97.6 F (36.4 C)  SpO2: 94%  Weight: 105 lb 3.2 oz (47.7 kg)  Height: 5\' 4"  (1.626 m)   Body mass index is 18.06 kg/m. Physical Exam Constitutional:      Comments: Sleeping in her chair curled into fetal  position, opens eyes to voice, however, is not speaking.  Eyes:     Pupils: Pupils are equal, round, and reactive to light.  Cardiovascular:     Rate and Rhythm: Normal rate.     Pulses: Normal pulses.  Pulmonary:     Effort: Pulmonary effort is normal.  Abdominal:     General: Abdomen is flat. Bowel sounds are normal.     Palpations: Abdomen is soft.  Skin:    General: Skin is warm and dry.     Labs reviewed: Recent Labs    07/12/22 0000 12/25/22 0000  NA 139 138  K 3.8 4.2  CL 102 103  CO2 30* 29*  BUN 18 24*  CREATININE 0.6 0.5  CALCIUM 8.7 9.0   Recent Labs    12/25/22 0000  AST 15  ALT 15  ALKPHOS 68  ALBUMIN 4.0   Recent Labs    07/12/22 0000 12/25/22 0000  WBC 5.0 8.1  NEUTROABS  3,175.00 5,775.00  HGB 14.2 14.4  HCT 42 42  PLT 214 183   Lab Results  Component Value Date   TSH 1.103 03/02/2016   No results found for: "HGBA1C" No results found for: "CHOL", "HDL", "LDLCALC", "LDLDIRECT", "TRIG", "CHOLHDL"  Significant Diagnostic Results in last 30 days:  No results found.  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 Discussed with family concern medications vs progression of disease could all be a driving force for the acute change. Decrease sinimet by half and dose of fluphenazine. Concern that fluphenazine is a long acting drug that could take weeks to clear out. Will plan to push fluids for the next 3 days. If no improvement will collect urinalysis and culture. BMP and CBC unremarkable.   Family/ staff Communication: nursing, son  Labs/tests ordered:  none

## 2022-12-28 ENCOUNTER — Encounter: Payer: Self-pay | Admitting: Student

## 2022-12-28 ENCOUNTER — Non-Acute Institutional Stay (SKILLED_NURSING_FACILITY): Payer: Medicare Other | Admitting: Student

## 2022-12-28 DIAGNOSIS — G20A1 Parkinson's disease without dyskinesia, without mention of fluctuations: Secondary | ICD-10-CM | POA: Diagnosis not present

## 2022-12-28 DIAGNOSIS — R400 Somnolence: Secondary | ICD-10-CM | POA: Diagnosis not present

## 2022-12-28 DIAGNOSIS — Z7189 Other specified counseling: Secondary | ICD-10-CM | POA: Diagnosis not present

## 2022-12-28 DIAGNOSIS — F02C3 Dementia in other diseases classified elsewhere, severe, with mood disturbance: Secondary | ICD-10-CM

## 2022-12-28 DIAGNOSIS — F22 Delusional disorders: Secondary | ICD-10-CM

## 2022-12-28 NOTE — Progress Notes (Signed)
[facility] Room Number: 204 A Place of Service:  SNF [phone]) Provider:  Judeth Horn, MD  Patient Care Team: Earnestine Mealing, MD as PCP - General (Family Medicine) Sharon Seller, NP as Nurse Practitioner (Geriatric Medicine)  Extended Emergency Contact Information Primary Emergency Contact: [facility] Address: [address]          [place], [place] [address] [name] Home Phone: [phone] Work Phone: [phone] Mobile Phone: [phone] Relation: Daughter Secondary Emergency Contact: Strohmeyer,[name] Address: [address]          [place], [place] [address] [name] of [place] Home Phone: [phone] Work Phone: [phone] Mobile Phone: [phone] Relation: Son  Code Status:  DNR Goals of care: Advanced Directive information    12/28/2022    2:32 PM  Advanced Directives  Does Patient Have a Medical Advance Directive? Yes  Type of Advance Directive Out of facility DNR (pink MOST or yellow form)  Does patient want to make changes to medical advance directive? No - Patient declined  Pre-existing out of facility DNR order (yellow form or pink MOST form) Yellow form placed in chart (order not valid for inpatient use)     Chief Complaint  Patient presents with   Acute Visit    Goals of Care     HPI:  Pt is a 82 y.o. female seen today for medical management of chronic diseases.    Spoke with patient's son and HCPOA. Discussed concern that given patient's age and current health, she has poor prognosis for recovery regardless of the cause of her change in mental status. Labs have been normal. Discussed starting 1L of IV fluid per day for the next 3 days and see if patient has improvement. Also discussed possible UTI.  Receives an injection of her medication and wondering how that contributes -- she had it on the 8th and noted the difference was on Tuesday the 12th. Discussed concern this can take weeks to  improve. Discussed concern for possible stroke and how this would or would not change our management as she appears comfortable at this time. Patient's son states they would not want to do any drastic measures such as a surgery, as he fears she would not survive. Discussed medication changes and offered an outpatient CT scan if they would like further information.    Past Medical History:  Diagnosis Date   Dementia (HCC)    Essential hypertension, benign    GERD (gastroesophageal reflux disease)    Glaucoma    Parkinson disease    Past Surgical History:  Procedure Laterality Date   INTRAMEDULLARY (IM) NAIL INTERTROCHANTERIC Left 11/03/2020   Procedure: INTRAMEDULLARY (IM) NAIL INTERTROCHANTRIC;  Surgeon: Christena Flake, MD;  Location: [facility] ORS;  Service: Orthopedics;  Laterality: Left;   LAPAROSCOPIC APPENDECTOMY N/A 03/07/2020   Procedure: APPENDECTOMY LAPAROSCOPIC;  Surgeon: Henrene Dodge, MD;  Location: [facility] ORS;  Service: General;  Laterality: N/A;   VAGINAL DELIVERY     twice    Allergies  Allergen Reactions   Nsaids    Demerol [Meperidine Hcl]    Diazepam Other (See Comments)    Unknown  Other reaction(s): Unknown Unknown Other Reaction: OTHER REACTION   Ibuprofen Swelling   Meperidine Other (See Comments)    Other Reaction: OTHER REACTION   Mushroom Extract Complex Nausea And Vomiting   Other Diarrhea    Sour Cream and Yogurt.    Shellfish Allergy Rash    Outpatient Encounter Medications as  of 12/28/2022  Medication Sig   acetaminophen (TYLENOL) 650 MG CR tablet Take 650 mg by mouth every 4 (four) hours as needed for pain.   bismuth subsalicylate (PEPTO BISMOL) 262 MG/15ML suspension Take 30 mLs by mouth every 6 (six) hours as needed for indigestion or diarrhea or loose stools.   carbidopa-levodopa (SINEMET IR) 25-100 MG tablet Take 0.5 tablets by mouth 3 (three) times daily. And takes 2 tablets once daily   Emollient (CETAPHIL) cream Apply to back and shoulders  topically every evening and night.   fluPHENAZine decanoate (PROLIXIN) 25 MG/ML injection Inject 25 mg into the muscle daily. Every 21 days   guaifenesin (ROBITUSSIN) 100 MG/5ML syrup Take 200 mg by mouth every 4 (four) hours as needed for cough.   ketoconazole (NIZORAL) 2 % shampoo Apply 1 Application topically 2 (two) times a week. For dandruff every Tuesday and Friday   latanoprost (XALATAN) 0.005 % ophthalmic solution Place 1 drop into both eyes at bedtime.   nitroGLYCERIN (NITROSTAT) 0.4 MG SL tablet Place 0.4 mg under the tongue every 5 (five) minutes as needed for chest pain.   SF 5000 PLUS 1.1 % CREA dental cream Place 1 application onto teeth in the morning, at noon, and at bedtime. Brush thoroughly for 2 minutes, do not rinse after   timolol (TIMOPTIC) 0.5 % ophthalmic solution Place 1 drop into both eyes daily.   Vitamin D, Ergocalciferol, (DRISDOL) 50000 units CAPS capsule Take 1 capsule (50,000 Units total) by mouth every 30 (thirty) days.   [DISCONTINUED] Zinc Oxide (TRIPLE PASTE) 12.8 % ointment Apply 1 Application topically. Every shift.   No facility-administered encounter medications on file as of 12/28/2022.    Review of Systems  Immunization History  Administered Date(s) Administered   Covid-19, Mrna,Vaccine(Spikevax)70yrs and older 10/23/2022   Influenza-Unspecified 05/01/2022   Moderna Covid-19 Vaccine Bivalent Booster 73yrs & up 12/12/2021, 05/25/2022   Moderna Sars-Covid-2 Vaccination 08/24/2019, 09/21/2019, 12/02/2020, 04/07/2021, 05/25/2022   PNEUMOCOCCAL CONJUGATE-20 11/26/2022   Tdap 09/14/2007, 11/05/2021   Unspecified SARS-COV-2 Vaccination 05/27/2020, 12/02/2020, 04/07/2021, 12/12/2021   Pertinent  Health Maintenance Due  Topic Date Due   INFLUENZA VACCINE  02/14/2023   DEXA SCAN  Discontinued      11/05/2020    4:00 AM 11/05/2020    7:40 AM 11/06/2020    4:00 AM 11/06/2020    7:52 AM 11/05/2021    5:05 AM  Fall Risk  (RETIRED) Patient Fall Risk Level  High fall risk High fall risk High fall risk High fall risk High fall risk   Functional Status Survey:    Vitals:   12/28/22 1428  BP: 120/76  Pulse: 76  Weight: 105 lb 3.2 oz (47.7 kg)  Height: 5\' 4"  (1.626 m)   Body mass index is 18.06 kg/m. Physical Exam Eyes:     Comments: Equal and reactive without tracking  Cardiovascular:     Rate and Rhythm: Normal rate.     Pulses: Normal pulses.  Pulmonary:     Effort: Pulmonary effort is normal.     Breath sounds: Normal breath sounds.  Abdominal:     General: Abdomen is flat.     Palpations: Abdomen is soft.  Neurological:     Mental Status: She is alert. She is disoriented.     Comments: Intermittently somnolent, not interacting at this time     Labs reviewed: Recent Labs    07/12/22 0000 12/25/22 0000  NA 139 138  K 3.8 4.2  CL 102 103  CO2  30* 29*  BUN 18 24*  CREATININE 0.6 0.5  CALCIUM 8.7 9.0   Recent Labs    12/25/22 0000  AST 15  ALT 15  ALKPHOS 68  ALBUMIN 4.0   Recent Labs    07/12/22 0000 12/25/22 0000  WBC 5.0 8.1  NEUTROABS 3,175.00 5,775.00  HGB 14.2 14.4  HCT 42 42  PLT 214 183   Lab Results  Component Value Date   TSH 1.103 03/02/2016   No results found for: "HGBA1C" No results found for: "CHOL", "HDL", "LDLCALC", "LDLDIRECT", "TRIG", "CHOLHDL"  Significant Diagnostic Results in last 30 days:  No results found.  Assessment/Plan Somnolence  Severe dementia due to Parkinson's disease, with mood disturbance (HCC)  Goals of care, counseling/discussion  Delusional disorder (HCC) Discussed at length patient's stat at this time as well as the time leading up to this point. While her son has concern about her current state he understands that this very well could be an aspect of progression of her underlying disease. Discussed concern that at the end of life in dementia there is discontinuation of eating -- FT is not an option. The time can be unpredictable. Will plan to start 1LNS  at 150/hr daily for 3 days to help with hydration as patient has not been eating and drinking well.If no improvement, will discuss comfort-based measures. Collect UA and culture today. Treat if indicated. Family defers further imaging at this time.    Family/ staff Communication: son, nursing  Labs/tests ordered:  UA Cx.   I spent greater than 45 minutes for the care of this patient in face to face time, chart review, clinical documentation, patient education.

## 2023-01-01 ENCOUNTER — Non-Acute Institutional Stay (SKILLED_NURSING_FACILITY): Payer: Medicare Other | Admitting: Nurse Practitioner

## 2023-01-01 ENCOUNTER — Encounter: Payer: Self-pay | Admitting: Nurse Practitioner

## 2023-01-01 DIAGNOSIS — Z7189 Other specified counseling: Secondary | ICD-10-CM | POA: Diagnosis not present

## 2023-01-01 DIAGNOSIS — R400 Somnolence: Secondary | ICD-10-CM

## 2023-01-01 DIAGNOSIS — G20A1 Parkinson's disease without dyskinesia, without mention of fluctuations: Secondary | ICD-10-CM

## 2023-01-01 DIAGNOSIS — F02C3 Dementia in other diseases classified elsewhere, severe, with mood disturbance: Secondary | ICD-10-CM | POA: Diagnosis not present

## 2023-01-01 NOTE — Progress Notes (Signed)
Location:  Other [facility].  Nursing Home Room Number: [facility] [address] Place of Service:  SNF [phone]) Abbey Chatters, NP  PCP: Earnestine Mealing, MD  Patient Care Team: Earnestine Mealing, MD as PCP - General (Family Medicine) Sharon Seller, NP as Nurse Practitioner (Geriatric Medicine)  Extended Emergency Contact Information Primary Emergency Contact: [facility] Address: [address]          [place], [place] [address] [name] of [place] Home Phone: [phone] Work Phone: [phone] Mobile Phone: [phone] Relation: Daughter Secondary Emergency Contact: Billard,[name] Address: [address]          [place], [place] [address] [name] of [place] Home Phone: [phone] Work Phone: [phone] Mobile Phone: [phone] Relation: Son  Goals of care: Advanced Directive information    01/01/2023   12:36 PM  Advanced Directives  Does Patient Have a Medical Advance Directive? Yes  Type of Advance Directive Out of facility DNR (pink MOST or yellow form)  Does patient want to make changes to medical advance directive? No - Patient declined     Chief Complaint  Patient presents with   Acute Visit    Somnolence no better.     HPI:  Pt is a 82 y.o. female seen today for an acute visit for Somnolence No Better.  Continues to be treated for UTI (has been on antibiotic for 3 days).  She was given IV fluids as well. She sits in gerichair with her neck and head down. She is able to lift her head and does not follow commands. Staff reports she eats minimally but is eating.  No skin breakdown noted.  Called son to update   Past Medical History:  Diagnosis Date   Dementia (HCC)    Essential hypertension, benign    GERD (gastroesophageal reflux disease)    Glaucoma    Parkinson disease    Past Surgical History:  Procedure Laterality Date   INTRAMEDULLARY (IM) NAIL INTERTROCHANTERIC Left 11/03/2020   Procedure: INTRAMEDULLARY (IM) NAIL INTERTROCHANTRIC;  Surgeon:  Christena Flake, MD;  Location: [facility] OR[facility];  Service: Orthopedics;  Laterality: Left;   LAPAROSCOPIC APPENDECTOMY N/A 03/07/2020   Procedure: APPENDECTOMY LAPAROSCOPIC;  Surgeon: Henrene Dodge, MD;  Location: [facility] ORS;  Service: General;  Laterality: N/A;   VAGINAL DELIVERY     twice    Allergies  Allergen Reactions   Nsaids    Demerol [Meperidine Hcl]    Diazepam Other (See Comments)    Unknown  Other reaction(s): Unknown Unknown Other Reaction: OTHER REACTION   Ibuprofen Swelling   Meperidine Other (See Comments)    Other Reaction: OTHER REACTION   Mushroom Extract Complex Nausea And Vomiting   Other Diarrhea    Sour Cream and Yogurt.    Shellfish Allergy Rash    Outpatient Encounter Medications as of 01/01/2023  Medication Sig   acetaminophen (TYLENOL) 650 MG CR tablet Take 650 mg by mouth every 4 (four) hours as needed for pain.   bismuth subsalicylate (PEPTO BISMOL) 262 MG/15ML suspension Take 30 mLs by mouth every 6 (six) hours as needed for indigestion or diarrhea or loose stools.   carbidopa-levodopa (SINEMET IR) 25-100 MG tablet Take 0.5 tablets by mouth 3 (three) times daily. And takes 2 tablets once daily   Emollient (CETAPHIL) cream Apply to back and shoulders topically every evening and night.   fluPHENAZine decanoate (PROLIXIN) 25 MG/ML injection Inject 25 mg into the muscle daily. Every 21 days   guaifenesin (ROBITUSSIN) 100 MG/5ML syrup Take 200 mg by  mouth every 4 (four) hours as needed for cough.   ketoconazole (NIZORAL) 2 % shampoo Apply 1 Application topically 2 (two) times a week. For dandruff every Tuesday and Friday   latanoprost (XALATAN) 0.005 % ophthalmic solution Place 1 drop into both eyes at bedtime.   nitroGLYCERIN (NITROSTAT) 0.4 MG SL tablet Place 0.4 mg under the tongue every 5 (five) minutes as needed for chest pain.   SF 5000 PLUS 1.1 % CREA dental cream Place 1 application onto teeth in the morning, at noon, and at bedtime. Brush thoroughly for 2  minutes, do not rinse after   sulfamethoxazole-trimethoprim (BACTRIM DS) 800-160 MG tablet Take 1 tablet by mouth 2 (two) times daily.   timolol (TIMOPTIC) 0.5 % ophthalmic solution Place 1 drop into both eyes daily.   Vitamin D, Ergocalciferol, (DRISDOL) 50000 units CAPS capsule Take 1 capsule (50,000 Units total) by mouth every 30 (thirty) days.   No facility-administered encounter medications on file as of 01/01/2023.    Review of Systems  Unable to perform ROS: Dementia    Immunization History  Administered Date(s) Administered   Covid-19, Mrna,Vaccine(Spikevax)82yrs and older 10/23/2022   Influenza-Unspecified 05/01/2022   Moderna Covid-19 Vaccine Bivalent Booster 86yrs & up 12/12/2021, 05/25/2022   Moderna Sars-Covid-2 Vaccination 08/24/2019, 09/21/2019, 12/02/2020, 04/07/2021, 05/25/2022   PNEUMOCOCCAL CONJUGATE-20 11/26/2022   Tdap 09/14/2007, 11/05/2021   Unspecified SARS-COV-2 Vaccination 05/27/2020, 12/02/2020, 04/07/2021, 12/12/2021   Pertinent  Health Maintenance Due  Topic Date Due   INFLUENZA VACCINE  02/14/2023   DEXA SCAN  Discontinued      11/05/2020    4:00 AM 11/05/2020    7:40 AM 11/06/2020    4:00 AM 11/06/2020    7:52 AM 11/05/2021    5:05 AM  Fall Risk  (RETIRED) Patient Fall Risk Level High fall risk High fall risk High fall risk High fall risk High fall risk   Functional Status Survey:    Vitals:   01/01/23 1227  BP: 121/87  Pulse: 83  Resp: 18  Temp: (!) 97 F (36.1 C)  SpO2: 95%  Weight: 105 lb 3.2 oz (47.7 kg)  Height: 5\' 4"  (1.626 m)   Body mass index is 18.06 kg/m. Physical Exam Constitutional:      Comments: Frail female in NAD.   HENT:     Head: Atraumatic.     Nose: No congestion.     Mouth/Throat:     Mouth: Mucous membranes are moist.  Cardiovascular:     Rate and Rhythm: Normal rate and regular rhythm.  Pulmonary:     Effort: Pulmonary effort is normal.     Breath sounds: Normal breath sounds.  Abdominal:     General:  Abdomen is flat.     Palpations: Abdomen is soft.  Musculoskeletal:     Cervical back: Normal range of motion.     Right lower leg: No edema.     Left lower leg: No edema.  Skin:    General: Skin is warm and dry.  Neurological:     Mental Status: She is lethargic.     Motor: Weakness present.     Gait: Gait abnormal.     Labs reviewed: Recent Labs    07/12/22 0000 12/25/22 0000  NA 139 138  K 3.8 4.2  CL 102 103  CO2 30* 29*  BUN 18 24*  CREATININE 0.6 0.5  CALCIUM 8.7 9.0   Recent Labs    12/25/22 0000  AST 15  ALT 15  ALKPHOS 68  ALBUMIN 4.0   Recent Labs    07/12/22 0000 12/25/22 0000  WBC 5.0 8.1  NEUTROABS 3,175.00 5,775.00  HGB 14.2 14.4  HCT 42 42  PLT 214 183   Lab Results  Component Value Date   TSH 1.103 03/02/2016   No results found for: "HGBA1C" No results found for: "CHOL", "HDL", "LDLCALC", "LDLDIRECT", "TRIG", "CHOLHDL"  Significant Diagnostic Results in last 30 days:  No results found.  Assessment/Plan 1. Severe dementia due to Parkinson's disease, with mood disturbance (HCC) -ongoing progression of the disease. No improvement with antibiotic or fluids. Family agreeable for comfort care. Does not want er to be sent to hospital and focus on comfort during this time.  - Ambulatory referral to Hospice  2. Somnolence Ongoing, due to progression of dementia.  - Ambulatory referral to Hospice  3. Goals of care, counseling/discussion - Ambulatory referral to [facility] [phone]

## 2023-01-10 ENCOUNTER — Other Ambulatory Visit: Payer: Self-pay | Admitting: Nurse Practitioner

## 2023-01-10 DIAGNOSIS — Z515 Encounter for palliative care: Secondary | ICD-10-CM

## 2023-01-10 MED ORDER — MORPHINE SULFATE (CONCENTRATE) 20 MG/ML PO SOLN: 2.5000 mg | ORAL | 0 refills | Status: DC | PRN

## 2023-01-14 ENCOUNTER — Non-Acute Institutional Stay (SKILLED_NURSING_FACILITY): Admitting: Student

## 2023-01-14 ENCOUNTER — Encounter: Payer: Self-pay | Admitting: Student

## 2023-01-14 DIAGNOSIS — E441 Mild protein-calorie malnutrition: Secondary | ICD-10-CM

## 2023-01-14 DIAGNOSIS — G20A1 Parkinson's disease without dyskinesia, without mention of fluctuations: Secondary | ICD-10-CM | POA: Diagnosis not present

## 2023-01-14 DIAGNOSIS — I10 Essential (primary) hypertension: Secondary | ICD-10-CM

## 2023-01-14 DIAGNOSIS — F22 Delusional disorders: Secondary | ICD-10-CM

## 2023-01-14 DIAGNOSIS — F02C3 Dementia in other diseases classified elsewhere, severe, with mood disturbance: Secondary | ICD-10-CM

## 2023-01-14 NOTE — Progress Notes (Unsigned)
Location:  Other [facility].  Nursing Home Room Number: [facility] 204A Place of Service:  SNF [phone]) Provider:  Earnestine Mealing, MD  Patient Care Team: Earnestine Mealing, MD as PCP - General (Family Medicine) Sharon Seller, NP as Nurse Practitioner (Geriatric Medicine)  Extended Emergency Contact Information Primary Emergency Contact: [facility] Address: [address]          [place], [place] [address] [name] of [place] Home Phone: [phone] Work Phone: [phone] Mobile Phone: [phone] Relation: Daughter Secondary Emergency Contact: Leppert,[name] Address: [address]          [place], [place] [address] [name] of [place] Home Phone: [phone] Work Phone: [phone] Mobile Phone: [phone] Relation: Son  Code Status:  DNR Goals of care: Advanced Directive information    01/14/2023    9:23 AM  Advanced Directives  Does Patient Have a Medical Advance Directive? Yes  Type of Advance Directive Out of facility DNR (pink MOST or yellow form)  Does patient want to make changes to medical advance directive? No - Patient declined     Chief Complaint  Patient presents with   Medical Management of Chronic Issues    Medical Management of Chronic Issues.     HPI:  Pt is a 82 y.o. Cummings seen today for medical management of chronic diseases.    Paitent is sleeping in bed. Opens eyes to name, but does not speak.   Nursing has continued to try feeding patient. She packs food in her mouth without swallowing. Periodically adequately responds. Would like to stop weighing if possible.    Past Medical History:  Diagnosis Date   Dementia (HCC)    Essential hypertension, benign    GERD (gastroesophageal reflux disease)    Glaucoma    Parkinson disease    Past Surgical History:  Procedure Laterality Date   INTRAMEDULLARY (IM) NAIL INTERTROCHANTERIC Left 11/03/2020   Procedure: INTRAMEDULLARY (IM) NAIL INTERTROCHANTRIC;  Surgeon: Christena Flake, MD;   Location: [facility] ORS;  Service: Orthopedics;  Laterality: Left;   LAPAROSCOPIC APPENDECTOMY N/A 03/07/2020   Procedure: APPENDECTOMY LAPAROSCOPIC;  Surgeon: Henrene Dodge, MD;  Location: [facility] ORS;  Service: General;  Laterality: N/A;   VAGINAL DELIVERY     twice    Allergies  Allergen Reactions   Nsaids    Demerol [Meperidine Hcl]    Diazepam Other (See Comments)    Unknown  Other reaction(s): Unknown Unknown Other Reaction: OTHER REACTION   Ibuprofen Swelling   Meperidine Other (See Comments)    Other Reaction: OTHER REACTION   Mushroom Extract Complex Nausea And Vomiting   Other Diarrhea    Sour Cream and Yogurt.    Shellfish Allergy Rash    Outpatient Encounter Medications as of 01/14/2023  Medication Sig   acetaminophen (TYLENOL) 650 MG CR tablet Take 650 mg by mouth every 4 (four) hours as needed for pain.   bismuth subsalicylate (PEPTO BISMOL) 262 MG/15ML suspension Take 30 mLs by mouth every 6 (six) hours as needed for indigestion or diarrhea or loose stools.   carbidopa-levodopa (SINEMET IR) 25-100 MG tablet Take 0.5 tablets by mouth 3 (three) times daily. And takes 2 tablets once daily   Emollient (CETAPHIL) cream Apply to back and shoulders topically every evening and night.   fluPHENAZine decanoate (PROLIXIN) 25 MG/ML injection Inject 25 mg into the muscle daily. Every 21 days   guaifenesin (ROBITUSSIN) 100 MG/5ML syrup Take 10 mLs by mouth every 4 (four) hours as needed for cough.   latanoprost (XALATAN)  0.005 % ophthalmic solution Place 1 drop into both eyes at bedtime.   morphine (ROXANOL) 20 MG/ML concentrated solution Take 0.13 mLs (2.6 mg total) by mouth every 4 (four) hours as needed for severe pain.   nitroGLYCERIN (NITROSTAT) 0.4 MG SL tablet Place 0.4 mg under the tongue every 5 (five) minutes as needed for chest pain.   SF 5000 PLUS 1.1 % CREA dental cream Place 1 application onto teeth in the morning, at noon, and at bedtime. Brush thoroughly for 2 minutes, do  not rinse after   timolol (TIMOPTIC) 0.5 % ophthalmic solution Place 1 drop into both eyes daily.   Vitamin D, Ergocalciferol, (DRISDOL) 50000 units CAPS capsule Take 1 capsule (50,000 Units total) by mouth every 30 (thirty) days.   Zinc Oxide (TRIPLE PASTE) 12.8 % ointment Apply 1 Application topically. Every shift.   [DISCONTINUED] ketoconazole (NIZORAL) 2 % shampoo Apply 1 Application topically 2 (two) times a week. For dandruff every Tuesday and Friday   [DISCONTINUED] sulfamethoxazole-trimethoprim (BACTRIM DS) 800-160 MG tablet Take 1 tablet by mouth 2 (two) times daily.   No facility-administered encounter medications on file as of 01/14/2023.    Review of Systems  Immunization History  Administered Date(s) Administered   Covid-19, Mrna,Vaccine(Spikevax)10yrs and older 10/23/2022   Influenza-Unspecified 05/01/2022   Moderna Covid-19 Vaccine Bivalent Booster 83yrs & up 12/12/2021, 05/25/2022   Moderna Sars-Covid-2 Vaccination 08/24/2019, 09/21/2019, 12/02/2020, 04/07/2021, 05/25/2022   PNEUMOCOCCAL CONJUGATE-20 11/26/2022   Tdap 09/14/2007, 11/05/2021   Unspecified SARS-COV-2 Vaccination 05/27/2020, 12/02/2020, 04/07/2021, 12/12/2021   Pertinent  Health Maintenance Due  Topic Date Due   INFLUENZA VACCINE  02/14/2023   DEXA SCAN  Discontinued      11/05/2020    4:00 AM 11/05/2020    7:40 AM 11/06/2020    4:00 AM 11/06/2020    7:52 AM 11/05/2021    5:05 AM  Fall Risk  (RETIRED) Patient Fall Risk Level High fall risk High fall risk High fall risk High fall risk High fall risk   Functional Status Survey:    Vitals:   01/14/23 0915  BP: 114/62  Pulse: 76  Resp: 16  Temp: 97.8 F (36.6 C)  SpO2: 94%  Weight: 105 lb 3.2 oz (47.7 kg)  Height: 5\' 4"  (1.626 m)   Body mass index is 18.06 kg/m. Physical Exam HENT:     Mouth/Throat:     Comments: Food caked in mouth Cardiovascular:     Rate and Rhythm: Normal rate.     Pulses: Normal pulses.  Pulmonary:     Effort:  Pulmonary effort is normal.  Abdominal:     General: Abdomen is flat.     Palpations: Abdomen is soft.  Neurological:     Mental Status: She is alert.     Comments: Alert but non-responsive.      Labs reviewed: Recent Labs    07/12/22 0000 12/25/22 0000  NA 139 138  K 3.8 4.2  CL 102 103  CO2 30* 29*  BUN 18 24*  CREATININE 0.6 0.5  CALCIUM 8.7 9.0   Recent Labs    12/25/22 0000  AST 15  ALT 15  ALKPHOS 68  ALBUMIN 4.0   Recent Labs    07/12/22 0000 12/25/22 0000  WBC 5.0 8.1  NEUTROABS 3,175.00 5,775.00  HGB 14.2 14.4  HCT 42 42  PLT 214 183   Lab Results  Component Value Date   TSH 1.103 03/02/2016   No results found for: "HGBA1C" No results found for: "  CHOL", "HDL", "LDLCALC", "LDLDIRECT", "TRIG", "CHOLHDL"  Significant Diagnostic Results in last 30 days:  No results found.  Assessment/Plan Severe dementia due to Parkinson's disease, with mood disturbance (HCC)  Malnutrition of mild degree (HCC)  Delusional disorder (HCC)  Primary hypertension Patient continues to have decline. Discontinue weights at this time. Continue essential comfort-focused medications. Provide supportive care.   Family/ staff Communication: nursing  Labs/tests ordered:  none

## 2023-02-14 DEATH — deceased
